# Patient Record
Sex: Female | Born: 1995
Health system: Southern US, Community
[De-identification: ages and names within clinical notes are randomized; demographics above are authoritative.]

## PROBLEM LIST (undated history)

## (undated) ENCOUNTER — Inpatient Hospital Stay (HOSPITAL_COMMUNITY): Payer: Self-pay

## (undated) DIAGNOSIS — A749 Chlamydial infection, unspecified: Secondary | ICD-10-CM

## (undated) DIAGNOSIS — G43909 Migraine, unspecified, not intractable, without status migrainosus: Secondary | ICD-10-CM

## (undated) HISTORY — DX: Chlamydial infection, unspecified: A74.9

## (undated) HISTORY — PX: APPENDECTOMY: SHX54

## (undated) HISTORY — PX: OTHER SURGICAL HISTORY: SHX169

---
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
## 2016-01-07 ENCOUNTER — Other Ambulatory Visit (HOSPITAL_COMMUNITY)
Admission: RE | Admit: 2016-01-07 | Discharge: 2016-01-07 | Disposition: A | Discharge status: Home / Self Care | Payer: Medicaid Other | Source: Ambulatory Visit | Attending: Family Medicine | Admitting: Family Medicine

## 2016-01-07 ENCOUNTER — Encounter: Payer: Self-pay | Admitting: Family Medicine

## 2016-01-07 ENCOUNTER — Ambulatory Visit (INDEPENDENT_AMBULATORY_CARE_PROVIDER_SITE_OTHER): Payer: Medicaid Other | Admitting: Family Medicine

## 2016-01-07 VITALS — Ht 69.0 in | Wt 194.0 lb

## 2016-01-07 DIAGNOSIS — N898 Other specified noninflammatory disorders of vagina: Secondary | ICD-10-CM | POA: Diagnosis not present

## 2016-01-07 DIAGNOSIS — Z113 Encounter for screening for infections with a predominantly sexual mode of transmission: Secondary | ICD-10-CM | POA: Insufficient documentation

## 2016-01-07 DIAGNOSIS — R309 Painful micturition, unspecified: Secondary | ICD-10-CM

## 2016-01-07 LAB — POCT URINALYSIS DIPSTICK
Bilirubin, UA: NEGATIVE
Glucose, UA: NEGATIVE
Ketones, UA: 15
Leukocytes, UA: NEGATIVE
NITRITE UA: NEGATIVE
Protein, UA: NEGATIVE
Spec Grav, UA: 1.025
UROBILINOGEN UA: 0.2
pH, UA: 6

## 2016-01-07 LAB — POCT UA - MICROSCOPIC ONLY

## 2016-01-07 LAB — POCT WET PREP (WET MOUNT): CLUE CELLS WET PREP WHIFF POC: NEGATIVE

## 2016-01-07 NOTE — Patient Instructions (Signed)

## 2016-01-07 NOTE — Progress Notes (Signed)
Subjective:     Patient ID: Anita [name], female   DOB: [birth year], 20 y.o.   MRN: [id number]  Vaginal Discharge The patient's primary symptoms include vaginal discharge. The patient's pertinent negatives include no genital lesions or genital odor. This is a new problem. The current episode started 1 to 4 weeks ago (1.5 wks). The problem occurs constantly. The problem has been unchanged. The pain is moderate. Pregnant now: LMP: 01/07/16. Associated symptoms include dysuria and painful intercourse. Pertinent negatives include no abdominal pain, discolored urine, fever, frequency, hematuria, nausea, urgency or vomiting. The vaginal discharge was white. There has been no bleeding (Started her period today). Nothing aggravates the symptoms. She has tried NSAIDs for the symptoms. The treatment provided mild relief. She is sexually active. Yes (Chlamydia 1 month ago), her partner has an STD. Her menstrual history has been irregular. Her past medical history is significant for an STD.   Current Outpatient Prescriptions on File Prior to Visit  Medication Sig Dispense Refill  . cetirizine (ZYRTEC) 10 MG tablet Take 1 tablet (10 mg total) by mouth daily. 30 tablet 0  . diclofenac (CATAFLAM) 50 MG tablet Take 1 tablet (50 mg total) by mouth 3 (three) times daily. 15 tablet 0  . etonogestrel (NEXPLANON) 68 MG IMPL implant 1 each by Subdermal route once.    Marland Kitchen ipratropium (ATROVENT) 0.06 % nasal spray Place 2 sprays into both nostrils 4 (four) times daily. 15 mL 1  . Pseudoeph-Doxylamine-DM-APAP (NYQUIL PO) Take by mouth.     No current facility-administered medications on file prior to visit.   Past Medical History  Diagnosis Date  . Migraine   . Migraines      Review of Systems  Constitutional: Negative for fever.  Respiratory: Negative.   Cardiovascular: Negative.   Gastrointestinal: Negative for nausea, vomiting and abdominal pain.  Genitourinary: Positive for dysuria and vaginal discharge.  Negative for urgency, frequency and hematuria.  All other systems reviewed and are negative.  Filed Vitals:   01/07/16 0956  Height: 5\' 9"  (1.753 m)  Weight: 194 lb (87.998 kg)       Objective:   Physical Exam  Constitutional: She appears well-developed. No distress.  Cardiovascular: Normal rate, regular rhythm, normal heart sounds and intact distal pulses.   No murmur heard. Pulmonary/Chest: Effort normal and breath sounds normal. No respiratory distress. She has no wheezes.  Abdominal: Soft. Bowel sounds are normal. She exhibits no distension and no mass. There is no tenderness.  Genitourinary: Vagina normal.    No labial fusion. There is no rash, tenderness, lesion or injury on the right labia. There is no rash, tenderness, lesion or injury on the left labia. Cervix exhibits no discharge.    Nursing note and vitals reviewed.  Urinalysis    Component Value Date/Time   COLORURINE YELLOW 03/15/2009 1902   APPEARANCEUR CLEAR 03/15/2009 1902   LABSPEC 1.028 03/15/2009 1902   PHURINE 6.0 03/15/2009 1902   GLUCOSEU NEGATIVE 03/15/2009 1902   HGBUR NEGATIVE 03/15/2009 1902   BILIRUBINUR NEG 01/07/2016 [place] 03/15/2009 1902   KETONESUR NEGATIVE 03/15/2009 1902   PROTEINUR NEG 01/07/2016 0951   PROTEINUR NEGATIVE 03/15/2009 1902   UROBILINOGEN 0.2 01/07/2016 0951   UROBILINOGEN 0.2 03/15/2009 1902   NITRITE NEG 01/07/2016 0951   NITRITE NEGATIVE 03/15/2009 1902   LEUKOCYTESUR Negative 01/07/2016 0951         Assessment:     Vaginitis Dysuria     Plan:     Lenard Forth  prep done and was negative for infection. GC/Chlamydia report pending. She had recent neg HIV test and RPR. UA report as above, not suggestive of infection. I recommended good GU hygiene. I will contact her as soon as I get her GC/Chlamydia report. Tylenol as needed for pain. F/U as needed.

## 2016-01-10 LAB — CERVICOVAGINAL ANCILLARY ONLY
CHLAMYDIA, DNA PROBE: NEGATIVE
NEISSERIA GONORRHEA: NEGATIVE

## 2016-01-11 ENCOUNTER — Telehealth: Payer: Self-pay | Admitting: Family Medicine

## 2016-01-11 NOTE — Telephone Encounter (Signed)
Negative GC/Chlamydia discussed with patient.

## 2016-02-01 ENCOUNTER — Ambulatory Visit (INDEPENDENT_AMBULATORY_CARE_PROVIDER_SITE_OTHER): Payer: Medicaid Other | Admitting: Family Medicine

## 2016-02-01 ENCOUNTER — Encounter: Payer: Self-pay | Admitting: Family Medicine

## 2016-02-01 VITALS — BP 132/63 | HR 72 | Temp 98.0°F | Wt 195.6 lb

## 2016-02-01 DIAGNOSIS — M545 Low back pain, unspecified: Secondary | ICD-10-CM

## 2016-02-01 DIAGNOSIS — M549 Dorsalgia, unspecified: Secondary | ICD-10-CM | POA: Insufficient documentation

## 2016-02-01 MED ORDER — NAPROXEN 500 MG PO TABS: 500.0000 mg | ORAL_TABLET | Freq: Two times a day (BID) | ORAL | Status: DC

## 2016-02-01 MED ORDER — CYCLOBENZAPRINE HCL 10 MG PO TABS: 10.0000 mg | ORAL_TABLET | Freq: Every evening | ORAL | Status: DC | PRN

## 2016-02-01 NOTE — Assessment & Plan Note (Signed)
Lumbar strain.   Plan of rest, intermittent application of cold packs (later, may switch to heat, but do not sleep on heating pad), analgesics and muscle relaxants. Discussed longer term treatment plan of prn NSAID's and home back care exercise program with flexion exercise routine.  Flexeril prescribed as well as Naprosyn  Proper lifting mechanics with avoidance of heavy lifting discussed.  Call or return to clinic prn if these symptoms worsen or fail to improve as anticipated.  Return immediately if worsening.

## 2016-02-01 NOTE — Progress Notes (Signed)
Subjective:    Anita [name] is a 20 y.o. female who presents to [facility] today for back pain:  1.  Back pain:  Present for past 2 weeks.  Started as dull ache BL lumbar region last week.  Worsened to sharp stabbing pain BL lumbar region this week.  No trauma that she knows of.  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 She thinks this is contributing or causing her back pain.  No bladder or bowel incontinence. No radiation to her legs. No radiation to buttocks. Describes pain as 3-4 out of 10. Worse when she bends forward. No fevers or chills.  Review of systems as above  The following portions of the patient's history were reviewed and updated as appropriate: allergies, current medications, past medical history, family and social history, and problem list. Patient is a nonsmoker.    PMH reviewed.  Past Medical History  Diagnosis Date  . Migraine   . Migraines    Past Surgical History  Procedure Laterality Date  . Appendectomy      Medications reviewed. Current Outpatient Prescriptions  Medication Sig Dispense Refill  . etonogestrel (NEXPLANON) 68 MG IMPL implant 1 each by Subdermal route once.    . Pseudoeph-Doxylamine-DM-APAP (NYQUIL PO) Take by mouth.    . cetirizine (ZYRTEC) 10 MG tablet Take 1 tablet (10 mg total) by mouth daily. (Patient not taking: Reported on 02/01/2016) 30 tablet 0  . diclofenac (CATAFLAM) 50 MG tablet Take 1 tablet (50 mg total) by mouth 3 (three) times daily. (Patient not taking: Reported on 02/01/2016) 15 tablet 0  . ipratropium (ATROVENT) 0.06 % nasal spray Place 2 sprays into both nostrils 4 (four) times daily. (Patient not taking: Reported on 02/01/2016) 15 mL 1   No current facility-administered medications for this visit.     Objective:   Physical Exam BP 132/63 mmHg  Pulse 72  Temp(Src) 98 F (36.7 C) (Oral)  Wt 195 lb 9.6 oz (88.724 kg) Gen:  Alert, cooperative patient who appears stated  age in no acute distress.  Vital signs reviewed. HEENT: EOMI,  MMM Back:  Normal skin, Spine with normal alignment and no deformity.  No tenderness to vertebral process palpation.  Paraspinous muscles are tender bilaterally in the lumbar region with spasm noted..   Range of motion is full at neck and but decreased forward flexion of lumbar sacral regions.  Straight leg raise is positive bilaterally for ipsilateral pain Neuro:  Sensation and motor function 5/5 bilateral lower extremities.  Patellar and Achilles  DTR's +2 patellar BL.  able to walk on heels and toes without difficulty.      No results found for this or any previous visit (from the past 72 hour(s)).

## 2016-02-01 NOTE — Patient Instructions (Addendum)
You have a back spasm, likely from the constant bending over from daycare.  Take the Naprosyn twice daily for pain relief.    You can also take the Flexeril at night to help with spasm.   Do back muscle stretches like we talked about.  You will likely have pain for another week or so and then it should start improving.  Heat, such as a heating pad, is also very helpful.   If you start having any difficulty urinating, pain when you urinate, or running to the bathroom more often make sure to let us know.

## 2016-02-02 MED ORDER — KETOROLAC TROMETHAMINE 30 MG/ML IJ SOLN: 30.0000 mg | Freq: Once | INTRAMUSCULAR | Status: AC

## 2016-02-02 NOTE — Addendum Note (Signed)
Addended by: Londell Moh T on: 02/02/2016 09:07 AM   Modules accepted: Orders

## 2016-07-07 ENCOUNTER — Ambulatory Visit (INDEPENDENT_AMBULATORY_CARE_PROVIDER_SITE_OTHER): Payer: Medicaid Other | Admitting: Internal Medicine

## 2016-07-07 ENCOUNTER — Encounter: Payer: Self-pay | Admitting: Internal Medicine

## 2016-07-07 ENCOUNTER — Other Ambulatory Visit (HOSPITAL_COMMUNITY)
Admission: RE | Admit: 2016-07-07 | Discharge: 2016-07-07 | Disposition: A | Discharge status: Home / Self Care | Payer: Medicaid Other | Source: Ambulatory Visit | Attending: Family Medicine | Admitting: Family Medicine

## 2016-07-07 VITALS — BP 110/60 | HR 79 | Temp 98.6°F | Ht 69.0 in | Wt 189.6 lb

## 2016-07-07 DIAGNOSIS — Z113 Encounter for screening for infections with a predominantly sexual mode of transmission: Secondary | ICD-10-CM | POA: Insufficient documentation

## 2016-07-07 DIAGNOSIS — N912 Amenorrhea, unspecified: Secondary | ICD-10-CM

## 2016-07-07 DIAGNOSIS — Z202 Contact with and (suspected) exposure to infections with a predominantly sexual mode of transmission: Secondary | ICD-10-CM

## 2016-07-07 DIAGNOSIS — N898 Other specified noninflammatory disorders of vagina: Secondary | ICD-10-CM | POA: Diagnosis not present

## 2016-07-07 DIAGNOSIS — R1030 Lower abdominal pain, unspecified: Secondary | ICD-10-CM

## 2016-07-07 LAB — POCT URINALYSIS DIPSTICK
BILIRUBIN UA: NEGATIVE
GLUCOSE UA: NEGATIVE
Ketones, UA: NEGATIVE
LEUKOCYTES UA: NEGATIVE
NITRITE UA: NEGATIVE
Protein, UA: NEGATIVE
Spec Grav, UA: 1.025
Urobilinogen, UA: 0.2
pH, UA: 5.5

## 2016-07-07 LAB — POCT WET PREP (WET MOUNT)
CLUE CELLS WET PREP WHIFF POC: NEGATIVE
Trichomonas Wet Prep HPF POC: ABSENT

## 2016-07-07 LAB — POCT UA - MICROSCOPIC ONLY

## 2016-07-07 LAB — POCT URINE PREGNANCY: Preg Test, Ur: NEGATIVE

## 2016-07-07 MED ORDER — FLUCONAZOLE 150 MG PO TABS: 150.0000 mg | ORAL_TABLET | Freq: Once | ORAL | 0 refills | Status: AC

## 2016-07-07 NOTE — Patient Instructions (Addendum)
Please make a lab appointment in 5 days for repeat urine pregnancy test.   Your wet prep showed that you have a yeast infection. Take one Diflucan tablet. If you are still having symptoms in 72 hours take the second tablet.   We will call you about your other results from today.   Your Nexplanon helps to prevent pregnancy but does not protect you against sexually transmitted diseases. It is very important that you use condoms when having sexual intercourse to protect against STDs.    Vaginitis Vaginitis is an inflammation of the vagina. It is most often caused by a change in the normal balance of the bacteria and yeast that live in the vagina. This change in balance causes an overgrowth of certain bacteria or yeast, which causes the inflammation. There are different types of vaginitis, but the most common types are:  Bacterial vaginosis.  Yeast infection (candidiasis).  Trichomoniasis vaginitis. This is a sexually transmitted infection (STI).  Viral vaginitis.  Atrophic vaginitis.  Allergic vaginitis. CAUSES  The cause depends on the type of vaginitis. Vaginitis can be caused by:  Bacteria (bacterial vaginosis).  Yeast (yeast infection).  A parasite (trichomoniasis vaginitis)  A virus (viral vaginitis).  Low hormone levels (atrophic vaginitis). Low hormone levels can occur during pregnancy, breastfeeding, or after menopause.  Irritants, such as bubble baths, scented tampons, and feminine sprays (allergic vaginitis). Other factors can change the normal balance of the yeast and bacteria that live in the vagina. These include:  Antibiotic medicines.  Poor hygiene.  Diaphragms, vaginal sponges, spermicides, birth control pills, and intrauterine devices (IUD).  Sexual intercourse.  Infection.  Uncontrolled diabetes.  A weakened immune system. SYMPTOMS  Symptoms can vary depending on the cause of the vaginitis. Common symptoms include:  Abnormal vaginal  discharge.  The discharge is white, gray, or yellow with bacterial vaginosis.  The discharge is thick, white, and cheesy with a yeast infection.  The discharge is frothy and yellow or greenish with trichomoniasis.  A bad vaginal odor.  The odor is fishy with bacterial vaginosis.  Vaginal itching, pain, or swelling.  Painful intercourse.  Pain or burning when urinating. Sometimes, there are no symptoms. TREATMENT  Treatment will vary depending on the type of infection.   Bacterial vaginosis and trichomoniasis are often treated with antibiotic creams or pills.  Yeast infections are often treated with antifungal medicines, such as vaginal creams or suppositories.  Viral vaginitis has no cure, but symptoms can be treated with medicines that relieve discomfort. Your sexual partner should be treated as well.  Atrophic vaginitis may be treated with an estrogen cream, pill, suppository, or vaginal ring. If vaginal dryness occurs, lubricants and moisturizing creams may help. You may be told to avoid scented soaps, sprays, or douches.  Allergic vaginitis treatment involves quitting the use of the product that is causing the problem. Vaginal creams can be used to treat the symptoms. HOME CARE INSTRUCTIONS   Take all medicines as directed by your caregiver.  Keep your genital area clean and dry. Avoid soap and only rinse the area with water.  Avoid douching. It can remove the healthy bacteria in the vagina.  Do not use tampons or have sexual intercourse until your vaginitis has been treated. Use sanitary pads while you have vaginitis.  Wipe from front to back. This avoids the spread of bacteria from the rectum to the vagina.  Let air reach your genital area.  Wear cotton underwear to decrease moisture buildup.  Avoid wearing underwear  while you sleep until your vaginitis is gone.  Avoid tight pants and underwear or nylons without a cotton panel.  Take off wet clothing  (especially bathing suits) as soon as possible.  Use mild, non-scented products. Avoid using irritants, such as:  Scented feminine sprays.  Fabric softeners.  Scented detergents.  Scented tampons.  Scented soaps or bubble baths.  Practice safe sex and use condoms. Condoms may prevent the spread of trichomoniasis and viral vaginitis. SEEK MEDICAL CARE IF:   You have abdominal pain.  You have a fever or persistent symptoms for more than 2-3 days.  You have a fever and your symptoms suddenly get worse.   This information is not intended to replace advice given to you by your health care provider. Make sure you discuss any questions you have with your health care provider.   Document Released: 08/27/2007 Document Revised: 03/16/2015 Document Reviewed: 04/11/2012 Elsevier Interactive Patient Education [employer].

## 2016-07-07 NOTE — Assessment & Plan Note (Addendum)
Wet prep obtained today and was +yeast infection. UA obtained today and was not consistent with an infection. Patient at risk for STD.  -Rx for Diflucan  -GC/Chlamydia ordered  -patient declined HIV and RPR  -discussed importance of use of condoms to prevent STDs

## 2016-07-07 NOTE — Progress Notes (Signed)
   Subjective:    Anita [name] - 20 y.o. female MRN [id number]  Date of birth: 1996-10-01  HPI  Anita [name] is here for SDA for vaginal discharge and amenorrhea.  Amenorrhea: Has Nexplanon in place. Usually has irregular periods but they do come monthly. Has not had menstrual period in the months of July and August. Had positive pregnancy test that said error 2-3 days ago. Had 3 negative tests after that, most recent negative yesterday morning. Most recent unprotected intercourse was about 2 weeks ago but unsure of exact date.   VAGINAL DISCHARGE  Having vaginal discharge for 1 days. Has associated vaginal pruritis.  Medications tried: none Discharge consistency: thick  Discharge color: white  Recent antibiotic use: no Sex in last month: yes  Possible STD exposure:unsure, has h/o +Chalmydia in the past with treatment   Symptoms Fever: no  Dysuria:yes  Vaginal bleeding: no  Abdomen or Pelvic pain: has chronic abdominal pain that is being followed by PCP--has been told might have IBS, no acute pain  Back pain: no  Genital sores or ulcers:no Rash: no  Pain during sex: no Missed menstrual period: yes    -  reports that she has never smoked. She has never used smokeless tobacco. - Review of Systems: Per HPI. - Past Medical History: Patient Active Problem List   Diagnosis Date Noted  . Amenorrhea 07/07/2016  . Back pain 02/01/2016  . Vaginal discharge 01/27/2015  . Contraception management 01/21/2014  . COMMON MIGRAINE 01/31/2007   - Medications: reviewed and updated    Objective:   Physical Exam BP 110/60 (BP Location: Right Arm, Patient Position: Sitting, Cuff Size: Normal)   Pulse 79   Temp 98.6 F (37 C) (Oral)   Ht 5\' 9"  (1.753 m)   Wt 189 lb 9.6 oz (86 kg)   SpO2 99%   BMI 28.00 kg/m  Gen: NAD, alert, cooperative with exam, well-appearing Abd: SNTND, BS present, no guarding or organomegaly GU/GYN: Exam performed in the presence of a chaperone. External  genitalia within normal limits.  Vaginal mucosa pink, moist, normal rugae.  Nonfriable cervix without lesions or bleeding noted on speculum exam. Moderate amount of white, thick discharge present in posterior fornix and at cervical os. Bimanual exam revealed normal, nongravid uterus.  No cervical motion tenderness. No adnexal masses bilaterally.       Assessment & Plan:   Amenorrhea Patient with Nexplanon in place but given recent unprotected sexual intercourse there is still possibility of pregnancy. Upreg obtained and was .  -return in 5 days for repeat Upreg  -discussed importance of not having unprotected sexual intercourse between these two pregnancy tests   Vaginal discharge Wet prep obtained today and was +yeast infection. UA obtained today and was not consistent with an infection. Patient at risk for STD.  -Rx for Diflucan  -GC/Chlamydia ordered  -patient declined HIV and RPR  -discussed importance of use of condoms to prevent STDs    Phill Myron, D.O. 07/07/2016, 11:00 AM PGY-2, [place]

## 2016-07-07 NOTE — Assessment & Plan Note (Signed)
Patient with Nexplanon in place but given recent unprotected sexual intercourse there is still possibility of pregnancy. Upreg obtained and was .  -return in 5 days for repeat Upreg  -discussed importance of not having unprotected sexual intercourse between these two pregnancy tests

## 2016-07-10 LAB — CERVICOVAGINAL ANCILLARY ONLY
Chlamydia: NEGATIVE
NEISSERIA GONORRHEA: NEGATIVE

## 2016-07-11 ENCOUNTER — Telehealth: Payer: Self-pay | Admitting: *Deleted

## 2016-07-11 NOTE — Telephone Encounter (Signed)
-----   Message from Nicolette Bang, DO sent at 07/11/2016  8:45 AM EDT ----- Please call patient to let her know that Gonorrhea and Chlamydia tests were negative.   Phill Myron, D.O. 07/11/2016, 8:45 AM PGY-2, [place]

## 2016-07-11 NOTE — Telephone Encounter (Signed)
Pt returned call, was informed.  Fleeger, Salome Spotted, CMA

## 2016-07-11 NOTE — Telephone Encounter (Signed)
LM with mom to have pt call back . Mayelin Panos, Salome Spotted, CMA

## 2016-07-13 ENCOUNTER — Telehealth: Payer: Self-pay | Admitting: Family Medicine

## 2016-07-13 NOTE — Telephone Encounter (Signed)
Has an appt tomorrow but has some questions.

## 2016-07-13 NOTE — Telephone Encounter (Signed)
Tried calling pt no answer and no option for VM 

## 2016-07-14 ENCOUNTER — Ambulatory Visit: Payer: Medicaid Other | Admitting: Family Medicine

## 2016-07-14 NOTE — Telephone Encounter (Signed)
Pt never called back nor did she show up for her apt with Dr. Jerline Pain.

## 2016-09-11 ENCOUNTER — Encounter: Payer: Self-pay | Admitting: Student

## 2016-09-11 ENCOUNTER — Ambulatory Visit (INDEPENDENT_AMBULATORY_CARE_PROVIDER_SITE_OTHER): Payer: Medicaid Other | Admitting: Student

## 2016-09-11 ENCOUNTER — Other Ambulatory Visit (HOSPITAL_COMMUNITY)
Admission: RE | Admit: 2016-09-11 | Discharge: 2016-09-11 | Disposition: A | Discharge status: Home / Self Care | Payer: Medicaid Other | Source: Ambulatory Visit | Attending: Family Medicine | Admitting: Family Medicine

## 2016-09-11 VITALS — BP 133/53 | HR 72 | Temp 98.2°F | Ht 69.0 in | Wt 194.2 lb

## 2016-09-11 DIAGNOSIS — B3731 Acute candidiasis of vulva and vagina: Secondary | ICD-10-CM

## 2016-09-11 DIAGNOSIS — B9789 Other viral agents as the cause of diseases classified elsewhere: Secondary | ICD-10-CM

## 2016-09-11 DIAGNOSIS — R300 Dysuria: Secondary | ICD-10-CM | POA: Diagnosis not present

## 2016-09-11 DIAGNOSIS — B373 Candidiasis of vulva and vagina: Secondary | ICD-10-CM | POA: Diagnosis not present

## 2016-09-11 DIAGNOSIS — N912 Amenorrhea, unspecified: Secondary | ICD-10-CM

## 2016-09-11 DIAGNOSIS — J069 Acute upper respiratory infection, unspecified: Secondary | ICD-10-CM | POA: Diagnosis not present

## 2016-09-11 DIAGNOSIS — N898 Other specified noninflammatory disorders of vagina: Secondary | ICD-10-CM

## 2016-09-11 DIAGNOSIS — Z113 Encounter for screening for infections with a predominantly sexual mode of transmission: Secondary | ICD-10-CM | POA: Insufficient documentation

## 2016-09-11 LAB — POCT URINALYSIS DIPSTICK
Bilirubin, UA: NEGATIVE
Glucose, UA: NEGATIVE
Ketones, UA: NEGATIVE
Leukocytes, UA: NEGATIVE
NITRITE UA: NEGATIVE
PH UA: 5.5
PROTEIN UA: NEGATIVE
UROBILINOGEN UA: 0.2

## 2016-09-11 LAB — POCT WET PREP (WET MOUNT)
CLUE CELLS WET PREP WHIFF POC: NEGATIVE
TRICHOMONAS WET PREP HPF POC: ABSENT

## 2016-09-11 MED ORDER — FLUCONAZOLE 150 MG PO TABS: 150.0000 mg | ORAL_TABLET | Freq: Once | ORAL | 2 refills | Status: AC

## 2016-09-11 NOTE — Patient Instructions (Addendum)
It was great seeing you today! We have addressed the following issues today  1. Vaginal discharge: Your test is remarkable for yeast infection. I have sent a prescription for Diflucan to your pharmacy. I also gave you some refills in case you have a recurrence of these symptoms in the future. Someone will get in touch with you when your other results come out.    2.   Sore throat: it appears that you have a viral upper respiratory infection (cold).  Cold symptoms can last up to 2 weeks.    - Get plenty of rest and adequate hydration. - Consume warm fluids (soup or tea) to provide relief for a stuffy nose and to loosen phlegm. - For nasal stuffiness, try saline nasal spray or a Neti Pot. - For sore throat pain relief: suck on throat lozenges, hard candy or popsicles; gargle with warm salt water (1/4 tsp. salt per 8 oz. of water); and eat soft, bland foods. - Eat a well-balanced diet. If you cannot, ensure you are getting enough nutrients by taking a daily multivitamin. - Avoid dairy products, as they can thicken phlegm. - Avoid alcohol, as it impairs your body's immune system.  CONTACT YOUR DOCTOR IF YOU EXPERIENCE ANY OF THE FOLLOWING: - High fever, chest pain, shortness of breath or  not able to keep down food or fluids.  - Cough that gets worse while other cold symptoms improve - Flare up of any chronic lung problem, such as asthma - Your symptoms persist longer than 2 weeksThis is likely common cold. Common cold is a viral infection that should resolve on its own. (See below for more)   Take Care,

## 2016-09-11 NOTE — Progress Notes (Signed)
   Subjective:    Patient ID: Anita [name] is a 20 y.o. old female.   Patient here to discuss yeast infection and sore throat.   HPI #Yeast infection: Reports whitish discharge and itching for one week. Denies smell or odor to it. Reports dysuria at the beginning and ending of her urination. Denies vaginal bleeding, fever, chills, abdominal pain or back pain. Denies hematuria, increased frequency or urge of urination. Denies antibiotic use or new cosmetics recently. Sexually active with one female sexual partner. The same sexual partner over the last 12 months. Denies condom use. She has Nexplanon for birth control.  Had yeast infection two months ago and was treated with Diflucan   #Sore throat: for three days. Reports runny nose, congestion and cough. Cough is productive with greenish phlegm. Denies hemoptysis, fever, chills, skin rash, chest pain or shortness of breath. She works in a day care.   PMH: Treated for yeast infection 2 months ago.    SH: denies smoking cigarettes, using alcohol or recreational drug use  Review of Systems Per HPI Objective:   Vitals:   09/11/16 1121  BP: (!) 133/53  Pulse: 72  Temp: 98.2 F (36.8 C)  TempSrc: Oral  SpO2: 100%  Weight: 194 lb 3.2 oz (88.1 kg)  Height: 5\' 9"  (1.753 m)    GEN: appears well, no apparent distress. Eyes: without conjunctival injection, sclera anicteric Ears: normal TM and ear canal,  Nares: Positive for rhinorrhea, congestion or erythema,  Oropharynx: mmm without erythema or exudation HEM: Negative for cervical and periauricular lymphadenopathy CVS: RRR, normal s1 and s2, no murmurs, no edema, cap refills < 2 sec RESP: no increased work of breathing, good air movement bilaterally, no crackles or wheeze GI: Bowel sounds present and normal, soft, non-tender,non-distended GU: External genitalia without lesion. Speculum exam with some sparse whitish discharge, no apparent lesion of vaginal wall or cervix. No vaginal  bleeding. Bimanual exam negative for cervical motion tenderness or adnexal mass bilaterally. However, she is tender to palpation over lower abdomin bilaterally. Negative for CVA tenderness.  SKIN: No apparent lesion NEURO: alert and oriented appropriately, no gross defecits  PSYCH: appropriate mood and affect     Assessment & Plan:  Vaginal discharge Wet prep remarkable for yeast infection. Gave prescription for Diflucan.  On my exam, patient has some tenderness to palpation over lower abdomen bilaterally. However, she has no cervical motion tenderness. Speculum exam was remarkable for sparce whitish discharge but no obvious discharge from cervical os. She has no systemic symptoms to suggest PID. Urinalysis was negative. She has no CVA tenderness either.  She could have  ovarian cyst.  -GC/CT/trich today.   Viral URI with cough History and exam suggestive for viral URI. She has some rhinorrhea with congestion and erythema of nasal turbinates. Otherwise, cardiopulmonary exams are within normal limit. Discussed conservative management a return precautions as in AVS.

## 2016-09-12 DIAGNOSIS — B9789 Other viral agents as the cause of diseases classified elsewhere: Secondary | ICD-10-CM

## 2016-09-12 DIAGNOSIS — J069 Acute upper respiratory infection, unspecified: Secondary | ICD-10-CM | POA: Insufficient documentation

## 2016-09-12 LAB — CERVICOVAGINAL ANCILLARY ONLY
CHLAMYDIA, DNA PROBE: POSITIVE — AB
NEISSERIA GONORRHEA: NEGATIVE
Trichomonas: NEGATIVE

## 2016-09-12 NOTE — Assessment & Plan Note (Signed)
History and exam suggestive for viral URI. She has some rhinorrhea with congestion and erythema of nasal turbinates. Otherwise, cardiopulmonary exams are within normal limit. Discussed conservative management a return precautions as in AVS.

## 2016-09-12 NOTE — Assessment & Plan Note (Addendum)
Wet prep remarkable for yeast infection. Gave prescription for Diflucan.  On my exam, patient has some tenderness to palpation over lower abdomen bilaterally. However, she has no cervical motion tenderness. Speculum exam was remarkable for sparce whitish discharge but no obvious discharge from cervical os. She has no systemic symptoms to suggest PID. Urinalysis was negative. She has no CVA tenderness either.  She could have  ovarian cyst.  -GC/CT/trich today.

## 2016-09-13 ENCOUNTER — Telehealth: Payer: Self-pay | Admitting: *Deleted

## 2016-09-13 DIAGNOSIS — A749 Chlamydial infection, unspecified: Secondary | ICD-10-CM

## 2016-09-13 MED ORDER — AZITHROMYCIN 250 MG PO TABS: 1000.0000 mg | ORAL_TABLET | Freq: Once | ORAL | 0 refills | Status: AC

## 2016-09-13 NOTE — Telephone Encounter (Signed)
Called the patient's using the number listed as a primary phone number [phone]). Patient's mother picked up the phone. I asked if [name] is available. Patient's mother said this is [name]. Then I started discussing about her results from her recent visit with me which was positive for chlamydia. At this point, patient's mother stated that this is her daughter's result not hers. She advised me to call her daughter on her cell phone.  I apologized about the incident and hang up the phone.   I discussed this matter with Dr. Ardelia Mems, who was the preceptor for the day. Tomasa Hosteller was not in the office so we discussed the matter with [name].   After this, I called patient and discussed about her test result and treatment plan. I gave her the options of sending prescription to the pharmacy or coming to the clinic for treatment. She preferred I send the prescription to the pharmacy. Accordingly I sent a prescription for azithromycin 1 g to her pharmacy. I also discussed about the importance of partner treatment and complete STD screening including HIV. I also recommend repeating test in 3 months. Patient voiced understanding and agreed to do as advised.   After discussing the treatment plan, I discussed about the incidence above. She stated that her mother has already called her and talked to her about her test result. I explained how the incident happened and apologized. She accepted the apology and asked me to take the home phone number [phone]) out of the system. I deleted the phone number as requested by the patient.

## 2016-09-13 NOTE — Telephone Encounter (Signed)
Patient was returning call from provider regarding test results.  I didn't inform patient of anything until I checked with provider on what the treatment plan is for her.  Will forward to MD to advise and then patient is aware that I will call her back. Kelsey Edman,CMA

## 2016-10-13 ENCOUNTER — Other Ambulatory Visit (HOSPITAL_COMMUNITY)
Admission: RE | Admit: 2016-10-13 | Discharge: 2016-10-13 | Disposition: A | Discharge status: Home / Self Care | Payer: Medicaid Other | Source: Ambulatory Visit | Attending: Family Medicine | Admitting: Family Medicine

## 2016-10-13 ENCOUNTER — Ambulatory Visit (INDEPENDENT_AMBULATORY_CARE_PROVIDER_SITE_OTHER): Payer: Self-pay | Admitting: Family Medicine

## 2016-10-13 VITALS — BP 113/65 | HR 64 | Temp 98.2°F | Ht 69.0 in | Wt 186.6 lb

## 2016-10-13 DIAGNOSIS — Z113 Encounter for screening for infections with a predominantly sexual mode of transmission: Secondary | ICD-10-CM | POA: Insufficient documentation

## 2016-10-13 DIAGNOSIS — N898 Other specified noninflammatory disorders of vagina: Secondary | ICD-10-CM

## 2016-10-13 LAB — POCT WET PREP (WET MOUNT)
CLUE CELLS WET PREP WHIFF POC: NEGATIVE
Trichomonas Wet Prep HPF POC: ABSENT

## 2016-10-13 NOTE — Progress Notes (Signed)
VAGINAL DISCHARGE Persistent discharge. Recent Chlamydia infection with appropriate treatment. Unfortunately she has developed new discharge. DC is lighter, no odor.   Having vaginal discharge for 14 days. Discharge is: lighter; white; thin Sex in last month: yes Using barrier protection (condoms): yes; now using condoms (wasn't before) Possible STD exposure: yes Personal history of vaginal infection: yes Family history of uterine or vaginal cancer: no  Recent antibiotic use: yes; azithro  Symptoms Vaginal itching: no Dysuria:no Dyspareunia: no Genital sores or ulcers: no Hematuria: no Flank pain: no Weight loss: no Weight gain: no Trouble with vision: no Headaches: no Abdomen or pelvic pain: no Back pain: no   ROS see HPI Smoking Status noted  CC, SH/smoking status, and VS noted  Objective: BP 113/65 (BP Location: Left Arm, Patient Position: Sitting, Cuff Size: Normal)   Pulse 64   Temp 98.2 F (36.8 C) (Oral)   Ht 5\' 9"  (1.753 m)   Wt 186 lb 9.6 oz (84.6 kg)   LMP 09/20/2016 (Exact Date)   SpO2 100%   BMI 27.56 kg/m  Gen: NAD, alert, cooperative. CV: Well-perfused. Resp: Non-labored. Neuro: Sensation intact throughout. Female genitalia: normal external genitalia, vulva, vagina, cervix, uterus and adnexa Clear milky discharge noted from cervical os, no odor appreciated.   Assessment and plan:  Vaginal discharge Patient is here with vaginal discharge. Etiology currently unknown. She was recently treated with azithromycin for positive chlamydia. Wet prep today negative for Trichomonas, BV, or yeast. - GC/chlamydia pending - We'll wait for results from GC/chlamydia test; I provided reassurance at this time in case these results came back negative (which is what I suspect) - Discussed safe practices with intercourse as patient states she has had 3 previous positive chlamydia tests.   Orders Placed This Encounter  Procedures  . POCT Wet Prep [facility])     Elberta Leatherwood, MD,MS,  PGY3 10/13/2016 6:37 PM

## 2016-10-13 NOTE — Assessment & Plan Note (Addendum)
Patient is here with vaginal discharge. Etiology currently unknown. She was recently treated with azithromycin for positive chlamydia. Wet prep today negative for Trichomonas, BV, or yeast. - GC/chlamydia pending - We'll wait for results from GC/chlamydia test; I provided reassurance at this time in case these results came back negative (which is what I suspect) - Discussed safe practices with intercourse as patient states she has had 3 previous positive chlamydia tests.

## 2016-10-13 NOTE — Patient Instructions (Signed)
It was a pleasure seeing you today in our clinic. Today we discussed your vaginal discharge. Here is the treatment plan we have discussed and agreed upon together:   - At this point I do not see any positive results on your wet prep. This means there is no evidence of Yeast infection, bacterial vaginosis, or trichomonas. - Our gonorrhea/chlamydia test is still pending at this time. These results should be available first thing Monday or Tuesday next week. - If you have any questions do not hesitate to call.

## 2016-10-16 LAB — CERVICOVAGINAL ANCILLARY ONLY
Chlamydia: NEGATIVE
Neisseria Gonorrhea: NEGATIVE

## 2016-10-18 ENCOUNTER — Telehealth: Payer: Self-pay | Admitting: Family Medicine

## 2016-10-18 NOTE — Telephone Encounter (Signed)
Pt called and would like to have her results and also find out what the next step is. Anita [name]

## 2016-10-18 NOTE — Telephone Encounter (Signed)
Will forward to Dr. Alease Frame who recently saw patient. Jazmin Hartsell,CMA

## 2016-10-19 NOTE — Telephone Encounter (Signed)
Called patient, got voicemail. Due to the sensitive nature of the test results I opted not to leave a voicemail.  Can someone please call patient to inform her of her negative GC/Chlamydia? I had informed her that I would contact her personally if these results were positive and she would not hear from me if they were negative as she had had an unfortunate experience in which someone had discussed her test results with her mother instead of her previously.  Please make sure you're talking to the patient not her mother.

## 2016-10-23 NOTE — Telephone Encounter (Signed)
Pt states that she is still having discomfort, although her STD testing was negative .  She is ok with making another appt, but wants the MD advice first.  [place], Salome Spotted, [place]

## 2016-10-23 NOTE — Telephone Encounter (Signed)
Patient is aware of results and states that she has already spoken with someone. Anita [name],CMA

## 2016-10-24 NOTE — Telephone Encounter (Signed)
I have only met this patient once. I am not her PCP. If she has continued pain that is bothersome then she can get reevaluated. Unfortunately, that's about as far as my knowledge of this patient's issues extends. Sorry!

## 2016-10-24 NOTE — Telephone Encounter (Signed)
Pt informed, she requested to be seen By Dr. Alease Frame again. Appt made for Friday. Yvette Loveless, Salome Spotted, CMA

## 2016-10-27 ENCOUNTER — Ambulatory Visit: Payer: Medicaid Other | Admitting: Family Medicine

## 2016-11-24 ENCOUNTER — Ambulatory Visit (INDEPENDENT_AMBULATORY_CARE_PROVIDER_SITE_OTHER): Payer: BLUE CROSS/BLUE SHIELD | Admitting: Women's Health

## 2016-11-24 ENCOUNTER — Encounter: Payer: Self-pay | Admitting: Women's Health

## 2016-11-24 VITALS — BP 118/76 | Ht 68.0 in | Wt 188.0 lb

## 2016-11-24 DIAGNOSIS — Z23 Encounter for immunization: Secondary | ICD-10-CM | POA: Diagnosis not present

## 2016-11-24 DIAGNOSIS — N898 Other specified noninflammatory disorders of vagina: Secondary | ICD-10-CM | POA: Diagnosis not present

## 2016-11-24 DIAGNOSIS — Z113 Encounter for screening for infections with a predominantly sexual mode of transmission: Secondary | ICD-10-CM

## 2016-11-24 DIAGNOSIS — Z01419 Encounter for gynecological examination (general) (routine) without abnormal findings: Secondary | ICD-10-CM

## 2016-11-24 LAB — URINALYSIS W MICROSCOPIC + REFLEX CULTURE
BACTERIA UA: NONE SEEN [HPF]
BILIRUBIN URINE: NEGATIVE
CASTS: NONE SEEN [LPF]
CRYSTALS: NONE SEEN [HPF]
Glucose, UA: NEGATIVE
Ketones, ur: NEGATIVE
Leukocytes, UA: NEGATIVE
Nitrite: NEGATIVE
PROTEIN: NEGATIVE
SPECIFIC GRAVITY, URINE: 1.023 (ref 1.001–1.035)
WBC, UA: NONE SEEN WBC/HPF (ref <=5)
YEAST: NONE SEEN [HPF]
pH: 6.5 (ref 5.0–8.0)

## 2016-11-24 LAB — CBC WITH DIFFERENTIAL/PLATELET
BASOS PCT: 1 %
Basophils Absolute: 49 cells/uL (ref 0–200)
EOS PCT: 3 %
Eosinophils Absolute: 147 cells/uL (ref 15–500)
HCT: 41.7 % (ref 35.0–45.0)
Hemoglobin: 13.8 g/dL (ref 11.7–15.5)
LYMPHS PCT: 33 %
Lymphs Abs: 1617 cells/uL (ref 850–3900)
MCH: 28.4 pg (ref 27.0–33.0)
MCHC: 33.1 g/dL (ref 32.0–36.0)
MCV: 85.8 fL (ref 80.0–100.0)
MONOS PCT: 11 %
MPV: 9.3 fL (ref 7.5–12.5)
Monocytes Absolute: 539 cells/uL (ref 200–950)
NEUTROS ABS: 2548 {cells}/uL (ref 1500–7800)
Neutrophils Relative %: 52 %
PLATELETS: 289 10*3/uL (ref 140–400)
RBC: 4.86 MIL/uL (ref 3.80–5.10)
RDW: 14.3 % (ref 11.0–15.0)
WBC: 4.9 10*3/uL (ref 3.8–10.8)

## 2016-11-24 LAB — WET PREP FOR TRICH, YEAST, CLUE
CLUE CELLS WET PREP: NONE SEEN
Trich, Wet Prep: NONE SEEN
YEAST WET PREP: NONE SEEN

## 2016-11-24 NOTE — Progress Notes (Signed)
Anita [name] 27-Nov-1995 [id number]    History:    Presents for annual exam and STI testing.  Experiencing yellow chunky discharge, white at onset of symptoms.  Mild dryness, cramping itching, and burning sensation, constant, One positive Chlamydia test each year for the last three years.  Engaged vaginal and oral sex with new partner since test to cure (12/17). Nexplanon placed at other office 10/15, light irregular spotting. Gardisil immunization completed      Past medical history, past surgical history, family history and social history were all reviewed and documented in the EPIC chart. No reports of migraines and back pain, discontinued Flexeril 10 mg PRN and naproxen 500 mg twice daily. Surgical history include appendectomy. No known allergies. 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 Currently not sexual active.  No longer dating boyfriend and other previous sexual partners.  No significant family history.  No smoking, alcohol, or drug use.  ROS:  A ROS was performed and pertinent positives and negatives are included. Neck: No dysphagia, fullness, or pain Cardiac: No chest pain, or palpitations  Respiratory: No dyspena, or SOB Abdomen: Cramping, No pelvic pain or bloating Genitourinary: Mild burning, itching, dryness, yellow chunky discharge; No dysuria or dyspareunia   Exam:  Vitals:   11/24/16 0835  BP: 118/76  Weight: 188 lb (85.3 kg)  Height: 5\' 8"  (1.727 m)   Body mass index is 28.59 kg/m.   General appearance:  Normal, Appeared concerned about vaginal symptoms and fertility in the future Thyroid:  Symmetrical, normal in size, without palpable masses or nodularity. Respiratory  Auscultation:  Clear without wheezing or rhonchi Cardiovascular  Auscultation:  Regular rate, without rubs, murmurs or gallops  Edema/varicosities:  Not grossly evident Abdominal  Soft,nontender, without masses, guarding or rebound.  Liver/spleen:  No organomegaly  noted  Hernia:  None appreciated  Skin  Inspection:  Grossly normal   Breasts: Examined lying and sitting.     Right: Without masses, retractions, discharge or axillary adenopathy.     Left: Without masses, retractions, discharge or axillary adenopathy. Gentitourinary   Inguinal/mons:  Normal without inguinal adenopathy  External genitalia:  Normal  BUS/Urethra/Skene's glands:  Normal  Vagina:  Normal, minimal milky white discharge  Cervix:  Normal  Uterus:  Anteverted, normal in size, shape and contour.  Midline and mobile  Adnexa/parametria:     Rt: Without masses or tenderness.   Lt: Without masses or tenderness.  Anus and perineum: Normal  Digital rectal exam: Normal sphincter tone without palpated masses or tenderness  Assessment/Plan:  21 y.o. SF G0  for annual exam with complaint of discharge.    08/2014 Nexplanon with irregular bleeding STD screen   Wet prep - Negative  Plan:  Discussed normality of wet prep and exam no medication given. Reviewed safe sexual practices and condom use.  Discussed campus safety.  No SBE's, educated on home breast prevention practices. Provided guidelines for nutrition and exercise, encouraged initiating physical active 30 minutes 3 times per week. Advocated calcium rich diet and reduced intake of fast food and soda. Explored the pathophysiology of Chlamydia and its effects on fertility.  Administer influenza vaccine. Aware Nexplanon is good for 3 years, replace 08/2017, instructed to call office. CBC, UA; RPR, Hep B/C,GC/ Chlamydia and HIV testing pending.    Huel Cote [facility], 9:20 AM 11/24/2016

## 2016-11-24 NOTE — Patient Instructions (Addendum)

## 2016-11-25 LAB — GC/CHLAMYDIA PROBE AMP
CT Probe RNA: NOT DETECTED
GC PROBE AMP APTIMA: NOT DETECTED

## 2016-11-25 LAB — RPR

## 2016-11-25 LAB — HEPATITIS B SURFACE ANTIGEN: HEP B S AG: NEGATIVE

## 2016-11-25 LAB — URINE CULTURE

## 2016-11-25 LAB — HIV ANTIBODY (ROUTINE TESTING W REFLEX): HIV 1&2 Ab, 4th Generation: NONREACTIVE

## 2016-11-25 LAB — HEPATITIS C ANTIBODY: HCV AB: NEGATIVE

## 2016-11-27 ENCOUNTER — Telehealth: Payer: Self-pay | Admitting: *Deleted

## 2016-11-27 NOTE — Telephone Encounter (Signed)
TC will try OTC A&D cream ext, loose clothes, ect call if no relief.

## 2016-11-27 NOTE — Telephone Encounter (Signed)
(  pt aware you are out of the office) Pt informed with negative STD screening 11/24/16 states she is having labia burning, no itching, no discharge, very irritated. Please advise

## 2016-11-30 ENCOUNTER — Ambulatory Visit: Payer: Self-pay | Admitting: Women's Health

## 2016-12-11 ENCOUNTER — Encounter: Payer: Self-pay | Admitting: Internal Medicine

## 2016-12-11 ENCOUNTER — Ambulatory Visit (INDEPENDENT_AMBULATORY_CARE_PROVIDER_SITE_OTHER): Payer: BLUE CROSS/BLUE SHIELD | Admitting: Internal Medicine

## 2016-12-11 VITALS — BP 110/64 | HR 86 | Temp 98.6°F | Wt 188.0 lb

## 2016-12-11 DIAGNOSIS — J069 Acute upper respiratory infection, unspecified: Secondary | ICD-10-CM

## 2016-12-11 DIAGNOSIS — B9789 Other viral agents as the cause of diseases classified elsewhere: Secondary | ICD-10-CM | POA: Diagnosis not present

## 2016-12-11 MED ORDER — IPRATROPIUM BROMIDE 0.06 % NA SOLN: 2.0000 | Freq: Four times a day (QID) | NASAL | 0 refills | Status: DC

## 2016-12-11 MED ORDER — BENZONATATE 100 MG PO CAPS: 100.0000 mg | ORAL_CAPSULE | Freq: Three times a day (TID) | ORAL | 0 refills | Status: DC | PRN

## 2016-12-11 NOTE — Progress Notes (Signed)
   [facility] Phone: [phone]   Date of Visit: 12/11/2016   HPI:  Anita [name] is a 21 y.o. female presenting to clinic today for same day appointment. PCP: Lind Covert, MD Concerns today include: Sinus and cough, sore thraot.  - symptoms began Saturday morning with sore throat with cough; on Sunday, she also started having sinus pressure and pain with rhinorhea  - did get flu shot this season  - no fevers, no shortness of breath  - some back aches in her lower back but no myalgias  - sick contacts with flu like symptoms  - has tried Thearflu which does not help, and Nysquil - no history of smoking  ROS: See HPI.  [place]:  PMH: Migraine  PHYSICAL EXAM: BP 110/64   Pulse 86   Temp 98.6 F (37 C) (Oral)   Wt 188 lb (85.3 kg)   LMP 11/10/2016   SpO2 96%   BMI 28.59 kg/m  GEN: NAD HEENT: Atraumatic, normocephalic, neck supple, EOMI, sclera clear, TMs normal, pharynx with mild erythema without exudates CV: RRR, no murmurs, rubs, or gallops PULM: CTAB, normal effort SKIN: No rash or cyanosis; warm and well-perfused PSYCH: Mood and affect euthymic, normal rate and volume of speech NEURO: Awake, alert, normal speech   ASSESSMENT/PLAN:  1. Viral URI with cough Symptomatic care discussed. Atrovent nasal spray PRN and Tessalon PRN. Return precautions discussed.   Smiley Houseman, MD PGY [place]

## 2016-12-11 NOTE — Patient Instructions (Addendum)
Please try Atrovent nasal spray for your symptoms and Tessalon for cough as needed.   Upper Respiratory Infection, Adult Most upper respiratory infections (URIs) are caused by a virus. A URI affects the nose, throat, and upper air passages. The most common type of URI is often called "the common cold." Follow these instructions at home:  Take medicines only as told by your doctor.  Gargle warm saltwater or take cough drops to comfort your throat as told by your doctor.  Use a warm mist humidifier or inhale steam from a shower to increase air moisture. This may make it easier to breathe.  Drink enough fluid to keep your pee (urine) clear or pale yellow.  Eat soups and other clear broths.  Have a healthy diet.  Rest as needed.  Go back to work when your fever is gone or your doctor says it is okay.  You may need to stay home longer to avoid giving your URI to others.  You can also wear a face mask and wash your hands often to prevent spread of the virus.  Use your inhaler more if you have asthma.  Do not use any tobacco products, including cigarettes, chewing tobacco, or electronic cigarettes. If you need help quitting, ask your doctor. Contact a doctor if:  You are getting worse, not better.  Your symptoms are not helped by medicine.  You have chills.  You are getting more short of breath.  You have brown or red mucus.  You have yellow or brown discharge from your nose.  You have pain in your face, especially when you bend forward.  You have a fever.  You have puffy (swollen) neck glands.  You have pain while swallowing.  You have white areas in the back of your throat. Get help right away if:  You have very bad or constant:  Headache.  Ear pain.  Pain in your forehead, behind your eyes, and over your cheekbones (sinus pain).  Chest pain.  You have long-lasting (chronic) lung disease and any of the following:  Wheezing.  Long-lasting  cough.  Coughing up blood.  A change in your usual mucus.  You have a stiff neck.  You have changes in your:  Vision.  Hearing.  Thinking.  Mood. This information is not intended to replace advice given to you by your health care provider. Make sure you discuss any questions you have with your health care provider. Document Released: 04/17/2008 Document Revised: 07/02/2016 Document Reviewed: 02/04/2014 Elsevier Interactive Patient Education  2017 [employer].

## 2016-12-12 ENCOUNTER — Ambulatory Visit (INDEPENDENT_AMBULATORY_CARE_PROVIDER_SITE_OTHER): Payer: BLUE CROSS/BLUE SHIELD | Admitting: Internal Medicine

## 2016-12-12 ENCOUNTER — Telehealth: Payer: Self-pay | Admitting: *Deleted

## 2016-12-12 ENCOUNTER — Encounter: Payer: Self-pay | Admitting: Internal Medicine

## 2016-12-12 VITALS — BP 110/78 | HR 86 | Temp 97.9°F | Ht 68.0 in | Wt 185.6 lb

## 2016-12-12 DIAGNOSIS — B9789 Other viral agents as the cause of diseases classified elsewhere: Secondary | ICD-10-CM

## 2016-12-12 DIAGNOSIS — J069 Acute upper respiratory infection, unspecified: Secondary | ICD-10-CM | POA: Diagnosis not present

## 2016-12-12 LAB — INFLUENZA PANEL BY PCR (TYPE A & B)
INFLBPCR: NEGATIVE
Influenza A By PCR: POSITIVE — AB

## 2016-12-12 NOTE — Assessment & Plan Note (Signed)
Pt just seen in clinic yesterday. Presenting with same symptoms, but developed fever to 101.4 this morning, which came down with Ibuprofen. No signs of bacterial infection on exam today. Pt states that her work would like for her to be tested for the flu because that will determine when she is allowed to come back to work. She works at a daycare. - Influenza PCR ordered - I will call patient with the results - Do not think patient needs Tamiflu, because she has already been having symptoms for 4 days - Continue symptomatic care - Follow-up in 1-2 weeks if not feeling better.

## 2016-12-12 NOTE — Progress Notes (Signed)
   [facility] Phone: [phone]  Subjective:  Anita [name] is a 21 year old female presenting to clinic with congestion, cough, sore throat, and muscle aches starting 3-4 days ago. She was seen in clinic yesterday and was diagnosed with a viral URI. She states she was told to come back to clinic if she develops a fever. She had a fever this morning to 101.4. She took Ibuprofen and the fever came down. She endorses nausea, but denies vomiting or diarrhea. 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 She is eating and drinking like normal. She has been taking Ibuprofen, Tylenol, Nyquil, tessalon, and Atrovent nose spray.  ROS: See HPI for pertinent positives and negatives  Past Medical History- migraines  Family history reviewed for today's visit. No changes.  Social history- patient is a never smoker  Objective: BP 110/78 (BP Location: Right Arm, Patient Position: Sitting, Cuff Size: Normal)   Pulse 86   Temp 97.9 F (36.6 C) (Oral)   Ht 5\' 8"  (1.727 m)   Wt 185 lb 9.6 oz (84.2 kg)   LMP 11/10/2016 (Exact Date)   SpO2 99%   BMI 28.22 kg/m  Gen: NAD, alert, cooperative with exam HEENT: NCAT, EOMI, MMM, TMs clear, oropharynx mildly erythematous Neck: Supple, no cervical lymphadenopathy Resp: Normal work of breathing, CTAB CV: RRR, no murmurs  Assessment/Plan: Viral URI w/ cough: Pt just seen in clinic yesterday. Presenting with same symptoms, but developed fever to 101.4 this morning, which came down with Ibuprofen. No signs of bacterial infection on exam today. Pt states that her work would like for her to be tested for the flu because that will determine when she is allowed to come back to work. She works at a daycare. - Influenza PCR ordered - I will call patient with the results - Do not think patient needs Tamiflu, because she has already been having  symptoms for 4 days - Continue symptomatic care - Follow-up in 1-2 weeks if not feeling better.   Hyman Bible, MD PGY-2

## 2016-12-12 NOTE — Telephone Encounter (Signed)
Patient made aware and results faxed to mother per patient request. Anita [name]

## 2016-12-12 NOTE — Patient Instructions (Signed)
It was so nice to meet you!  We have sent a flu test. We should hear back today. I will call you with the results.  You should continue to use Ibuprofen and Tylenol as needed for fever or discomfort. You may continue to spike fevers for the next couple of days. Please come back to see [place] if you are not feeling better in the next 1-2 weeks.  -Dr. Brett Albino

## 2016-12-12 NOTE — Telephone Encounter (Signed)
-----   Message from Sela Hua, MD sent at 12/12/2016  2:10 PM EST ----- Please let patient know that her flu test was positive. She should make sure she is staying well hydrated.

## 2016-12-29 ENCOUNTER — Encounter: Payer: Self-pay | Admitting: Internal Medicine

## 2016-12-29 ENCOUNTER — Ambulatory Visit (INDEPENDENT_AMBULATORY_CARE_PROVIDER_SITE_OTHER): Payer: BLUE CROSS/BLUE SHIELD | Admitting: Internal Medicine

## 2016-12-29 VITALS — BP 118/70 | HR 81 | Temp 97.9°F | Ht 68.0 in | Wt 188.4 lb

## 2016-12-29 DIAGNOSIS — N390 Urinary tract infection, site not specified: Secondary | ICD-10-CM | POA: Insufficient documentation

## 2016-12-29 DIAGNOSIS — R300 Dysuria: Secondary | ICD-10-CM

## 2016-12-29 LAB — POCT URINALYSIS DIPSTICK
Bilirubin, UA: NEGATIVE
Glucose, UA: NEGATIVE
Ketones, UA: NEGATIVE
NITRITE UA: NEGATIVE
PROTEIN UA: 100
SPEC GRAV UA: 1.025
UROBILINOGEN UA: 0.2
pH, UA: 6

## 2016-12-29 LAB — POCT UA - MICROSCOPIC ONLY

## 2016-12-29 MED ORDER — CEPHALEXIN 500 MG PO CAPS: 500.0000 mg | ORAL_CAPSULE | Freq: Four times a day (QID) | ORAL | 0 refills | Status: DC

## 2016-12-29 NOTE — Progress Notes (Signed)
   [facility] Phone: [phone]  Subjective:  Anita [name] is a 21 year old female presenting to clinic with dysuria for the last three days. She also endorses urinary urgency and frequency. She has mild occasional suprapubic pain. She has noticed that her urine is cloudy. She has been trying to drink cranberry juice at home, which hasn't helped. No fevers, no chills, no flank pain, no nausea, no vomiting. No vaginal discharge, no vaginal lesions, no itchiness.   ROS: See HPI for pertinent positives and negatives  Past Medical History- migraines  Family history reviewed for today's visit. No changes.  Social history- patient is a never smoker  Objective: BP 118/70 (BP Location: Right Arm, Patient Position: Sitting, Cuff Size: Normal)   Pulse 81   Temp 97.9 F (36.6 C) (Oral)   Ht 5\' 8"  (1.727 m)   Wt 188 lb 6.4 oz (85.5 kg)   LMP 12/27/2016 (Exact Date)   SpO2 98%   BMI 28.65 kg/m  Gen: NAD, alert, cooperative with exam GI: +BS, soft, no suprapubic tenderness Back: No CVA tenderness  Assessment/Plan: Urinary Tract Infection:  Pt with dysuria, urgency, and frequency. No signs of pyelonephritis. - UA with small leukocytes, moderate RBCs, 10-20 WBCs. Pt currently menstruating.  - Treat with Keflex 500mg  qid x 7 days - Return precautions discussed   Hyman Bible, MD PGY-2

## 2016-12-29 NOTE — Patient Instructions (Signed)
It was so nice to see you!  You have a urinary tract infection. I have sent in an antibiotic called Keflex into your pharmacy. Please 1 tablet four times a day for 7 days.  -Dr. Brett Albino

## 2016-12-29 NOTE — Assessment & Plan Note (Signed)
Pt with dysuria, urgency, and frequency. No signs of pyelonephritis. - UA with small leukocytes, moderate RBCs, 10-20 WBCs. Pt currently menstruating.  - Treat with Keflex 500mg  qid x 7 days - Return precautions discussed

## 2017-01-09 ENCOUNTER — Ambulatory Visit (INDEPENDENT_AMBULATORY_CARE_PROVIDER_SITE_OTHER): Payer: BLUE CROSS/BLUE SHIELD | Admitting: Family Medicine

## 2017-01-09 ENCOUNTER — Other Ambulatory Visit (HOSPITAL_COMMUNITY)
Admission: RE | Admit: 2017-01-09 | Discharge: 2017-01-09 | Disposition: A | Discharge status: Home / Self Care | Payer: BLUE CROSS/BLUE SHIELD | Source: Ambulatory Visit | Attending: Family Medicine | Admitting: Family Medicine

## 2017-01-09 VITALS — BP 108/62 | HR 74 | Temp 97.9°F | Wt 187.2 lb

## 2017-01-09 DIAGNOSIS — R300 Dysuria: Secondary | ICD-10-CM

## 2017-01-09 DIAGNOSIS — Z7251 High risk heterosexual behavior: Secondary | ICD-10-CM | POA: Diagnosis not present

## 2017-01-09 DIAGNOSIS — Z113 Encounter for screening for infections with a predominantly sexual mode of transmission: Secondary | ICD-10-CM | POA: Insufficient documentation

## 2017-01-09 DIAGNOSIS — N898 Other specified noninflammatory disorders of vagina: Secondary | ICD-10-CM | POA: Insufficient documentation

## 2017-01-09 DIAGNOSIS — L298 Other pruritus: Secondary | ICD-10-CM | POA: Diagnosis not present

## 2017-01-09 LAB — POCT URINALYSIS DIPSTICK
BILIRUBIN UA: NEGATIVE
GLUCOSE UA: NEGATIVE
Ketones, UA: NEGATIVE
NITRITE UA: NEGATIVE
Protein, UA: NEGATIVE
Spec Grav, UA: 1.025
UROBILINOGEN UA: 0.2
pH, UA: 6

## 2017-01-09 LAB — POCT WET PREP (WET MOUNT)
CLUE CELLS WET PREP WHIFF POC: NEGATIVE
Trichomonas Wet Prep HPF POC: ABSENT

## 2017-01-09 LAB — HIV ANTIBODY (ROUTINE TESTING W REFLEX): HIV 1&2 Ab, 4th Generation: NONREACTIVE

## 2017-01-09 LAB — POCT URINE PREGNANCY: Preg Test, Ur: NEGATIVE

## 2017-01-09 MED ORDER — FLUCONAZOLE 150 MG PO TABS: 150.0000 mg | ORAL_TABLET | Freq: Every day | ORAL | 0 refills | Status: DC

## 2017-01-09 NOTE — Progress Notes (Signed)
   CC: vaginal itching  HPI Started itching and burning halfway through the keflex (rx'd 2/16), had a refill of diflucan left so she got this and took it on 2/23. Since she was seen last, she had unprotected sex and would like STI testing. No fevers or systemic symptoms today. No dysuria today. Feels like the UTI symptoms resolved, but the itching and burning in her vagina have persisted after diflucan.  Itching and burning most of the time, not associated with urination. No discharge. Has nexplanon in place. LMP was 2/16 but somewhat irregular.   CC, SH/smoking status, and VS noted  Objective: BP 108/62   Pulse 74   Temp 97.9 F (36.6 C) (Oral)   Wt 187 lb 3.2 oz (84.9 kg)   LMP 12/27/2016 (Exact Date)   SpO2 99%   BMI 28.46 kg/m  Gen: NAD, alert, cooperative, and pleasant. HEENT: NCAT, EOMI, PERRL GU: erythematous labia minora, scant thin white discharge on pelvic exam, no CMT, no lesions.  Ext: No edema, warm Neuro: Alert and oriented, Speech clear, No gross deficits  Assessment and plan:  Vaginal itching Recent keflex course for UTI, developed vaginal itching and burning halfway through this. Tried diflucan, which she had as an old refill. Itching and burning persisted. Today on wet prep with some yeast buds and bacteria. UPT neg. Urine with some blood and leuks, no nitrites. Will give additional dose of diflucan. Patient also requested STI testing (sent HIV, RPR, G/C). Will call patient with lab results.    Orders Placed This Encounter  Procedures  . Urine culture  . HIV antibody  . RPR  . POCT urinalysis dipstick  . POCT urine pregnancy  . POCT Wet Prep [facility])    Meds ordered this encounter  Medications  . fluconazole (DIFLUCAN) 150 MG tablet    Sig: Take 1 tablet (150 mg total) by mouth daily.    Dispense:  1 tablet    Refill:  0     Ralene Ok, MD, PGY1 01/09/2017 9:51 AM

## 2017-01-09 NOTE — Patient Instructions (Signed)
It was a pleasure to see you today! Thank you for choosing [facility] for your primary care. Anita [name] was seen for vaginal itching. You have some yeast that is likely causing the itching. We will try one more yeast medicine by mouth. I will call with the other results. Come back to the clinic if your symptoms persist after 3-5 days.  Best,  Dr. Lindell Noe

## 2017-01-09 NOTE — Assessment & Plan Note (Addendum)
Recent keflex course for UTI, developed vaginal itching and burning halfway through this. Tried diflucan, which she had as an old refill. Itching and burning persisted. Today on wet prep with some yeast buds and bacteria. UPT neg. Urine with some blood and leuks, no nitrites. Will give additional dose of diflucan. Patient also requested STI testing (sent HIV, RPR, G/C). Will call patient with lab results.

## 2017-01-10 ENCOUNTER — Telehealth: Payer: Self-pay | Admitting: Family Medicine

## 2017-01-10 LAB — GC/CHLAMYDIA PROBE AMP (~~LOC~~) NOT AT ARMC
CHLAMYDIA, DNA PROBE: NEGATIVE
NEISSERIA GONORRHEA: NEGATIVE

## 2017-01-10 LAB — RPR

## 2017-01-10 NOTE — Telephone Encounter (Signed)
I noticed patient has a same day appointment with me on March 6th. Same day appointment should be made 24-48 hrs ahead but this was made more than 7 days in advance. I contacted patient to switch her appointment to this Friday since I have opening and she wanted to be seen for UTI.  Patient told me she came in yesterday to see Dr. Lindell Noe as same day appointment. I went ahead and cancel her appointment for March 6th. She was okay with cancellation. She will follow up as needed.

## 2017-01-10 NOTE — Telephone Encounter (Signed)
Pt would like someone to call her about lab results. ep

## 2017-01-10 NOTE — Telephone Encounter (Signed)
Called patient to tell her that G/c and HIV, RPR all negative. She voiced understanding.

## 2017-01-16 ENCOUNTER — Ambulatory Visit: Payer: BLUE CROSS/BLUE SHIELD | Admitting: Family Medicine

## 2017-02-08 ENCOUNTER — Ambulatory Visit: Payer: Medicaid Other | Admitting: Internal Medicine

## 2017-02-09 DIAGNOSIS — L255 Unspecified contact dermatitis due to plants, except food: Secondary | ICD-10-CM | POA: Diagnosis not present

## 2017-02-13 ENCOUNTER — Encounter: Payer: Self-pay | Admitting: Family Medicine

## 2017-02-13 ENCOUNTER — Ambulatory Visit (INDEPENDENT_AMBULATORY_CARE_PROVIDER_SITE_OTHER): Payer: BLUE CROSS/BLUE SHIELD | Admitting: Family Medicine

## 2017-02-13 DIAGNOSIS — L247 Irritant contact dermatitis due to plants, except food: Secondary | ICD-10-CM

## 2017-02-13 DIAGNOSIS — L259 Unspecified contact dermatitis, unspecified cause: Secondary | ICD-10-CM | POA: Insufficient documentation

## 2017-02-13 MED ORDER — CLOBETASOL PROPIONATE 0.05 % EX OINT: 1.0000 "application " | TOPICAL_OINTMENT | Freq: Two times a day (BID) | CUTANEOUS | 0 refills | Status: DC

## 2017-02-13 MED ORDER — PREDNISONE 20 MG PO TABS: ORAL_TABLET | ORAL | 0 refills | Status: DC

## 2017-02-13 MED ORDER — HYDROXYZINE HCL 10 MG PO TABS: 10.0000 mg | ORAL_TABLET | Freq: Three times a day (TID) | ORAL | 0 refills | Status: DC | PRN

## 2017-02-13 NOTE — Patient Instructions (Signed)
I have prescribed a steroid taper over 21 days. Take prednisone 60mg  (3 tabs) daily x 7 days, then 40mg  (2 tabs) daily x 7 days, then 20mg  (1 tab) daily x 7 days I prescribed hydroxyzine to help with the itch. Take it at night initially, as it can make some people very drowsy. I prescribed a topical steroid as well. Do not put this on your face.  Do not use the topical steroid for more than 7 days. If your rash is not improving by Thursday or Friday, follow-up in our clinic.   Poison Ivy Dermatitis Poison ivy dermatitis is inflammation of the skin that is caused by the allergens on the leaves of the poison ivy plant. The skin reaction often involves redness, swelling, blisters, and extreme itching. What are the causes? This condition is caused by a specific chemical (urushiol) found in the sap of the poison ivy plant. This chemical is sticky and can be easily spread to people, animals, and objects. You can get poison ivy dermatitis by:  Having direct contact with a poison ivy plant.  Touching animals, other people, or objects that have come in contact with poison ivy and have the chemical on them. What increases the risk? This condition is more likely to develop in:  People who are outdoors often.  People who go outdoors without wearing protective clothing, such as closed shoes, long pants, and a long-sleeved shirt. What are the signs or symptoms? Symptoms of this condition include:  Redness and itching.  A rash that often includes bumps and blisters. The rash usually appears 48 hours after exposure.  Swelling. This may occur if the reaction is more severe. Symptoms usually last for 1-2 weeks. However, the first time you develop this condition, symptoms may last 3-4 weeks. How is this diagnosed? This condition may be diagnosed based on your symptoms and a physical exam. Your health care provider may also ask you about any recent outdoor activity. How is this treated? Treatment for  this condition will vary depending on how severe it is. Treatment may include:  Hydrocortisone creams or calamine lotions to relieve itching.  Oatmeal baths to soothe the skin.  Over-the-counter antihistamine tablets.  Oral steroid medicine for more severe outbreaks. Follow these instructions at home:  Take or apply over-the-counter and prescription medicines only as told by your health care provider.  Wash exposed skin as soon as possible with soap and cold water.  Use hydrocortisone creams or calamine lotion as needed to soothe the skin and relieve itching.  Take oatmeal baths as needed. Use colloidal oatmeal. You can get this at your local pharmacy or grocery store. Follow the instructions on the packaging.  Do not scratch or rub your skin.  While you have the rash, wash clothes right after you wear them. How is this prevented?  Learn to identify the poison ivy plant and avoid contact with the plant. This plant can be recognized by the number of leaves. Generally, poison ivy has three leaves with flowering branches on a single stem. The leaves are typically glossy, and they have jagged edges that come to a point at the front.  If you have been exposed to poison ivy, thoroughly wash with soap and water right away. You have about 30 minutes to remove the plant resin before it will cause the rash. Be sure to wash under your fingernails because any plant resin there will continue to spread the rash.  When hiking or camping, wear clothes that will help  you to avoid exposure on the skin. This includes long pants, a long-sleeved shirt, tall socks, and hiking boots. You can also apply preventive lotion to your skin to help limit exposure.  If you suspect that your clothes or outdoor gear came in contact with poison ivy, rinse them off outside with a garden hose before you bring them inside your house. Contact a health care provider if:  You have open sores in the rash area.  You have  more redness, swelling, or pain in the affected area.  You have redness that spreads beyond the rash area.  You have fluid, blood, or pus coming from the affected area.  You have a fever.  You have a rash over a large area of your body.  You have a rash on your eyes, mouth, or genitals.  Your rash does not improve after a few days. Get help right away if:  Your face swells or your eyes swell shut.  You have trouble breathing.  You have trouble swallowing. This information is not intended to replace advice given to you by your health care provider. Make sure you discuss any questions you have with your health care provider. Document Released: 10/27/2000 Document Revised: 04/06/2016 Document Reviewed: 04/07/2015 Elsevier Interactive Patient Education  2017 [employer].

## 2017-02-13 NOTE — Progress Notes (Signed)
Subjective: CC: poison ivy  HPI: Patient is a 21 y.o. female  presenting to clinic today for concerns for poison ivy or poison oak.  On Wednesday she woke up with red, pruritic papules on the back of her legs.  Some were in a linear pattern. Throughout the day it progressed to her back, buttocks, and face.  She notes the areas began to coalesce.   Her boyfriend is a Development worker, international aid and had the rash on Tuesday (she didn't see him that day).  She went to [facility] on Friday. They gave her oral prednisone (she states she was on what sounds like a 6 day taper from 60mg , today was her last dose).  They also prescribed a steroid cream, she thinks triamcinolone cream The rash was improving on her face initially with this treatment, but then came back. Everywhere else has been progressively getting worse.   She's intermittently been taking Benadryl. It doesn't make her drowsy. She's had a hard time sleeping. No fevers, chills, joint pain, joint swelling, respiratory symptoms, headaches.   Social History: non-smoker   ROS: All other systems reviewed and are negative.  Past Medical History Patient Active Problem List   Diagnosis Date Noted  . Contact dermatitis 02/13/2017  . Vaginal itching 01/09/2017  . Urinary tract infection 12/29/2016  . Viral URI with cough 09/12/2016  . Amenorrhea 07/07/2016  . Back pain 02/01/2016  . Vaginal discharge 01/27/2015  . Contraception management 01/21/2014  . COMMON MIGRAINE 01/31/2007    Medications- reviewed and updated Current Outpatient Prescriptions  Medication Sig Dispense Refill  . benzonatate (TESSALON PERLES) 100 MG capsule Take 1 capsule (100 mg total) by mouth 3 (three) times daily as needed for cough. 20 capsule 0  . clobetasol ointment (TEMOVATE) 0.17 % Apply 1 application topically 2 (two) times daily. 30 g 0  . etonogestrel (NEXPLANON) 68 MG IMPL implant 1 each by Subdermal route once.    . fluconazole (DIFLUCAN) 150 MG tablet Take 1  tablet (150 mg total) by mouth daily. 1 tablet 0  . hydrOXYzine (ATARAX/VISTARIL) 10 MG tablet Take 1 tablet (10 mg total) by mouth 3 (three) times daily as needed for itching. 20 tablet 0  . ipratropium (ATROVENT) 0.06 % nasal spray Place 2 sprays into both nostrils 4 (four) times daily. 15 mL 0  . naproxen (NAPROSYN) 500 MG tablet Take 1 tablet (500 mg total) by mouth 2 (two) times daily with a meal. (Patient not taking: Reported on 11/24/2016) 30 tablet 0  . predniSONE (DELTASONE) 20 MG tablet Take 60mg  (3 tabs) daily x 7 days, then 40mg  (2 tabs) daily x 7 days, then 20mg  (1 tab) daily x 7 days 42 tablet 0   No current facility-administered medications for this visit.     Objective: Office vital signs reviewed. BP 100/65 (BP Location: Right Arm, Patient Position: Sitting, Cuff Size: Normal)   Pulse 66   Temp 98 F (36.7 C) (Oral)   Ht 5\' 9"  (1.753 m)   Wt 196 lb 6.4 oz (89.1 kg)   LMP 11/26/2016 (Exact Date)   SpO2 99%   BMI 29.00 kg/m    Physical Examination:  General: Awake, alert, well- nourished, NAD Eyes: Conjunctiva non-injected.  Cardio: RRR, no m/r/g noted.  Pulm: No increased WOB.  CTAB, without wheezes, rhonchi or crackles noted.  Skin: linear area of erythematous papules on left jaw line. Significant erythematous, coalesced rash over back, abdomen, and back of legs with overlying excoriations. Some areas of linear pattern  still present. No warmth or drainage noted.   Assessment/Plan: Contact dermatitis Exam consistent with contact dermatitis. Discussed that oftentimes, pts require a much longer course of oral steroids than 6 days or they can have rebound. No evidence of superimposed infection. - Rx for prednisone 60mg  x 7d, 40mg  x 7d, then 20mg  x 7d - Rx for clobetasol, advised to avoid face and use no more than 7 days. - Rx for hydroxyzine. Discussed it could make her drowsy. - return precautions discussed, pt to f/u at the end of the week if no improvement or  worsening of rash.   No orders of the defined types were placed in this encounter.   Meds ordered this encounter  Medications  . clobetasol ointment (TEMOVATE) 0.05 %    Sig: Apply 1 application topically 2 (two) times daily.    Dispense:  30 g    Refill:  0  . predniSONE (DELTASONE) 20 MG tablet    Sig: Take 60mg  (3 tabs) daily x 7 days, then 40mg  (2 tabs) daily x 7 days, then 20mg  (1 tab) daily x 7 days    Dispense:  42 tablet    Refill:  0  . hydrOXYzine (ATARAX/VISTARIL) 10 MG tablet    Sig: Take 1 tablet (10 mg total) by mouth 3 (three) times daily as needed for itching.    Dispense:  20 tablet    Refill:  [place] PGY-3, [place]

## 2017-02-13 NOTE — Assessment & Plan Note (Signed)
Exam consistent with contact dermatitis. Discussed that oftentimes, pts require a much longer course of oral steroids than 6 days or they can have rebound. No evidence of superimposed infection. - Rx for prednisone 60mg  x 7d, 40mg  x 7d, then 20mg  x 7d - Rx for clobetasol, advised to avoid face and use no more than 7 days. - Rx for hydroxyzine. Discussed it could make her drowsy. - return precautions discussed, pt to f/u at the end of the week if no improvement or worsening of rash.

## 2017-02-14 ENCOUNTER — Telehealth: Payer: Self-pay | Admitting: Family Medicine

## 2017-02-14 MED ORDER — BETAMETHASONE DIPROPIONATE 0.05 % EX CREA: TOPICAL_CREAM | Freq: Two times a day (BID) | CUTANEOUS | 0 refills | Status: DC

## 2017-02-14 NOTE — Telephone Encounter (Signed)
Please let the pt know I sent in a Rx for another topical steroid cream; have her call the pharmacy to ensure it is not too expensive as well (it states it's on formulary when I prescribe it).  Thanks, Archie Patten, MD [facility] Resident  02/14/2017, 1:30 PM

## 2017-02-14 NOTE — Telephone Encounter (Signed)
Pt called because the cream Clobetasol that was called in is over 80.00. She can not afford that and would like something else called in. jw

## 2017-02-14 NOTE — Telephone Encounter (Signed)
Patient informed. 

## 2017-02-14 NOTE — Telephone Encounter (Signed)
Will forward to prescribing MD.

## 2017-02-16 ENCOUNTER — Ambulatory Visit: Payer: BLUE CROSS/BLUE SHIELD | Admitting: Family Medicine

## 2017-02-16 ENCOUNTER — Telehealth: Payer: Self-pay | Admitting: Family Medicine

## 2017-02-16 NOTE — Telephone Encounter (Signed)
Pt is calling to cancel her appointment, and wanted to speak to the doctor about her poison ivy and how it should now. jw

## 2017-02-19 NOTE — Telephone Encounter (Signed)
Please call her and ask what specific questions she has  Thanks  [place]

## 2017-02-20 NOTE — Telephone Encounter (Signed)
Pt said she had a question about the color of it. She stated that she talked to dr Lorenso Courier and now it is getting better. Deseree Kennon Holter, CMA

## 2017-02-20 NOTE — Telephone Encounter (Signed)
LMOVM for pt to call us back. Deseree Blount, CMA  

## 2017-03-05 ENCOUNTER — Telehealth: Payer: Self-pay | Admitting: Family Medicine

## 2017-03-05 MED ORDER — BETAMETHASONE DIPROPIONATE 0.05 % EX CREA: TOPICAL_CREAM | Freq: Two times a day (BID) | CUTANEOUS | 0 refills | Status: DC

## 2017-03-05 NOTE — Telephone Encounter (Signed)
Returned patient's call. Her rash has significantly  improved on her abdomen and back.   Rash still present on legs with some improvement. None on her face now.  She finished the 3 week steroid taper and was curious if the rash should be completely gone now.   No new lesions. Ran out of topical steroid.  Given overall improvement, will write another Rx for topical steroid, no need for any further oral steroids.  Archie Patten, MD [facility] Family Medicine Resident  03/05/2017, 11:59 AM

## 2017-03-05 NOTE — Telephone Encounter (Signed)
Crystal  Could you answer her question since you saw her for the rash   Thanks very much  Truman Hayward

## 2017-03-05 NOTE — Telephone Encounter (Signed)
Pt is on her last dose of steriod but the poison oak isnt gone.  It has gotten better. Should it be all gone by now or does she need another round of steriods? Please advise

## 2017-06-08 ENCOUNTER — Ambulatory Visit: Payer: BLUE CROSS/BLUE SHIELD | Admitting: Internal Medicine

## 2017-07-23 ENCOUNTER — Ambulatory Visit: Payer: BLUE CROSS/BLUE SHIELD | Admitting: Family Medicine

## 2017-07-26 ENCOUNTER — Ambulatory Visit (INDEPENDENT_AMBULATORY_CARE_PROVIDER_SITE_OTHER): Payer: BLUE CROSS/BLUE SHIELD | Admitting: Internal Medicine

## 2017-07-26 ENCOUNTER — Ambulatory Visit: Payer: BLUE CROSS/BLUE SHIELD | Admitting: Internal Medicine

## 2017-07-26 ENCOUNTER — Encounter: Payer: Self-pay | Admitting: Internal Medicine

## 2017-07-26 VITALS — BP 110/60 | HR 68 | Temp 98.3°F | Ht 69.0 in | Wt 199.0 lb

## 2017-07-26 DIAGNOSIS — R21 Rash and other nonspecific skin eruption: Secondary | ICD-10-CM

## 2017-07-26 MED ORDER — MUPIROCIN 2 % EX OINT: 1.0000 "application " | TOPICAL_OINTMENT | Freq: Two times a day (BID) | CUTANEOUS | 0 refills | Status: DC

## 2017-07-26 MED ORDER — TRIAMCINOLONE ACETONIDE 0.5 % EX OINT: 1.0000 "application " | TOPICAL_OINTMENT | Freq: Two times a day (BID) | CUTANEOUS | 3 refills | Status: DC

## 2017-07-26 NOTE — Progress Notes (Signed)
   Subjective:    Anita [name] - 21 y.o. female MRN [id number]  Date of birth: 12/05/1995  HPI  Anita [name] is here for rash.  Rash: Present in her underarms for several years. Reports significant irritation and skin color changes with shaving her underarms. Very itchy. Shaves about once per week and becomes symptomatic after shaving. Denies drainage or boils in the armpits. Has been trying Hydrocortisone cream at home without significant relief. No new shaving products or deodorants. No fevers, nausea, or vomiting.   -  reports that she has never smoked. She has never used smokeless tobacco. - Review of Systems: Per HPI. - Past Medical History: Patient Active Problem List   Diagnosis Date Noted  . Contact dermatitis 02/13/2017  . Vaginal itching 01/09/2017  . Urinary tract infection 12/29/2016  . Viral URI with cough 09/12/2016  . Amenorrhea 07/07/2016  . Back pain 02/01/2016  . Vaginal discharge 01/27/2015  . Contraception management 01/21/2014  . COMMON MIGRAINE 01/31/2007   - Medications: reviewed and updated   Objective:   Physical Exam BP 110/60   Pulse 68   Temp 98.3 F (36.8 C) (Oral)   Ht 5\' 9"  (1.753 m)   Wt 199 lb (90.3 kg)   LMP 07/04/2017   SpO2 98%   BMI 29.39 kg/m  Gen: NAD, alert, cooperative with exam, well-appearing Skin: erythema and hyperpigmentation in armpits bilaterally, scattered erythematous pustules present around hair follicles in armpits bilaterally R> L   Assessment & Plan:   1. Rash and nonspecific skin eruption Appears to be some type of contact dermatitis/irritation from shaving. Chronic in nature. Interestingly, patient does not have same symptoms in shaving of different regions of body. Appears to have a degree of bacterial folliculitis as well. Will treat with topical corticosteroid and antibiotic ointment. Discussed not shaving with dull razor and perhaps trying a sensitive skin shaving cream. May need to consider other means of  hair removal in the future such as laser hair removal. Follow up with PCP.  - triamcinolone ointment (KENALOG) 0.5 %; Apply 1 application topically 2 (two) times daily.  Dispense: 60 g; Refill: 3 - mupirocin ointment (BACTROBAN) 2 %; Apply 1 application topically 2 (two) times daily.  Dispense: 22 g; Refill: 0   Phill Myron, D.O. 07/30/2017, 10:20 AM PGY-3, [place]

## 2017-07-26 NOTE — Patient Instructions (Signed)
I have prescribed a steroid cream and antibiotic ointment for your underarms. Avoid shaving as this heals up. If you have significant itching you could take an over the counter anti-histamine such as Benadryl. I would recommend looking into long term hair removal techniques such as laser hair removal.

## 2017-07-27 ENCOUNTER — Ambulatory Visit: Payer: BLUE CROSS/BLUE SHIELD | Admitting: Internal Medicine

## 2017-08-07 ENCOUNTER — Ambulatory Visit (INDEPENDENT_AMBULATORY_CARE_PROVIDER_SITE_OTHER): Payer: BLUE CROSS/BLUE SHIELD | Admitting: Internal Medicine

## 2017-08-07 ENCOUNTER — Encounter: Payer: Self-pay | Admitting: Internal Medicine

## 2017-08-07 VITALS — BP 118/60 | HR 58 | Temp 98.3°F | Wt 198.0 lb

## 2017-08-07 DIAGNOSIS — R300 Dysuria: Secondary | ICD-10-CM

## 2017-08-07 LAB — POCT URINALYSIS DIP (MANUAL ENTRY)
Bilirubin, UA: NEGATIVE
Glucose, UA: NEGATIVE mg/dL
Ketones, POC UA: NEGATIVE mg/dL
Nitrite, UA: NEGATIVE
Protein Ur, POC: NEGATIVE mg/dL
Spec Grav, UA: 1.02
Urobilinogen, UA: 0.2 U/dL
pH, UA: 7

## 2017-08-07 LAB — POCT UA - MICROSCOPIC ONLY

## 2017-08-07 MED ORDER — CEPHALEXIN 500 MG PO CAPS: 500.0000 mg | ORAL_CAPSULE | Freq: Two times a day (BID) | ORAL | 0 refills | Status: DC

## 2017-08-07 NOTE — Patient Instructions (Signed)
Ms. Haynes,  Please take keflex 500 mg twice daily for 7 days. Your symptoms do sound consistent with urinary tract infection.   For prevention, make sure you are well hydrated, urinate after intercourse, use mild soaps, and consider trying cranberry supplements.  Best, Dr. Ola Spurr

## 2017-08-09 NOTE — Progress Notes (Signed)
[facility] Progress Note  Subjective:  Anita [name] is a 21 y.o. female with history of migraine and chlamydia who presents for concern for UTI. Has been having increased urinary frequency and sensation of pressure after urinating. Denies increased vaginal discharge, itchiness, or dyspareunia. Uses dove soap. Had sex and did not urinate afterwards a few days ago, which has been a trigger for UTIs for her in the past. Is currently menstruating. Urine culture did not result for presumptive UTI treated in February, but patient says symptoms resolved with antibiotics.  ROS: Occasional constipation; no fever or back pain  No Known Allergies  Objective: Blood pressure 118/60, pulse (!) 58, temperature 98.3 F (36.8 C), temperature source Oral, weight 198 lb (89.8 kg), last menstrual period 08/07/2017. Body mass index is 29.24 kg/m. Constitutional: Well-appearing female in NAD Pulmonary/Chest: Effort normal Abdominal: Soft. +BS, NT, ND Musculoskeletal: No CVA tenderness  Psychiatric: Normal mood and affect.  Vitals reviewed  UA with moderate blood and small leukoctyes  Assessment/Plan: Dysuria - Symptoms consistent with UTI. Blood on UA, however, could be from menstruation. - Provided rx for keflex 500 mg BID x 7 days. - For prevention, discussed staying well hydrated, urinating after intercourse, using mild soaps, avoiding constipation, and consider trying cranberry supplements.  Follow-up prn.  Olene Floss, MD [place], PGY-3

## 2017-08-10 DIAGNOSIS — R300 Dysuria: Secondary | ICD-10-CM | POA: Insufficient documentation

## 2017-08-10 NOTE — Assessment & Plan Note (Addendum)
-   Symptoms consistent with UTI. Blood on UA, however, could be from menstruation. - Provided rx for keflex 500 mg BID x 7 days. - For prevention, discussed staying well hydrated, urinating after intercourse, using mild soaps, avoiding constipation, and consider trying cranberry supplements.

## 2017-08-11 ENCOUNTER — Encounter (HOSPITAL_COMMUNITY): Payer: Self-pay | Admitting: *Deleted

## 2017-08-11 ENCOUNTER — Ambulatory Visit (HOSPITAL_COMMUNITY)
Admission: EM | Admit: 2017-08-11 | Discharge: 2017-08-11 | Disposition: A | Discharge status: Home / Self Care | Payer: BLUE CROSS/BLUE SHIELD | Attending: Emergency Medicine | Admitting: Emergency Medicine

## 2017-08-11 DIAGNOSIS — G43909 Migraine, unspecified, not intractable, without status migrainosus: Secondary | ICD-10-CM | POA: Insufficient documentation

## 2017-08-11 DIAGNOSIS — R3129 Other microscopic hematuria: Secondary | ICD-10-CM | POA: Diagnosis not present

## 2017-08-11 DIAGNOSIS — N029 Recurrent and persistent hematuria with unspecified morphologic changes: Secondary | ICD-10-CM | POA: Diagnosis not present

## 2017-08-11 DIAGNOSIS — R300 Dysuria: Secondary | ICD-10-CM | POA: Insufficient documentation

## 2017-08-11 LAB — POCT URINALYSIS DIP (DEVICE)
Bilirubin Urine: NEGATIVE
Glucose, UA: NEGATIVE mg/dL
KETONES UR: NEGATIVE mg/dL
NITRITE: NEGATIVE
PROTEIN: NEGATIVE mg/dL
SPECIFIC GRAVITY, URINE: 1.01 (ref 1.005–1.030)
UROBILINOGEN UA: 0.2 mg/dL (ref 0.0–1.0)
pH: 6 (ref 5.0–8.0)

## 2017-08-11 MED ORDER — FLUCONAZOLE 150 MG PO TABS: ORAL_TABLET | ORAL | 0 refills | Status: DC

## 2017-08-11 NOTE — ED Provider Notes (Signed)
[place]    CSN: [id number] Arrival date & time: 08/11/17  1356     History   Chief Complaint Chief Complaint  Patient presents with  . Vaginal Itching  . Dysuria    HPI Anita [name] is a 21 y.o. female.   21 year old female with a history of frequent "UTIs". Presents today with what she believes to be a yeast infection. She saw her PCP 3-4 days ago, had a urinalysis which showed microscopic hematuria as well as trace WBCs. Microscopic showed 0-3. She was empirically treated with Keflex. She states that a course of after starting the Keflex she had some burning and irritation in the genitals. She is also concerned about potential STD since she may not have an actual UTI. Denies vaginal discharge.      Past Medical History:  Diagnosis Date  . Chlamydia infection   . Migraines     Patient Active Problem List   Diagnosis Date Noted  . Dysuria 08/10/2017  . Contact dermatitis 02/13/2017  . Vaginal itching 01/09/2017  . Urinary tract infection 12/29/2016  . Viral URI with cough 09/12/2016  . Amenorrhea 07/07/2016  . Back pain 02/01/2016  . Vaginal discharge 01/27/2015  . Contraception management 01/21/2014  . COMMON MIGRAINE 01/31/2007    Past Surgical History:  Procedure Laterality Date  . APPENDECTOMY      OB History    Gravida Para Term Preterm AB Living   0 0 0 0 0 0   SAB TAB Ectopic Multiple Live Births   0 0 0 0 0       Home Medications    Prior to Admission medications   Medication Sig Start Date End Date Taking? Authorizing Provider  cephALEXin (KEFLEX) 500 MG capsule Take 1 capsule (500 mg total) by mouth 2 (two) times daily. 08/07/17  Yes Rogue Bussing, MD  etonogestrel (NEXPLANON) 68 MG IMPL implant 1 each by Subdermal route once.   Yes [provider]  benzonatate (TESSALON PERLES) 100 MG capsule Take 1 capsule (100 mg total) by mouth 3 (three) times daily as needed for cough. 12/11/16   Smiley Houseman,  MD  betamethasone dipropionate (DIPROLENE) 0.05 % cream Apply topically 2 (two) times daily. 03/05/17   Archie Patten, MD  clobetasol ointment (TEMOVATE) 2.50 % Apply 1 application topically 2 (two) times daily. 02/13/17   Archie Patten, MD  fluconazole (DIFLUCAN) 150 MG tablet 1 tab po x 1. May repeat in 72 hours if no improvement 08/11/17   Janne Napoleon, NP  hydrOXYzine (ATARAX/VISTARIL) 10 MG tablet Take 1 tablet (10 mg total) by mouth 3 (three) times daily as needed for itching. 02/13/17   Archie Patten, MD  ipratropium (ATROVENT) 0.06 % nasal spray Place 2 sprays into both nostrils 4 (four) times daily. 12/11/16   Smiley Houseman, MD  mupirocin ointment (BACTROBAN) 2 % Apply 1 application topically 2 (two) times daily. 07/26/17   Nicolette Bang, DO  naproxen (NAPROSYN) 500 MG tablet Take 1 tablet (500 mg total) by mouth 2 (two) times daily with a meal. Patient not taking: Reported on 11/24/2016 02/01/16   Alveda Reasons, MD  predniSONE (DELTASONE) 20 MG tablet Take 60mg  (3 tabs) daily x 7 days, then 40mg  (2 tabs) daily x 7 days, then 20mg  (1 tab) daily x 7 days 02/13/17   Archie Patten, MD  triamcinolone ointment (KENALOG) 0.5 % Apply 1 application topically 2 (two) times daily. 07/26/17   Juleen China,  Bayard Beaver, DO    Family History Family History  Problem Relation Age of Onset  . Migraines Maternal Grandfather     Social History Social History  Substance Use Topics  . Smoking status: Never Smoker  . Smokeless tobacco: Never Used  . Alcohol use Yes     Comment: occasionally     Allergies   Patient has no known allergies.   Review of Systems Review of Systems  Constitutional: Negative.   HENT: Negative.   Respiratory: Negative.   Gastrointestinal: Negative.   Genitourinary: Negative for pelvic pain.       As per history of present illness  All other systems reviewed and are negative.    Physical Exam Triage Vital Signs ED Triage Vitals  Enc  Vitals Group     BP 08/11/17 1502 125/63     Pulse Rate 08/11/17 1502 64     Resp 08/11/17 1502 16     Temp 08/11/17 1502 98.1 F (36.7 C)     Temp Source 08/11/17 1502 Oral     SpO2 08/11/17 1502 100 %     Weight --      Height --      Head Circumference --      Peak Flow --      Pain Score 08/11/17 1503 7     Pain Loc --      Pain Edu? --      Excl. in [place]? --    No data found.   Updated Vital Signs BP 125/63   Pulse 64   Temp 98.1 F (36.7 C) (Oral)   Resp 16   LMP 08/07/2017 (Exact Date)   SpO2 100%   Visual Acuity Right Eye Distance:   Left Eye Distance:   Bilateral Distance:    Right Eye Near:   Left Eye Near:    Bilateral Near:     Physical Exam  Constitutional: She is oriented to person, place, and time. She appears well-developed and well-nourished. No distress.  Eyes: EOM are normal.  Neck: Neck supple.  Cardiovascular: Normal rate.   Pulmonary/Chest: Effort normal. No respiratory distress.  Musculoskeletal: She exhibits no edema.  Neurological: She is alert and oriented to person, place, and time. She exhibits normal muscle tone.  Skin: Skin is warm and dry.  Psychiatric: She has a normal mood and affect.  Nursing note and vitals reviewed.    UC Treatments / Results  Labs (all labs ordered are listed, but only abnormal results are displayed) Labs Reviewed  POCT URINALYSIS DIP (DEVICE) - Abnormal; Notable for the following:       Result Value   Hgb urine dipstick MODERATE (*)    Leukocytes, UA TRACE (*)    All other components within normal limits  URINE CULTURE  URINE CYTOLOGY ANCILLARY ONLY    EKG  EKG Interpretation None       Radiology No results found.  Procedures Procedures (including critical care time)  Medications Ordered in UC Medications - No data to display   Initial Impression / Assessment and Plan / UC Course  I have reviewed the triage vital signs and the nursing notes.  Pertinent labs & imaging results that  were available during my care of the patient were reviewed by me and considered in my medical decision making (see chart for details). A culture of your urine will be performed. Your urine is also being tested for STDs and other infectious organisms. Follow-up with your primary care provider in  regards to the past several urines which contain blood. Continue taking your antibiotic. Start taking the Diflucan as directed. Drink plenty of fluids and stay well-hydrated. If your test comes back positive for any STD Erlinda Hong will be called and likely able to treat over the telephone.       Final Clinical Impressions(s) / UC Diagnoses   Final diagnoses:  Dysuria  Idiopathic hematuria, unspecified whether glomerular morphologic changes present    New Prescriptions New Prescriptions   FLUCONAZOLE (DIFLUCAN) 150 MG TABLET    1 tab po x 1. May repeat in 72 hours if no improvement     Controlled Substance Prescriptions [place] Controlled Substance Registry consulted? Not Applicable   Janne Napoleon, NP 08/11/17 1616

## 2017-08-11 NOTE — Discharge Instructions (Signed)
A culture of your urine will be performed. Your urine is also being tested for STDs and other infectious organisms. Follow-up with your primary care provider in regards to the past several urines which contain blood. Continue taking your antibiotic. Start taking the Diflucan as directed. Drink plenty of fluids and stay well-hydrated. If your test comes back positive for any STD Erlinda Hong will be called and likely able to treat over the telephone.

## 2017-08-11 NOTE — ED Triage Notes (Signed)
Was seen by PCP 4 days ago for UTI sxs; was told UA wasn't significant, but was given Keflex anyways.  Pt has been taking abx, started 1 day later with vaginal itching.  Denies vaginal discharge.  States concerned about STDs "if it's not a UTI or yeast infection".

## 2017-08-13 ENCOUNTER — Telehealth: Payer: Self-pay | Admitting: *Deleted

## 2017-08-13 ENCOUNTER — Encounter: Payer: Self-pay | Admitting: Women's Health

## 2017-08-13 ENCOUNTER — Ambulatory Visit (INDEPENDENT_AMBULATORY_CARE_PROVIDER_SITE_OTHER): Payer: BLUE CROSS/BLUE SHIELD | Admitting: Women's Health

## 2017-08-13 VITALS — BP 118/80

## 2017-08-13 DIAGNOSIS — A609 Anogenital herpesviral infection, unspecified: Secondary | ICD-10-CM | POA: Diagnosis not present

## 2017-08-13 DIAGNOSIS — N760 Acute vaginitis: Secondary | ICD-10-CM | POA: Diagnosis not present

## 2017-08-13 DIAGNOSIS — B9689 Other specified bacterial agents as the cause of diseases classified elsewhere: Secondary | ICD-10-CM

## 2017-08-13 DIAGNOSIS — N898 Other specified noninflammatory disorders of vagina: Secondary | ICD-10-CM | POA: Diagnosis not present

## 2017-08-13 LAB — WET PREP FOR TRICH, YEAST, CLUE

## 2017-08-13 LAB — URINE CYTOLOGY ANCILLARY ONLY
Chlamydia: NEGATIVE
Neisseria Gonorrhea: NEGATIVE
Trichomonas: NEGATIVE

## 2017-08-13 LAB — URINE CULTURE: CULTURE: NO GROWTH

## 2017-08-13 MED ORDER — VALACYCLOVIR HCL 500 MG PO TABS: ORAL_TABLET | ORAL | 6 refills | Status: DC

## 2017-08-13 MED ORDER — METRONIDAZOLE 500 MG PO TABS: 500.0000 mg | ORAL_TABLET | Freq: Two times a day (BID) | ORAL | 0 refills | Status: DC

## 2017-08-13 NOTE — Patient Instructions (Signed)

## 2017-08-13 NOTE — Telephone Encounter (Signed)
Patient called nurse line stating that she was recently treated with Keflex on 9/25 for a UTI but then started to experience vaginal irritation and burning. She states she went to Urgent Care for this on 9/29 and was given diflucan. Patient states she took the diflucan but has continued to have severe irritation as well as continued dysuria. Patient has two more dose left of the keflex and wants to know if she should continue or what else she should do. Will forward to treating MD and PCP.

## 2017-08-13 NOTE — Telephone Encounter (Signed)
Please contact her and let her know we would need to examine her and test a urine to decide what would be the best treatment.  Offer her a SDA visit   Thanks  [place]

## 2017-08-13 NOTE — Progress Notes (Signed)
Presents with complaint of vaginal irritation causing pain and discomfort for greater than one week. Today's third office appointment for problem. Was seen at primary care last week prescribed Diflucan, no relief . Was also treated for UTI with Keflex. Was seen at the urgent care 08/11/2017 had GC/Chlamydia culture, results not available. Same partner for 4 years, unfaithful in the past. Denies abdominal pain, fever. History of Chlamydia. 08/2014 Nexplanon rare spotting.  Exam: Tearful, external genitalia multiple small ulcerations near introitus, HSV culture taken/pending. Wet prep with a Q-tip, positive for clues, TNTC bacteria.  Bacteria vaginosis Probable HSV  Plan: Flagyl 500 twice daily for 7 days, alcohol precautions reviewed. Valtrex 500 twice daily for 10 days, prescription, proper use given and reviewed. Instructed to call if no relief of symptoms. Contraception options reviewed, has done well with Nexplanon  Schedule office appointment with Dr. Phineas Real to remove and replace Nexplanon this month.

## 2017-08-13 NOTE — Addendum Note (Signed)
Addended by: Burnett Kanaris on: 08/13/2017 04:33 PM   Modules accepted: Orders

## 2017-08-13 NOTE — Telephone Encounter (Signed)
Spoke with patient. She had STD testing that has not resulted yet from urgent care. She plans to follow-up with them and to have an exam by her Gynecologist.

## 2017-08-13 NOTE — Telephone Encounter (Signed)
Pt called c/o vaginal burning and itching, was treated for UTI, 1 day left on antibiotic, asked if STD, I told pt due to all complains OV would be needed. Transferred to front desk to schedule.

## 2017-08-15 LAB — SURESWAB HSV, TYPE 1/2 DNA, PCR
HSV 1 DNA: NOT DETECTED
HSV 2 DNA: NOT DETECTED

## 2017-08-15 LAB — URINE CYTOLOGY ANCILLARY ONLY
BACTERIAL VAGINITIS: NEGATIVE
CANDIDA VAGINITIS: NEGATIVE

## 2017-08-20 ENCOUNTER — Other Ambulatory Visit: Payer: Self-pay | Admitting: Internal Medicine

## 2017-08-20 DIAGNOSIS — R21 Rash and other nonspecific skin eruption: Secondary | ICD-10-CM

## 2017-09-07 ENCOUNTER — Ambulatory Visit: Payer: BLUE CROSS/BLUE SHIELD | Admitting: Internal Medicine

## 2017-09-14 ENCOUNTER — Encounter: Payer: Self-pay | Admitting: Gynecology

## 2017-09-14 ENCOUNTER — Ambulatory Visit (INDEPENDENT_AMBULATORY_CARE_PROVIDER_SITE_OTHER): Payer: BLUE CROSS/BLUE SHIELD | Admitting: Gynecology

## 2017-09-14 VITALS — BP 126/80

## 2017-09-14 DIAGNOSIS — Z30017 Encounter for initial prescription of implantable subdermal contraceptive: Secondary | ICD-10-CM | POA: Diagnosis not present

## 2017-09-14 DIAGNOSIS — Z3046 Encounter for surveillance of implantable subdermal contraceptive: Secondary | ICD-10-CM

## 2017-09-14 NOTE — Patient Instructions (Signed)
Nexplanon Instructions After Insertion  Keep bandage clean and dry for 24 hours  May use ice/Tylenol/Ibuprofen for soreness or pain  If you develop fever, drainage or increased warmth from incision site-contact office immediately   

## 2017-09-14 NOTE — Progress Notes (Signed)
    Anita [name] 1996-05-24 [id number]        21 y.o.  G0P0000 presents for Nexplanon removal and reinsertion.  She has had her current Nexplanon for 3 years.  Doing well with fairly regular menses and no significant intermenstrual bleeding.  I reviewed the Nexplanon removable and insertional process and the side effects/risks. I reviewed possible new irregular bleeding, insertion site infections, underlying neurovascular damage with permanent sequela, migration of the implant making removal difficult requiring surgery, the need to have it removed in 3 years under a separate procedure and lastly the risk of failure with pregnancy.  The patient is right-handed. She has read through and signed the consent form.  Procedure with Wandra Scot assistant with:    The left upper, inner arm was examined with the Nexplanon rod clearly palpated.  The rod had migrated above the old insertional scar requiring a separate incision site.  The skin overlying the distal portion of the rod was cleansed with Betadine and infiltrated with 1% lidocaine. A small skin incision was made with a scalpel over the distal tip of the Nexplanon rod. The Nexplanon tip was subsequently delivered through the incision using blunt and sharp dissection and the tip was grasped with a forcep and the Nexplanon rod was removed intact, shown to the patient and discarded.  The insertional tract was then infiltrated with 1% lidocaine. The new Nexplanon was placed according to manufacturer's recommendation without difficulty. The skin defect was closed with a Steri-Strip. The patient palpated the rod. A pressure dressing was applied and postoperative instructions give her.   Lot number:  [id number]     Anastasio Auerbach MD, 1:01 PM 09/14/2017

## 2017-09-24 ENCOUNTER — Encounter: Payer: Self-pay | Admitting: Gynecology

## 2017-10-16 DIAGNOSIS — J069 Acute upper respiratory infection, unspecified: Secondary | ICD-10-CM | POA: Diagnosis not present

## 2017-10-29 ENCOUNTER — Encounter: Payer: Self-pay | Admitting: Women's Health

## 2017-10-29 ENCOUNTER — Ambulatory Visit: Payer: BLUE CROSS/BLUE SHIELD | Admitting: Women's Health

## 2017-10-29 VITALS — BP 122/80

## 2017-10-29 DIAGNOSIS — N898 Other specified noninflammatory disorders of vagina: Secondary | ICD-10-CM | POA: Diagnosis not present

## 2017-10-29 DIAGNOSIS — B9689 Other specified bacterial agents as the cause of diseases classified elsewhere: Secondary | ICD-10-CM | POA: Diagnosis not present

## 2017-10-29 DIAGNOSIS — B373 Candidiasis of vulva and vagina: Secondary | ICD-10-CM | POA: Diagnosis not present

## 2017-10-29 DIAGNOSIS — B3731 Acute candidiasis of vulva and vagina: Secondary | ICD-10-CM

## 2017-10-29 DIAGNOSIS — N760 Acute vaginitis: Secondary | ICD-10-CM

## 2017-10-29 DIAGNOSIS — N3000 Acute cystitis without hematuria: Secondary | ICD-10-CM | POA: Diagnosis not present

## 2017-10-29 DIAGNOSIS — R350 Frequency of micturition: Secondary | ICD-10-CM | POA: Diagnosis not present

## 2017-10-29 LAB — WET PREP FOR TRICH, YEAST, CLUE

## 2017-10-29 MED ORDER — METRONIDAZOLE 500 MG PO TABS: 500.0000 mg | ORAL_TABLET | Freq: Two times a day (BID) | ORAL | 0 refills | Status: DC

## 2017-10-29 MED ORDER — FLUCONAZOLE 150 MG PO TABS: 150.0000 mg | ORAL_TABLET | Freq: Once | ORAL | 1 refills | Status: AC

## 2017-10-29 MED ORDER — SULFAMETHOXAZOLE-TRIMETHOPRIM 800-160 MG PO TABS: 1.0000 | ORAL_TABLET | Freq: Two times a day (BID) | ORAL | 0 refills | Status: DC

## 2017-10-29 NOTE — Patient Instructions (Addendum)
Vaginal Yeast infection, Adult Vaginal yeast infection is a condition that causes soreness, swelling, and redness (inflammation) of the vagina. It also causes vaginal discharge. This is a common condition. Some women get this infection frequently. What are the causes? This condition is caused by a change in the normal balance of the yeast (candida) and bacteria that live in the vagina. This change causes an overgrowth of yeast, which causes the inflammation. What increases the risk? This condition is more likely to develop in:  Women who take antibiotic medicines.  Women who have diabetes.  Women who take birth control pills.  Women who are pregnant.  Women who douche often.  Women who have a weak defense (immune) system.  Women who have been taking steroid medicines for a long time.  Women who frequently wear tight clothing.  What are the signs or symptoms? Symptoms of this condition include:  White, thick vaginal discharge.  Swelling, itching, redness, and irritation of the vagina. The lips of the vagina (vulva) may be affected as well.  Pain or a burning feeling while urinating.  Pain during sex.  How is this diagnosed? This condition is diagnosed with a medical history and physical exam. This will include a pelvic exam. Your health care provider will examine a sample of your vaginal discharge under a microscope. Your health care provider may send this sample for testing to confirm the diagnosis. How is this treated? This condition is treated with medicine. Medicines may be over-the-counter or prescription. You may be told to use one or more of the following:  Medicine that is taken orally.  Medicine that is applied as a cream.  Medicine that is inserted directly into the vagina (suppository).  Follow these instructions at home:  Take or apply over-the-counter and prescription medicines only as told by your health care provider.  Do not have sex until your health  care provider has approved. Tell your sex partner that you have a yeast infection. That person should go to his or her health care provider if he or she develops symptoms.  Do not wear tight clothes, such as pantyhose or tight pants.  Avoid using tampons until your health care provider approves.  Eat more yogurt. This may help to keep your yeast infection from returning.  Try taking a sitz bath to help with discomfort. This is a warm water bath that is taken while you are sitting down. The water should only come up to your hips and should cover your buttocks. Do this 3-4 times per day or as told by your health care provider.  Do not douche.  Wear breathable, cotton underwear.  If you have diabetes, keep your blood sugar levels under control. Contact a health care provider if:  You have a fever.  Your symptoms go away and then return.  Your symptoms do not get better with treatment.  Your symptoms get worse.  You have new symptoms.  You develop blisters in or around your vagina.  You have blood coming from your vagina and it is not your menstrual period.  You develop pain in your abdomen. This information is not intended to replace advice given to you by your health care provider. Make sure you discuss any questions you have with your health care provider. Document Released: 08/09/2005 Document Revised: 04/12/2016 Document Reviewed: 05/03/2015 Elsevier Interactive Patient Education  2018 [employer].  Urinary Tract Infection, Adult A urinary tract infection (UTI) is an infection of any part of the urinary tract. The  urinary tract includes the:  Kidneys.  Ureters.  Bladder.  Urethra.  These organs make, store, and get rid of pee (urine) in the body. Follow these instructions at home:  Take over-the-counter and prescription medicines only as told by your doctor.  If you were prescribed an antibiotic medicine, take it as told by your doctor. Do not stop taking the  antibiotic even if you start to feel better.  Avoid the following drinks: ? Alcohol. ? Caffeine. ? Tea. ? Carbonated drinks.  Drink enough fluid to keep your pee clear or pale yellow.  Keep all follow-up visits as told by your doctor. This is important.  Make sure to: ? Empty your bladder often and completely. Do not to hold pee for long periods of time. ? Empty your bladder before and after sex. ? Wipe from front to back after a bowel movement if you are female. Use each tissue one time when you wipe. Contact a doctor if:  You have back pain.  You have a fever.  You feel sick to your stomach (nauseous).  You throw up (vomit).  Your symptoms do not get better after 3 days.  Your symptoms go away and then come back. Get help right away if:  You have very bad back pain.  You have very bad lower belly (abdominal) pain.  You are throwing up and cannot keep down any medicines or water. This information is not intended to replace advice given to you by your health care provider. Make sure you discuss any questions you have with your health care provider. Document Released: 04/17/2008 Document Revised: 04/06/2016 Document Reviewed: 09/20/2015 Elsevier Interactive Patient Education  [employer].

## 2017-10-29 NOTE — Progress Notes (Signed)
21 year old S WF G0 presents with complaint of urinary frequency, pain and burning at end of stream of urination, vaginal discharge with itching. Declines STD screen. Rare bleeding with Nexplanon, condoms consistently. Denies abdominal pain, fever, GI problems.  Exam: Appears well. External genitalia within normal limits, speculum exam moderate amount of a curdy white discharge noted, no odor noted, wet prep positive for yeast, clues, TNTC bacteria. Bimanual no CMT or adnexal tenderness. UA:  +1 leukocytes, +2 blood, 6-10 WBCs, 10-20 rbc's, many bacteria,  Yeast vaginitis Bacteria vaginosis UTI  Plan: Flagyl 500 twice daily for 7 days, alcohol precautions reviewed. Diflucan 150 by mouth times one dose with refill. Septra twice daily for 3 days, prescriptions were all given, will wait for urine culture results before taking Septra. Instructed to call if no relief of symptoms. Continue condom use.

## 2017-10-31 LAB — URINE CULTURE
MICRO NUMBER:: 81420294
RESULT: NO GROWTH
SPECIMEN QUALITY: ADEQUATE

## 2017-10-31 LAB — URINALYSIS W MICROSCOPIC + REFLEX CULTURE
BILIRUBIN URINE: NEGATIVE
GLUCOSE, UA: NEGATIVE
Hyaline Cast: NONE SEEN /LPF
Ketones, ur: NEGATIVE
Nitrites, Initial: NEGATIVE
PROTEIN: NEGATIVE
Specific Gravity, Urine: 1.028 (ref 1.001–1.03)
pH: 5.5 (ref 5.0–8.0)

## 2017-10-31 LAB — CULTURE INDICATED

## 2017-11-19 ENCOUNTER — Telehealth: Payer: Self-pay | Admitting: *Deleted

## 2017-11-19 ENCOUNTER — Other Ambulatory Visit: Payer: Self-pay | Admitting: Gynecology

## 2017-11-19 MED ORDER — MEDROXYPROGESTERONE ACETATE 10 MG PO TABS: 10.0000 mg | ORAL_TABLET | Freq: Two times a day (BID) | ORAL | 0 refills | Status: DC

## 2017-11-19 NOTE — Telephone Encounter (Signed)
Recommend Provera 10 mg twice daily times 5 days to see if this does not stop her bleeding

## 2017-11-19 NOTE — Telephone Encounter (Signed)
Patient had Nexplanon inserted on 09/14/17 has been doing well, had a cycle end of Nov, cycle at the end of December and still bleeding now 10 days, I did relay not abnormal to have breakthough bleeding, no heavy changing pad every 3-4 hours. Please advise

## 2017-11-19 NOTE — Telephone Encounter (Signed)
Pt aware, Rx sent. 

## 2017-11-23 ENCOUNTER — Encounter (HOSPITAL_COMMUNITY): Payer: Self-pay | Admitting: Family Medicine

## 2017-11-23 ENCOUNTER — Ambulatory Visit (HOSPITAL_COMMUNITY)
Admission: EM | Admit: 2017-11-23 | Discharge: 2017-11-23 | Disposition: A | Discharge status: Home / Self Care | Payer: BLUE CROSS/BLUE SHIELD | Attending: Internal Medicine | Admitting: Internal Medicine

## 2017-11-23 DIAGNOSIS — J020 Streptococcal pharyngitis: Secondary | ICD-10-CM

## 2017-11-23 LAB — POCT RAPID STREP A: Streptococcus, Group A Screen (Direct): POSITIVE — AB

## 2017-11-23 MED ORDER — AMOXICILLIN 500 MG PO CAPS: 500.0000 mg | ORAL_CAPSULE | Freq: Two times a day (BID) | ORAL | 0 refills | Status: AC

## 2017-11-23 NOTE — Discharge Instructions (Signed)
Considered contagious for next 24 hours. Tylenol and/or ibuprofen as needed for pain or fevers.  Push fluids to ensure adequate hydration and keep secretions thin.

## 2017-11-23 NOTE — ED Provider Notes (Signed)
[facility]    CSN: [id number] Arrival date & time: 11/23/17  1053     History   Chief Complaint Chief Complaint  Patient presents with  . Sore Throat    HPI Anita [name] is a 22 y.o. female.   Aniesa presents with complaints of sore throat and headache which started yesterday. Mild cough and runny nose. 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 Feels more pain to right throat. Pain with swallowing. Has been suing cough drops and numbing spray which has helped some. Has had strep in the past and states this feels similar. Without known fevers. Rates pain 7/10. Without rash.   ROS per HPI.       Past Medical History:  Diagnosis Date  . Chlamydia infection   . Migraines     Patient Active Problem List   Diagnosis Date Noted  . Dysuria 08/10/2017  . Contact dermatitis 02/13/2017  . Vaginal itching 01/09/2017  . Urinary tract infection 12/29/2016  . Viral URI with cough 09/12/2016  . Amenorrhea 07/07/2016  . Back pain 02/01/2016  . Vaginal discharge 01/27/2015  . Contraception management 01/21/2014  . COMMON MIGRAINE 01/31/2007    Past Surgical History:  Procedure Laterality Date  . APPENDECTOMY      OB History    Gravida Para Term Preterm AB Living   0 0 0 0 0 0   SAB TAB Ectopic Multiple Live Births   0 0 0 0 0       Home Medications    Prior to Admission medications   Medication Sig Start Date End Date Taking? Authorizing Provider  amoxicillin (AMOXIL) 500 MG capsule Take 1 capsule (500 mg total) by mouth 2 (two) times daily for 10 days. 11/23/17 12/03/17  Zigmund Gottron, NP  etonogestrel (NEXPLANON) 68 MG IMPL implant 1 each by Subdermal route once.    [provider]  medroxyPROGESTERone (PROVERA) 10 MG tablet Take 1 tablet (10 mg total) by mouth 2 (two) times daily. 11/19/17   Fontaine, Belinda Block, MD    Family History Family History  Problem Relation Age of Onset  . Migraines Maternal Grandfather      Social History Social History   Tobacco Use  . Smoking status: Never Smoker  . Smokeless tobacco: Never Used  Substance Use Topics  . Alcohol use: Yes    Comment: occasionally  . Drug use: No     Allergies   Patient has no known allergies.   Review of Systems Review of Systems   Physical Exam Triage Vital Signs ED Triage Vitals [11/23/17 1116]  Enc Vitals Group     BP 123/75     Pulse Rate 69     Resp 18     Temp 98 F (36.7 C)     Temp src      SpO2 100 %     Weight      Height      Head Circumference      Peak Flow      Pain Score 7     Pain Loc      Pain Edu?      Excl. in [place]?    No data found.  Updated Vital Signs BP 123/75   Pulse 69   Temp 98 F (36.7 C)   Resp 18   SpO2 100%   Visual Acuity Right Eye Distance:   Left Eye Distance:   Bilateral Distance:    Right Eye  Near:   Left Eye Near:    Bilateral Near:     Physical Exam  Constitutional: She is oriented to person, place, and time. She appears well-developed and well-nourished. No distress.  HENT:  Head: Normocephalic and atraumatic.  Right Ear: Tympanic membrane, external ear and ear canal normal.  Left Ear: Tympanic membrane, external ear and ear canal normal.  Nose: Nose normal.  Mouth/Throat: Uvula is midline, oropharynx is clear and moist and mucous membranes are normal. Tonsils are 2+ on the right. Tonsils are 2+ on the left. Tonsillar exudate.  Eyes: Conjunctivae and EOM are normal. Pupils are equal, round, and reactive to light.  Cardiovascular: Normal rate, regular rhythm and normal heart sounds.  Pulmonary/Chest: Effort normal and breath sounds normal.  Lymphadenopathy:    She has cervical adenopathy.  Neurological: She is alert and oriented to person, place, and time.  Skin: Skin is warm and dry.     UC Treatments / Results  Labs (all labs ordered are listed, but only abnormal results are displayed) Labs Reviewed  POCT RAPID STREP A - Abnormal; Notable for the  following components:      Result Value   Streptococcus, Group A Screen (Direct) POSITIVE (*)    All other components within normal limits    EKG  EKG Interpretation None       Radiology No results found.  Procedures Procedures (including critical care time)  Medications Ordered in UC Medications - No data to display   Initial Impression / Assessment and Plan / UC Course  I have reviewed the triage vital signs and the nursing notes.  Pertinent labs & imaging results that were available during my care of the patient were reviewed by me and considered in my medical decision making (see chart for details).     Complete course of antibiotics. Push fluids to ensure adequate hydration and keep secretions thin. Tylenol and/or ibuprofen as needed for pain or fevers.  Considered contagious for 24 hours after starting antibiotics. If symptoms worsen or do not improve in the next week to return to be seen or to follow up with PCP.  Patient verbalized understanding and agreeable to plan.    Final Clinical Impressions(s) / UC Diagnoses   Final diagnoses:  Strep pharyngitis    ED Discharge Orders        Ordered    amoxicillin (AMOXIL) 500 MG capsule  2 times daily     11/23/17 1143       Controlled Substance Prescriptions City [place] Controlled Substance Registry consulted? Not Applicable   Zigmund Gottron, NP 11/23/17 1146

## 2017-11-23 NOTE — ED Triage Notes (Signed)
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 Tonsils enlarged.

## 2017-11-26 ENCOUNTER — Encounter: Payer: BLUE CROSS/BLUE SHIELD | Admitting: Women's Health

## 2017-11-26 DIAGNOSIS — Z0289 Encounter for other administrative examinations: Secondary | ICD-10-CM

## 2017-11-27 ENCOUNTER — Other Ambulatory Visit: Payer: Self-pay

## 2017-11-28 ENCOUNTER — Telehealth: Payer: Self-pay | Admitting: *Deleted

## 2017-11-28 NOTE — Telephone Encounter (Signed)
Pt called and left message in voicemail stating bleeding has returned after finishing provera. I left message for pt to call.

## 2017-11-28 NOTE — Telephone Encounter (Signed)
Follow up from 11/19/17 call) Pt called back Dr.Fontaine prescribed provera 10 mg on 11/19/17 for 5 days bleeding did stop has restarted, states heavy this am, has not changed this am yet, typically changes every 3-4 hours. Had nexplanon inserted on 09/14/17. Pt asked if this normal? She had a total of 13 days of bleeding.

## 2017-11-28 NOTE — Telephone Encounter (Signed)
Telephone call, today flow is like rregular cycle no cramping, will watch at this time if it lasts greater than 7 days will call. Reviewed it is very common to have some irregular bleeding first few months with Nexplanon. Had no cycle of the month of November.

## 2017-12-17 ENCOUNTER — Encounter: Payer: Self-pay | Admitting: Women's Health

## 2017-12-17 ENCOUNTER — Ambulatory Visit (INDEPENDENT_AMBULATORY_CARE_PROVIDER_SITE_OTHER): Payer: BLUE CROSS/BLUE SHIELD | Admitting: Women's Health

## 2017-12-17 VITALS — BP 120/74

## 2017-12-17 DIAGNOSIS — B373 Candidiasis of vulva and vagina: Secondary | ICD-10-CM | POA: Diagnosis not present

## 2017-12-17 DIAGNOSIS — N760 Acute vaginitis: Secondary | ICD-10-CM | POA: Diagnosis not present

## 2017-12-17 DIAGNOSIS — B9689 Other specified bacterial agents as the cause of diseases classified elsewhere: Secondary | ICD-10-CM | POA: Diagnosis not present

## 2017-12-17 DIAGNOSIS — N898 Other specified noninflammatory disorders of vagina: Secondary | ICD-10-CM | POA: Diagnosis not present

## 2017-12-17 DIAGNOSIS — B3731 Acute candidiasis of vulva and vagina: Secondary | ICD-10-CM

## 2017-12-17 LAB — WET PREP FOR TRICH, YEAST, CLUE

## 2017-12-17 MED ORDER — FLUCONAZOLE 150 MG PO TABS: 150.0000 mg | ORAL_TABLET | Freq: Once | ORAL | 1 refills | Status: AC

## 2017-12-17 MED ORDER — METRONIDAZOLE 500 MG PO TABS: 500.0000 mg | ORAL_TABLET | Freq: Two times a day (BID) | ORAL | 0 refills | Status: DC

## 2017-12-17 NOTE — Progress Notes (Signed)
22 yo SBFG0 presents with complaint of white vaginal discharge and itching. Last week discharge was white with cottage cheese texture, took 1 Diflucan pill she had at home, discharge became thinner and itching decreased but both persisted. History of BV and yeast one month ago, Chlamydia 2017. New partners since last STD screen, condom use. 09/14/2017 - Nexplanon inserted, reports irregular periods with no complaint today regarding periods. Denies abdominal pain, urinary symptoms, or fever. Declines STD screening due to cost. College student guidance counseling goal.  Exam: Appears well. External genitalia within normal limits. On speculum exam, vaginal walls appear erythematous with white discharge. Wet prep positive for yeast,clue cells, TNTC bacteria.   Yeast vaginitis  Bacterial vaginosis STD screen - history of Chlamydia  Plan: Options reviewed, instructed to go to health Department for STD screen.  Flagyl 500 mg BID x 7 days, alcohol precautions reviewed,  Diflucan 150 mg once, and Boric Acid capsules twice a week start after symptoms resolve, reviewed for prevention. Counseled patient to continue condom use, STD prevention, and yeast infection prevention. Instructed to call if no relief of symptoms.

## 2017-12-17 NOTE — Patient Instructions (Signed)
Vaginal Yeast infection, Adult Vaginal yeast infection is a condition that causes soreness, swelling, and redness (inflammation) of the vagina. It also causes vaginal discharge. This is a common condition. Some women get this infection frequently. What are the causes? This condition is caused by a change in the normal balance of the yeast (candida) and bacteria that live in the vagina. This change causes an overgrowth of yeast, which causes the inflammation. What increases the risk? This condition is more likely to develop in:  Women who take antibiotic medicines.  Women who have diabetes.  Women who take birth control pills.  Women who are pregnant.  Women who douche often.  Women who have a weak defense (immune) system.  Women who have been taking steroid medicines for a long time.  Women who frequently wear tight clothing.  What are the signs or symptoms? Symptoms of this condition include:  White, thick vaginal discharge.  Swelling, itching, redness, and irritation of the vagina. The lips of the vagina (vulva) may be affected as well.  Pain or a burning feeling while urinating.  Pain during sex.  How is this diagnosed? This condition is diagnosed with a medical history and physical exam. This will include a pelvic exam. Your health care provider will examine a sample of your vaginal discharge under a microscope. Your health care provider may send this sample for testing to confirm the diagnosis. How is this treated? This condition is treated with medicine. Medicines may be over-the-counter or prescription. You may be told to use one or more of the following:  Medicine that is taken orally.  Medicine that is applied as a cream.  Medicine that is inserted directly into the vagina (suppository).  Follow these instructions at home:  Take or apply over-the-counter and prescription medicines only as told by your health care provider.  Do not have sex until your health  care provider has approved. Tell your sex partner that you have a yeast infection. That person should go to his or her health care provider if he or she develops symptoms.  Do not wear tight clothes, such as pantyhose or tight pants.  Avoid using tampons until your health care provider approves.  Eat more yogurt. This may help to keep your yeast infection from returning.  Try taking a sitz bath to help with discomfort. This is a warm water bath that is taken while you are sitting down. The water should only come up to your hips and should cover your buttocks. Do this 3-4 times per day or as told by your health care provider.  Do not douche.  Wear breathable, cotton underwear.  If you have diabetes, keep your blood sugar levels under control. Contact a health care provider if:  You have a fever.  Your symptoms go away and then return.  Your symptoms do not get better with treatment.  Your symptoms get worse.  You have new symptoms.  You develop blisters in or around your vagina.  You have blood coming from your vagina and it is not your menstrual period.  You develop pain in your abdomen. This information is not intended to replace advice given to you by your health care provider. Make sure you discuss any questions you have with your health care provider. Document Released: 08/09/2005 Document Revised: 04/12/2016 Document Reviewed: 05/03/2015 Elsevier Interactive Patient Education  2018 [employer] Bacterial Vaginosis Bacterial vaginosis is an infection of the vagina. It happens when too many germs (bacteria) grow in the vagina.   This infection puts you at risk for infections from sex (STIs). Treating this infection can lower your risk for some STIs. You should also treat this if you are pregnant. It can cause your baby to be born early. Follow these instructions at home: Medicines  Take over-the-counter and prescription medicines only as told by your doctor.  Take or use  your antibiotic medicine as told by your doctor. Do not stop taking or using it even if you start to feel better. General instructions  If you your sexual partner is a woman, tell her that you have this infection. She needs to get treatment if she has symptoms. If you have a female partner, he does not need to be treated.  During treatment: ? Avoid sex. ? Do not douche. ? Avoid alcohol as told. ? Avoid breastfeeding as told.  Drink enough fluid to keep your pee (urine) clear or pale yellow.  Keep your vagina and butt (rectum) clean. ? Wash the area with warm water every day. ? Wipe from front to back after you use the toilet.  Keep all follow-up visits as told by your doctor. This is important. Preventing this condition  Do not douche.  Use only warm water to wash around your vagina.  Use protection when you have sex. This includes: ? Latex condoms. ? Dental dams.  Limit how many people you have sex with. It is best to only have sex with the same person (be monogamous).  Get tested for STIs. Have your partner get tested.  Wear underwear that is cotton or lined with cotton.  Avoid tight pants and pantyhose. This is most important in summer.  Do not use any products that have nicotine or tobacco in them. These include cigarettes and e-cigarettes. If you need help quitting, ask your doctor.  Do not use illegal drugs.  Limit how much alcohol you drink. Contact a doctor if:  Your symptoms do not get better, even after you are treated.  You have more discharge or pain when you pee (urinate).  You have a fever.  You have pain in your belly (abdomen).  You have pain with sex.  Your bleed from your vagina between periods. Summary  This infection happens when too many germs (bacteria) grow in the vagina.  Treating this condition can lower your risk for some infections from sex (STIs).  You should also treat this if you are pregnant. It can cause early (premature)  birth.  Do not stop taking or using your antibiotic medicine even if you start to feel better. This information is not intended to replace advice given to you by your health care provider. Make sure you discuss any questions you have with your health care provider. Document Released: 08/08/2008 Document Revised: 07/15/2016 Document Reviewed: 07/15/2016 Elsevier Interactive Patient Education  2017 [employer]  

## 2017-12-18 ENCOUNTER — Telehealth: Payer: Self-pay | Admitting: *Deleted

## 2017-12-18 MED ORDER — NONFORMULARY OR COMPOUNDED ITEM: 0 refills | Status: DC

## 2017-12-18 NOTE — Telephone Encounter (Signed)
Rx called in 

## 2017-12-18 NOTE — Telephone Encounter (Signed)
-----   Message from Huel Cote, NP sent at 12/17/2017  4:10 PM EST ----- Please call in Boric acid gelcaps 600 mg per vagina twice weekly #[phone] to [facility]

## 2018-01-17 ENCOUNTER — Ambulatory Visit: Payer: BLUE CROSS/BLUE SHIELD | Admitting: Internal Medicine

## 2018-01-17 ENCOUNTER — Encounter: Payer: Self-pay | Admitting: Licensed Clinical Social Worker

## 2018-01-17 VITALS — BP 120/80 | HR 78 | Temp 97.9°F | Wt 201.2 lb

## 2018-01-17 DIAGNOSIS — F329 Major depressive disorder, single episode, unspecified: Secondary | ICD-10-CM

## 2018-01-17 DIAGNOSIS — J069 Acute upper respiratory infection, unspecified: Secondary | ICD-10-CM

## 2018-01-17 DIAGNOSIS — F32A Depression, unspecified: Secondary | ICD-10-CM

## 2018-01-17 DIAGNOSIS — B9789 Other viral agents as the cause of diseases classified elsewhere: Secondary | ICD-10-CM

## 2018-01-17 HISTORY — DX: Depression, unspecified: F32.A

## 2018-01-17 LAB — POCT RAPID STREP A (OFFICE): RAPID STREP A SCREEN: POSITIVE — AB

## 2018-01-17 MED ORDER — FLUTICASONE PROPIONATE 50 MCG/ACT NA SUSP: 2.0000 | Freq: Every day | NASAL | 6 refills | Status: DC

## 2018-01-17 MED ORDER — AMOXICILLIN 500 MG PO CAPS: 500.0000 mg | ORAL_CAPSULE | Freq: Two times a day (BID) | ORAL | 0 refills | Status: DC

## 2018-01-17 MED ORDER — GUAIFENESIN-DM 100-10 MG/5ML PO SYRP: 5.0000 mL | ORAL_SOLUTION | ORAL | 0 refills | Status: DC | PRN

## 2018-01-17 NOTE — Progress Notes (Signed)
[facility] Anita Mo, MD Phone: [phone]  Reason For Visit:  SDA for Cold  #Viral cold Presents with symptoms of cough and congestion for about 1 week.  She also indicates earlier in the week having a fever.  She states that she checked her temperature and it was 99.8.  She feels like she has had higher fevers but she took ibuprofen and Tylenol to help with that.  She currently has a bit of a sore throat.  She believes she has the flu.  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  #Depression Symptoms: Has had several years of feeling low.  However she states she has been having more consistent and more severe episodes of depression over the past several months.  She states that as a young adult she now has a lot more responsibilities including going to school and working which thinks has contributed to her symptoms of depression Age of onset of first mood disturbance: Current Duration of CURRENT symptoms: Several months  Impact on function: She feels like she is having difficulty coping with work and school  Psychiatric History - Diagnoses:None - Hospitalizations:  None - Pharmacotherapy None - Outpatient therapy:None  Family history of psychiatric issues: Not discussed  Current and history of substance use: Not discussed Medical conditions that might explain or contribute to symptoms: None  PHQ-9:15 GAD7:14 MDQ (if indicated): None     States that recently she has thought about hurting herself.  Yesterday she sold her ex-boyfriend is gone when she went over to his house.  She states that she did this more for to show that she felt hurt rather than to hurt herself.  She denies having any specific thoughts of how she would hurt herself.  When asked if she were to try to hurt herself in next month she states that this would be very unlikely.  I asked her if she still had access to the gone  and she stated that her mother took this away and she no longer has access.  Past Medical History Reviewed problem list.  Medications- reviewed and updated No additions to family history   Objective: BP 120/80 (BP Location: Left Arm, Patient Position: Sitting, Cuff Size: Normal)   Pulse 78   Temp 97.9 F (36.6 C) (Oral)   Wt 201 lb 3.2 oz (91.3 kg)   SpO2 100%   BMI 29.71 kg/m  Gen: NAD, alert, cooperative with exam HEENT: Normal    Neck: No masses palpated. No lymphadenopathy    Ears: Tympanic membranes intact, normal light reflex, no erythema, no bulging    Nose: nasal turbinates congested    Throat: Tonsil slightly enlarged on the right compared to the left, no exudate noted Cardio: regular rate and rhythm, S1S2 heard, no murmurs appreciated Pulm: clear to auscultation bilaterally, no wheezes, rhonchi or rales Skin: dry, intact, no rashes or lesions    Assessment/Plan: See problem based a/p  Viral URI with cough Positive strep.   Patient wanted flu test though no symptoms significant for flu-she stated she would like this just for work purposes - fluticasone (FLONASE) 50 MCG/ACT nasal spray; Place 2 sprays into both nostrils daily.  Dispense: 16 g; Refill: 6 - guaiFENesin-dextromethorphan (ROBITUSSIN DM) 100-10 MG/5ML syrup; Take 5 mLs by mouth every 4 (four) hours as needed for cough.  Dispense: 118 mL; Refill: 0 - POCT rapid strep A -  Influenza a and b   Depression Significant new onset depression and anxiety.  PHQ 9 of 15 and GAD 7 of 14, possibly brought on by social issues including recently breaking up with her boyfriend. Patient did note trying to steal her boyfriend's gun however states that this was more to express her self being upset rather than intentional desire to hurt her self.  She denies any specific intentions of SI, states that she is very unlikely to hurt her self.  Patient is not  currently interested in discussing medications and would prefer only  counseling for now -Patient to see behavioral medicine today -Follow-up in 1 week with PCP

## 2018-01-17 NOTE — Progress Notes (Signed)
ESTIMATE TIME:45 minutes Type of Service: [place] warm handoff  Interpreter:No.   Anita [name] is a 22 y.o. female referred by Dr. Emmaline Life for: symptoms of  anxiety and depression a well as relationship stressors.  Patient is pleasant and engaged in conversation. Patient reports :worrying, difficulty relaxing, feeling down and sleep disturbances. She believes this is all related to a recent break up with her boyfriend of five years as well as trying to balance school and work.   Current /Hx of mental health treatment & substance use: no prior mental health treatment, reports mother and father hx of depression and anxiety. Patient currently smoking marijuana 4 to 5 days a week, reports cutting back on drinking liquor.  Only drinks socially on the weekends.   Appearance:Well Groomed ; Thought process: Coherent; Affect: Appropriate and Tearful :Recent Suicidal ideation and thought that she would be better off not here but no plans to take her life. Patient reports it is very unlikely that she would take her life.  no SI today, no hx of SI attemps. No access to fire arms.   LIFE /SOCIAL :  patient lives with her mother, 2 minor sisters and mother's boyfriend ,father lives local and is also a support system.  Currently employed:Yes.   Daycare for past 3 years   currently in school :Yes.   Junior at [employer] do you do for fun? Enjoys hanging out with her friends Recent Life changes: recent break up with boyfriend, difficult with other friends, stress with job and school   GOALS: Patient will reduce symptoms of: anxiety, depression and stress, and increase  ability YK:[id number] skills, healthy habits, self-management skills and stress reduction, Increase healthy adjustment to current life circumstances. INTERVENTION: Reflective listening, Behavioral Therapy (Relaxed breathing), Brief Counseling/Psychotherapy and Psychoeducation    PHQ 9=15,indication of : severe  depression. GAD-7=14,indication of : severe anxiety.  ISSUES DISCUSSED: Integrated care services, support system, previous and current coping skills, community resources , things patient enjoy or use to enjoy doing, safety plan,  Unhealthy habits, work related stress & school stress, demonstration of relaxed breathing and cycle of depression.    ASSESSMENT:Patient currently experiencing symptoms of depression and anxiety due to difficulty adjusting to relationship breakup combined with work and school stress. Patient may benefit from, and is in agreement to receive further assessment and brief therapeutic interventions to assist with managing her symptoms.  PLAN:   1.Patient will F/U with LCSW in 2 weeks  2. Patient declined F/U phone call   3. Behavioral recommendations: relaxed breathing  4. Referral:*Integrated Behavioral Health Services (In Clinic)   Warm Hand Off Completed.     Casimer Lanius, LCSW Licensed Clinical Social Worker [place]   [phone] 10:09 AM

## 2018-01-17 NOTE — Patient Instructions (Signed)
I want to to continue taking Flonase and dextromethorphan.  This will help with the congestion and cough.  It may take up to 2 weeks for your viral cold to get better.  I am going to have you follow-up with Neoma Laming to discuss things more.  You will need to follow-up in 1 week with your primary care physician to discuss the issues we have been talking about today

## 2018-01-18 ENCOUNTER — Telehealth: Payer: Self-pay

## 2018-01-18 LAB — INFLUENZA A AND B
Influenza A Ag, EIA: NEGATIVE
Influenza B Ag, EIA: NEGATIVE

## 2018-01-18 LAB — PLEASE NOTE:

## 2018-01-18 NOTE — Assessment & Plan Note (Addendum)
Positive strep. Provided Amoxicilin Patient wanted flu test though no symptoms significant for flu-she stated she would like this just for work purposes - fluticasone (FLONASE) 50 MCG/ACT nasal spray; Place 2 sprays into both nostrils daily.  Dispense: 16 g; Refill: 6 - guaiFENesin-dextromethorphan (ROBITUSSIN DM) 100-10 MG/5ML syrup; Take 5 mLs by mouth every 4 (four) hours as needed for cough.  Dispense: 118 mL; Refill: 0 - POCT rapid strep A - Influenza a and b

## 2018-01-18 NOTE — Telephone Encounter (Signed)
Pt called wanting flu results. Pt informed flu test negative. Wallace Cullens, RN

## 2018-01-18 NOTE — Assessment & Plan Note (Addendum)
Significant new onset depression and anxiety.  PHQ 9 of 15 and GAD 7 of 14, possibly brought on by social issues including recently breaking up with her boyfriend. Patient did note trying to steal her boyfriend's gun however states that this was more to express her self being upset rather than intentional desire to hurt her self.  She denies any specific intentions of SI, states that she is very unlikely to hurt her self.  Patient is not  currently interested in discussing medications and would prefer only counseling for now -Patient to see behavioral medicine today -Follow-up in 1 week with PCP

## 2018-01-22 ENCOUNTER — Telehealth: Payer: Self-pay | Admitting: Family Medicine

## 2018-01-22 ENCOUNTER — Ambulatory Visit: Payer: BLUE CROSS/BLUE SHIELD | Admitting: Family Medicine

## 2018-01-22 NOTE — Telephone Encounter (Signed)
She was here last week and tested positive for strep throat.  She is needing a note for work stating the day she was here and the diagnosis of strep, faxed to [place] [phone].  thanks

## 2018-01-23 ENCOUNTER — Encounter: Payer: Self-pay | Admitting: Family Medicine

## 2018-01-23 NOTE — Telephone Encounter (Signed)
Letter written and given to Anita [name]

## 2018-01-23 NOTE — Telephone Encounter (Signed)
Called her cell  She related could not come Tuesday because of work  She was doing "ok" bu would like to come in to talk.  I let her know she could call me or Hilda Blades if things worsened She agreed and will make an appointment for next week

## 2018-01-23 NOTE — Progress Notes (Signed)
Subjective  Patient is presenting with the following illnesses     Chief Complaint noted Review of Symptoms - see HPI PMH - Smoking status noted.     Objective Vital Signs reviewed     Assessments/Plans  No problem-specific Assessment & Plan notes found for this encounter.   See after visit summary for details of patient instuctions 

## 2018-01-30 ENCOUNTER — Ambulatory Visit: Payer: BLUE CROSS/BLUE SHIELD | Admitting: Licensed Clinical Social Worker

## 2018-01-30 DIAGNOSIS — F4323 Adjustment disorder with mixed anxiety and depressed mood: Secondary | ICD-10-CM

## 2018-01-30 NOTE — Progress Notes (Signed)
Type of Service: [place] 2nd Visit Total time:40 minutes :  Interpreter:No.    Reason for follow-up: Continue brief intervention to assist patient with managing symptoms of anxiety and depression, as well as managing life transition stressors . Reports somewhat difficulty in daily functions. Patient is pleasant, smiling and engaged in conversation.   Appearance:Neat ; Thought process: Coherent; Affect: Appropriate: No plan to harm self or others or thoughts. Patient states she is trying to move forward from the relationship break up, has implemented relaxed breathing, returned to school and is staying busy.  Patient is making progress towards goal.  States she feelings better today than the last time she was in the office. This is also evident by PHQ-9/GAD score. GAD 7 : Generalized Anxiety Score 01/31/2018 01/18/2018  Nervous, Anxious, on Edge 1 3  Control/stop worrying 3 3  Worry too much - different things 2 3  Trouble relaxing 2 2  Restless 0 0  Easily annoyed or irritable 0 1  Afraid - awful might happen 2 2  Total GAD 7 Score 10 14  Anxiety Difficulty Somewhat difficult Somewhat difficult   Depression screen [facility] 2/9 01/31/2018 01/18/2018 08/07/2017  Decreased Interest 2 2 0  Down, Depressed, Hopeless 1 2 0  PHQ - 2 Score 3 4 0  Altered sleeping 2 2 -  Tired, decreased energy 1 2 -  Change in appetite 2 2 -  Feeling bad or failure about yourself  1 3 -  Trouble concentrating 1 0 -  Moving slowly or fidgety/restless 0 0 -  Suicidal thoughts 0 2 -  PHQ-9 Score 10 15 -  Difficult doing work/chores Somewhat difficult - -   Goals: Patient will reduce symptoms of: anxiety and depression , and increase  ability [id number] skills and stress reduction, Increase healthy adjustment to current life circumstances. Intervention: Motivational Interviewing,  Brief Counseling/Psychotherapy, Problem-solving teaching/coping strategies and Psychoeducation   Issues discussed:  pro/con of new job offer ; additional job interviews; moving forward without relationship ;  Support system ; pro/con of leaving current job. Assessment:Patient continues to experience symptoms of anxiety and depression.  Symptoms exacerbated by 5 year relationship breakup and having to work with ex-boyfriends mother and other family members on her job.  Patient may benefit from, and is in agreement to continue further assessment and brief therapeutic interventions to assist with managing her symptoms.  Plan: 1. Patient will F/U with LCSW in 2 weeks 2. Behavioral recommendations: continue relaxed breathing and behavioral Activation 3. Referral:  None at this time  Casimer Lanius, LCSW Licensed Clinical Social Worker [place]   [phone] 11:51 AM

## 2018-02-13 ENCOUNTER — Ambulatory Visit: Payer: BLUE CROSS/BLUE SHIELD | Admitting: Women's Health

## 2018-02-13 VITALS — BP 116/68 | Ht 68.0 in | Wt 197.8 lb

## 2018-02-13 DIAGNOSIS — Z01419 Encounter for gynecological examination (general) (routine) without abnormal findings: Secondary | ICD-10-CM

## 2018-02-13 DIAGNOSIS — N898 Other specified noninflammatory disorders of vagina: Secondary | ICD-10-CM | POA: Diagnosis not present

## 2018-02-13 LAB — CBC WITH DIFFERENTIAL/PLATELET
Basophils Absolute: 77 cells/uL (ref 0–200)
Basophils Relative: 0.9 %
EOS PCT: 3.6 %
Eosinophils Absolute: 306 cells/uL (ref 15–500)
HEMATOCRIT: 40.4 % (ref 35.0–45.0)
Hemoglobin: 14.1 g/dL (ref 11.7–15.5)
LYMPHS ABS: 2644 {cells}/uL (ref 850–3900)
MCH: 29.4 pg (ref 27.0–33.0)
MCHC: 34.9 g/dL (ref 32.0–36.0)
MCV: 84.2 fL (ref 80.0–100.0)
MPV: 9.3 fL (ref 7.5–12.5)
Monocytes Relative: 10.9 %
NEUTROS ABS: 4548 {cells}/uL (ref 1500–7800)
NEUTROS PCT: 53.5 %
Platelets: 311 10*3/uL (ref 140–400)
RBC: 4.8 10*6/uL (ref 3.80–5.10)
RDW: 13 % (ref 11.0–15.0)
Total Lymphocyte: 31.1 %
WBC mixed population: 927 cells/uL (ref 200–950)
WBC: 8.5 10*3/uL (ref 3.8–10.8)

## 2018-02-13 LAB — WET PREP FOR TRICH, YEAST, CLUE

## 2018-02-13 MED ORDER — FLUCONAZOLE 150 MG PO TABS: 150.0000 mg | ORAL_TABLET | Freq: Once | ORAL | 1 refills | Status: AC

## 2018-02-13 NOTE — Patient Instructions (Signed)

## 2018-02-13 NOTE — Progress Notes (Signed)
Anita [name] 1995-11-26 [id number]    History:    Presents for annual exam.  09/2017 nexplanon.  Gardasil series completed.   01/17/2018 strep infection, tonsillitis has follow-up scheduled, has had 3 bouts of strep tonsillitis in the last 4 months..  Reports negative STD screen at health department, denies need for further screening.  Past medical history, past surgical history, family history and social history were all reviewed and documented in the EPIC chart.  Full-time work and school at [employer], Games developer, Product manager career goal.  History of an appendectomy.  Parents healthy.  ROS:  A ROS was performed and pertinent positives and negatives are included.  Exam:  Vitals:   02/13/18 1541  BP: 116/68  Weight: 197 lb 12.8 oz (89.7 kg)  Height: 5\' 8"  (1.727 m)   Body mass index is 30.08 kg/m.   General appearance:  Normal Thyroid:  Symmetrical, normal in size, without palpable masses or nodularity. Respiratory  Auscultation:  Clear without wheezing or rhonchi Cardiovascular  Auscultation:  Regular rate, without rubs, murmurs or gallops  Edema/varicosities:  Not grossly evident Abdominal  Soft,nontender, without masses, guarding or rebound.  Liver/spleen:  No organomegaly noted  Hernia:  None appreciated  Skin  Inspection:  Grossly normal   Breasts: Examined lying and sitting.     Right: Without masses, retractions, discharge or axillary adenopathy.     Left: Without masses, retractions, discharge or axillary adenopathy. Gentitourinary   Inguinal/mons:  Normal without inguinal adenopathy  External genitalia:  Normal  BUS/Urethra/Skene's glands:  Normal  Vagina:  Normal  Cervix:  Normal  Uterus:  normal in size, shape and contour.  Midline and mobile  Adnexa/parametria:     Rt: Without masses or tenderness.   Lt: Without masses or tenderness.  Anus and perineum: Normal    Assessment/Plan:  22 y.o. SWF G0 for annual exam with complaint of  vaginal discharge.  05/2017 nexplanon irregular bleeding Obesity  Plan: Reviewed normality of wet prep and exam, prescription for Diflucan 150 given has been on antibiotics several times for strep tonsillitis has follow-up scheduled with primary.  Has had problems with recurrent yeast and BV would like to try something prophylactic will try boric acid gelcaps 600 mg twice weekly per vagina prescription will be called into [facility].  SBE's, exercise, calcium rich foods, MVI daily encouraged.  Campus safety reviewed.  Decrease calories/carbs for weight loss encouraged.  CBC.   [place], 4:45 PM 02/13/2018

## 2018-02-14 ENCOUNTER — Other Ambulatory Visit: Payer: Self-pay | Admitting: Women's Health

## 2018-02-14 ENCOUNTER — Ambulatory Visit: Payer: BLUE CROSS/BLUE SHIELD

## 2018-02-14 MED ORDER — NONFORMULARY OR COMPOUNDED ITEM: 1 refills | Status: DC

## 2018-02-14 NOTE — Progress Notes (Deleted)
   [facility] Phone: [phone]   Date of Visit: 02/15/2018   HPI:  ***  ROS: See HPI.  Slaughters: ***  PHYSICAL EXAM: There were no vitals taken for this visit. Gen: *** HEENT: *** Heart: *** Lungs: *** Neuro: *** Ext: ***  ASSESSMENT/PLAN:  Health maintenance:  -***  No problem-specific Assessment & Plan notes found for this encounter.  FOLLOW UP: Follow up in *** for ***  Smiley Houseman, MD PGY [place]

## 2018-02-15 ENCOUNTER — Ambulatory Visit: Payer: BLUE CROSS/BLUE SHIELD | Admitting: Internal Medicine

## 2018-02-18 ENCOUNTER — Ambulatory Visit: Payer: BLUE CROSS/BLUE SHIELD

## 2018-02-19 ENCOUNTER — Ambulatory Visit (INDEPENDENT_AMBULATORY_CARE_PROVIDER_SITE_OTHER): Payer: BLUE CROSS/BLUE SHIELD | Admitting: Licensed Clinical Social Worker

## 2018-02-19 ENCOUNTER — Ambulatory Visit (INDEPENDENT_AMBULATORY_CARE_PROVIDER_SITE_OTHER): Payer: BLUE CROSS/BLUE SHIELD

## 2018-02-19 DIAGNOSIS — Z111 Encounter for screening for respiratory tuberculosis: Secondary | ICD-10-CM

## 2018-02-19 DIAGNOSIS — F4323 Adjustment disorder with mixed anxiety and depressed mood: Secondary | ICD-10-CM

## 2018-02-19 NOTE — Progress Notes (Signed)
Type of Service: Integrated Behavioral Health 4th F/U Visit Total time:30 minutes :  Interpreter:No.    Reason for follow-up: Continue brief intervention to assist patient with managing symptoms of anxiety and depression, as well as managing stressors . Reports no difficulty in daily functions. Patient is pleasant and engaged in conversation.   Appearance:Neat ; Thought process: Coherent; Affect: Appropriate.  Patient is making progress towards goal.  States she is feeling better.   Goals: Patient will reduce symptoms of: anxiety, depression and stress , and increase  ability [id number] skills and stress reduction, . Intervention: Motivational Interviewing and Supportive Counseling, Reflective listening, Behavioral Therapy (Relaxed breathing),    Issues discussed: new job  ; transition from old job ;  Managing work/school; and self-care.  Assessment:Patient is making progress of managing life transitions and stressors.  Patient may benefit from, and is in agreement to continue further assessment and brief therapeutic interventions to assist with managing her symptoms, however new work schedule may prevent her from further intervention.  Plan: 1. Patient will F/U with LCSW as needed based on work schedule 2. Behavioral recommendations: continue relaxed breathing 3. Referral: none at this time  Casimer Lanius, LCSW Licensed Clinical Social Worker [place]   [phone] 2:18 PM

## 2018-02-19 NOTE — Progress Notes (Signed)
Tuberculin skin test applied to left ventral forearm.  Patient informed to schedule appt for nurse visit in 48-72 hours to have site read.  Anita [name] B Ethelle Ola, RN   

## 2018-02-20 ENCOUNTER — Telehealth: Payer: Self-pay

## 2018-02-20 NOTE — Telephone Encounter (Signed)
Attempted to call patient to schedule and appointment for a [place] Offer/ Pre employment form. Per Dr. Erin Hearing, he has not seen patient in over a year and before he will fill out form he needs to see patient.  No answer and the voice mailbox is full.  Patient has a nurse visit to read a PPD on 02/21/2018,I will place form back with Ailene Ravel to relay message to patient.Ozella Almond, CMA

## 2018-02-22 ENCOUNTER — Ambulatory Visit: Payer: BLUE CROSS/BLUE SHIELD

## 2018-02-22 DIAGNOSIS — Z111 Encounter for screening for respiratory tuberculosis: Secondary | ICD-10-CM

## 2018-02-22 LAB — TB SKIN TEST
INDURATION: 0 mm
TB SKIN TEST: NEGATIVE

## 2018-02-22 NOTE — Progress Notes (Signed)
   Patient here today to have PPD site read.   PPD read and results entered in Epic. Result: 0 mm induration. Interpretation: Negative Letter given. Danley Danker, RN [facility] RN)

## 2018-02-27 ENCOUNTER — Ambulatory Visit: Payer: BLUE CROSS/BLUE SHIELD

## 2018-03-13 ENCOUNTER — Encounter: Payer: BLUE CROSS/BLUE SHIELD | Admitting: Family Medicine

## 2018-03-26 ENCOUNTER — Ambulatory Visit (INDEPENDENT_AMBULATORY_CARE_PROVIDER_SITE_OTHER): Payer: BLUE CROSS/BLUE SHIELD | Admitting: Family Medicine

## 2018-03-26 ENCOUNTER — Encounter: Payer: Self-pay | Admitting: Family Medicine

## 2018-03-26 ENCOUNTER — Other Ambulatory Visit (HOSPITAL_COMMUNITY)
Admission: RE | Admit: 2018-03-26 | Discharge: 2018-03-26 | Disposition: A | Discharge status: Home / Self Care | Payer: BLUE CROSS/BLUE SHIELD | Source: Ambulatory Visit | Attending: Family Medicine | Admitting: Family Medicine

## 2018-03-26 VITALS — BP 115/80 | HR 82 | Temp 98.0°F | Wt 202.0 lb

## 2018-03-26 DIAGNOSIS — N898 Other specified noninflammatory disorders of vagina: Secondary | ICD-10-CM | POA: Insufficient documentation

## 2018-03-26 LAB — POCT WET PREP (WET MOUNT)
Clue Cells Wet Prep Whiff POC: NEGATIVE
Trichomonas Wet Prep HPF POC: ABSENT

## 2018-03-26 MED ORDER — FLUCONAZOLE 150 MG PO TABS: 150.0000 mg | ORAL_TABLET | Freq: Once | ORAL | 0 refills | Status: AC

## 2018-03-26 NOTE — Patient Instructions (Signed)
IIt was good to see you today.  Thank you for coming in.  Your Goals to stay Healthy  - Consider more fruits and veggies  -Consider walking  Continue the stress reduction deep breathing.  Call Ms Laurance Flatten as needed  I will call you if your tests are not good.  Otherwise I will send you a letter.  If you do not hear from me with in 2 weeks please call our office.      Be Well

## 2018-03-26 NOTE — Assessment & Plan Note (Signed)
Clinically most likely yeast wet prep negative. Since she is not really symptomatic asked her to wait two days to see if clears.  If does not to use diflucan.  Await cultures

## 2018-03-26 NOTE — Progress Notes (Signed)
Subjective  Anita [name] is a 22 y.o. female is presenting with the following  VAGINAL DISCHARGE  Having vaginal discharge for 2 days. Discharge consistency: thick Discharge color: white Medications tried: non  Recent antibiotic use: no Sex in last month: yes Possible STD exposure:possible uses condom  Symptoms Fever: no Dysuria:no Vaginal bleeding: no Abdomen or Pelvic pain: no Back pain: no Genital sores or ulcers:no Rash: no Pain during sex: no Missed menstrual period: no - Implanon  ROS see HPI Smoking Status noted  Patient reports no  vision/ hearing changes,anorexia, weight change, fever ,adenopathy, persistant / recurrent hoarseness, swallowing issues, chest pain, edema,persistant / recurrent cough, hemoptysis, dyspnea(rest, exertional, paroxysmal nocturnal), gastrointestinal  bleeding (melena, rectal bleeding), abdominal pain, excessive heart burn, GU symptoms(dysuria, hematuria, pyuria, voiding/incontinence  Issues) syncope, focal weakness, severe memory loss, concerning skin lesions, depression, anxiety, abnormal bruising/bleeding, major joint swelling, breast masses or abnormal vaginal bleeding.     Chief Complaint noted Review of Symptoms - see HPI PMH - Smoking status noted.    Objective Vital Signs reviewed BP 115/80   Pulse 82   Temp 98 F (36.7 C)   Wt 202 lb (91.6 kg)   SpO2 100%   BMI 30.71 kg/m  Heart - Regular rate and rhythm.  No murmurs, gallops or rubs.    Lungs:  Normal respiratory effort, chest expands symmetrically. Lungs are clear to auscultation, no crackles or wheezes. Skin:  Intact without suspicious lesions or rashes Neck:  No deformities, thyromegaly, masses, or tenderness noted.   Supple with full range of motion without pain. Genitalia:  Normal introitus for age, no external lesions, white scant vaginal discharge, mucosa pink and moist, no vaginal or cervical lesions, no vaginal atrophy, no friaility or hemorrhage,    Assessments/Plans  See after visit summary for details of patient instuctions  Vaginal discharge Clinically most likely yeast wet prep negative. Since she is not really symptomatic asked her to wait two days to see if clears.  If does not to use diflucan.  Await cultures   HM - discussed exercise and eating healthy

## 2018-03-27 LAB — CERVICOVAGINAL ANCILLARY ONLY
Chlamydia: NEGATIVE
Neisseria Gonorrhea: NEGATIVE

## 2018-03-28 ENCOUNTER — Encounter: Payer: Self-pay | Admitting: Family Medicine

## 2018-04-05 DIAGNOSIS — Z5321 Procedure and treatment not carried out due to patient leaving prior to being seen by health care provider: Secondary | ICD-10-CM | POA: Diagnosis not present

## 2018-04-05 DIAGNOSIS — M545 Low back pain: Secondary | ICD-10-CM | POA: Diagnosis not present

## 2018-04-05 DIAGNOSIS — M5489 Other dorsalgia: Secondary | ICD-10-CM | POA: Diagnosis not present

## 2018-04-05 DIAGNOSIS — R52 Pain, unspecified: Secondary | ICD-10-CM | POA: Diagnosis not present

## 2018-04-05 NOTE — ED Triage Notes (Signed)
Pt brought in by EMS for c/o back pain  Pt states it started in her lower back and started moving upward to the mid part of her back  Rates pain at a 9/10  Denies injury

## 2018-04-06 ENCOUNTER — Other Ambulatory Visit: Payer: Self-pay

## 2018-04-06 ENCOUNTER — Encounter (HOSPITAL_COMMUNITY): Payer: Self-pay | Admitting: Emergency Medicine

## 2018-04-06 ENCOUNTER — Emergency Department (HOSPITAL_COMMUNITY)
Admission: EM | Admit: 2018-04-06 | Discharge: 2018-04-06 | Disposition: A | Discharge status: Home / Self Care | Payer: BLUE CROSS/BLUE SHIELD | Attending: Emergency Medicine | Admitting: Emergency Medicine

## 2018-04-06 NOTE — ED Notes (Signed)
Bed: WA07 Expected date:  Expected time:  Means of arrival:  Comments: 

## 2018-04-06 NOTE — ED Notes (Addendum)
Patient called to a room and no answer x 3

## 2018-04-11 ENCOUNTER — Encounter: Payer: Self-pay | Admitting: Internal Medicine

## 2018-04-11 ENCOUNTER — Ambulatory Visit (INDEPENDENT_AMBULATORY_CARE_PROVIDER_SITE_OTHER): Payer: BLUE CROSS/BLUE SHIELD | Admitting: Internal Medicine

## 2018-04-11 ENCOUNTER — Other Ambulatory Visit: Payer: Self-pay

## 2018-04-11 VITALS — BP 110/70 | HR 68 | Temp 97.6°F | Ht 68.0 in | Wt 203.0 lb

## 2018-04-11 DIAGNOSIS — R21 Rash and other nonspecific skin eruption: Secondary | ICD-10-CM

## 2018-04-11 MED ORDER — TRIAMCINOLONE ACETONIDE 0.1 % EX OINT: 1.0000 "application " | TOPICAL_OINTMENT | Freq: Two times a day (BID) | CUTANEOUS | 0 refills | Status: DC

## 2018-04-11 NOTE — Assessment & Plan Note (Signed)
Unclear etiology. Favor insect bites given that she has three itchy papules. Appearance is not consistent with poison ivy. Could also be early Hand Foot and Mouth, given that she has been exposed to this, although she has not had any other symptoms and does not feel sick. - Will prescribe Triamcinolone ointment bid to help with itching - If she develops fevers/chills, fatigue, myalagias, and lesions on her mouth/hands/feet, advised patient to drink plenty of fluids, stay out of work, and use Tylenol and Motrin as needed - Return precautions discussed - Follow-up if rash worsens

## 2018-04-11 NOTE — Patient Instructions (Signed)
It was so nice to meet you!  I am not sure what is causing the bumps, but the fact that they are itchy makes me think that these are insect bites. I have prescribed a steroid ointment to help with the itching. Please use this twice a day until the spots go away.  This may also be early Hand Foot and Mouth, but typically adults feel really sick if they have this illness. If you start feeling bad and having fevers and chills, I would recommend drinking plenty of fluids and using Tylenol and Ibuprofen around the clock. You should also stay out of work.  -Dr. Brett Albino

## 2018-04-11 NOTE — Progress Notes (Signed)
   [facility] Phone: [phone]  Subjective:  Anita [name] is a 22 year old female presenting to clinic with red bumps on her skin. Yesterday, she noticed a red bump on her right lower back/posterior hip area. The bump was itchy. She then noticed a second bump form in that same area. The bump formed a blister and then popped on its own. She then noticed a third bump on her left anterior/inner thigh. That bump was also initially a blister and is now itchy. She works in a daycare and is worried that she may have [place] and Mouth disease because about half of her class is home sick with this. She has not had any lesions of her hands, feet, or mouth. She has otherwise been feeling fine. No fevers, no chills, no fatigue. No known exposure to poison ivy. She does not think she has gotten bit by any mosquitos.  ROS: See HPI for pertinent positives and negatives  Past Medical History- migraines, depression  Family history reviewed for today's visit. No changes.  Social history- patient is a never smoker  Objective: BP 110/70   Pulse 68   Temp 97.6 F (36.4 C) (Oral)   Ht 5\' 8"  (1.727 m)   Wt 203 lb (92.1 kg)   SpO2 98%   BMI 30.87 kg/m  Gen: NAD, alert, cooperative with exam HEENT: NCAT, EOMI, MMM, no oral lesions Skin: two erythematous papules present over the posterior right hip/right low back and one papule present on the left anterior/inner thigh; no drainage; no vesicles or blisters; no lesions present on the hands or feet.  Assessment/Plan: Rash: Unclear etiology. Favor insect bites given that she has three itchy papules. Appearance is not consistent with poison ivy. Could also be early Hand Foot and Mouth, given that she has been exposed to this, although she has not had any other symptoms and does not feel sick. - Will prescribe Triamcinolone ointment bid to help with itching - If she develops fevers/chills, fatigue, myalagias, and lesions on her  mouth/hands/feet, advised patient to drink plenty of fluids, stay out of work, and use Tylenol and Motrin as needed - Return precautions discussed - Follow-up if rash worsens   Hyman Bible, MD PGY-3

## 2018-07-31 ENCOUNTER — Encounter: Payer: Self-pay | Admitting: Women's Health

## 2018-07-31 ENCOUNTER — Ambulatory Visit: Payer: BLUE CROSS/BLUE SHIELD | Admitting: Women's Health

## 2018-07-31 VITALS — BP 122/80

## 2018-07-31 DIAGNOSIS — R300 Dysuria: Secondary | ICD-10-CM

## 2018-07-31 DIAGNOSIS — Z113 Encounter for screening for infections with a predominantly sexual mode of transmission: Secondary | ICD-10-CM

## 2018-07-31 DIAGNOSIS — Z23 Encounter for immunization: Secondary | ICD-10-CM | POA: Diagnosis not present

## 2018-07-31 DIAGNOSIS — N898 Other specified noninflammatory disorders of vagina: Secondary | ICD-10-CM

## 2018-07-31 LAB — WET PREP FOR TRICH, YEAST, CLUE

## 2018-07-31 NOTE — Progress Notes (Signed)
22 year old SBF G0 presents with complaint of external vaginal irritation, no visible discharge, questionable vaginal infection.  Denies vaginal odor, urinary symptoms, abdominal or back pain, or fever.  Currently not sexually active last partner last month.  09/2017 Nexplanon,  regular cycle in June, 1 day cycle in September.  Gardasil series completed.  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  Reports negative STD screen at health department in April.  Exam: Appears well.  No CVAT.  Abdomen soft without rebound or radiation, external genitalia no erythema, lesions, speculum exam scant discharge, no odor noted, wet prep negative.  GC/Chlamydia culture taken.  Bimanual no CMT or adnexal tenderness. UA: +1 blood, +1 protein, negative nitrites, negative leukocytes, no WBCs, 3-10 RBCs, 10-20 squamous epithelials, moderate bacteria  Vaginal irritation with normal exam  Plan: Encouraged loose clothing, reviewed normality of exam and wet prep, urine culture pending.  Condoms encouraged until permanent partner or abstinence.  Could try over-the-counter A&D ointment after showers to external genitalia.

## 2018-07-31 NOTE — Progress Notes (Signed)
ab 

## 2018-08-01 LAB — C. TRACHOMATIS/N. GONORRHOEAE RNA
C. trachomatis RNA, TMA: NOT DETECTED
N. gonorrhoeae RNA, TMA: NOT DETECTED

## 2018-08-02 LAB — URINE CULTURE
MICRO NUMBER: 91125656
Result:: NO GROWTH
SPECIMEN QUALITY:: ADEQUATE

## 2018-08-02 LAB — CULTURE INDICATED

## 2018-08-02 LAB — URINALYSIS, COMPLETE W/RFL CULTURE
Bilirubin Urine: NEGATIVE
GLUCOSE, UA: NEGATIVE
HYALINE CAST: NONE SEEN /LPF
Leukocyte Esterase: NEGATIVE
Nitrites, Initial: NEGATIVE
Specific Gravity, Urine: 1.025 (ref 1.001–1.03)
WBC, UA: NONE SEEN /HPF (ref 0–5)
pH: 6 (ref 5.0–8.0)

## 2018-10-01 ENCOUNTER — Telehealth: Payer: Self-pay | Admitting: *Deleted

## 2018-10-01 NOTE — Telephone Encounter (Signed)
Patient called regarding irregular bleeding, has Nexplanon, had 7 day cycle on 09/14/18. Then started bleeding again on 09/24/18 still having some form of bleeding now, wearing panty liner with light blood, notes but when wiping she noticed more blood, but she doesn't appear to have a flow.  She is sexually active, using condoms. States this happened before and a pill was prescribed to stop bleeding. Please advise

## 2018-10-01 NOTE — Telephone Encounter (Signed)
TC states has a new partner since last STD screen, reviewed  office visit best to check for infection prior to treating irregular bleeding with Nexplanon.  States will schedule appointment.

## 2018-10-09 ENCOUNTER — Encounter: Payer: Self-pay | Admitting: Women's Health

## 2018-10-09 ENCOUNTER — Ambulatory Visit: Payer: BLUE CROSS/BLUE SHIELD | Admitting: Women's Health

## 2018-10-09 VITALS — BP 122/80

## 2018-10-09 DIAGNOSIS — Z113 Encounter for screening for infections with a predominantly sexual mode of transmission: Secondary | ICD-10-CM | POA: Diagnosis not present

## 2018-10-09 DIAGNOSIS — N926 Irregular menstruation, unspecified: Secondary | ICD-10-CM

## 2018-10-09 LAB — WET PREP FOR TRICH, YEAST, CLUE

## 2018-10-09 MED ORDER — MEGESTROL ACETATE 40 MG PO TABS: 40.0000 mg | ORAL_TABLET | Freq: Two times a day (BID) | ORAL | 0 refills | Status: DC

## 2018-10-09 NOTE — Progress Notes (Signed)
22 year old S WF G0 presents with complaint of irregular bleeding, spotting after intercourse, occasional discomfort with intercourse, heavy bleeding last week that has now basically resolved.  Had a 7-day cycle 09/14/2018 and then started bleeding again like a regular cycle on 09/24/2018 with continued spotting until yesterday.  New partner.  Denies urinary symptoms, visible discharge, abdominal pain or fever.  09/2017 Nexplanon has had some irregular light bleeding since insertion but last week very heavy with clots.  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 .  Exam: Appears well.  No CVAT.  Abdomen soft, nontender, no rebound or radiation.  External genitalia within normal limits, speculum exam no visible blood, discharge or erythema noted.  Wet prep negative.  GC/Chlamydia culture taken.  Bimanual no CMT or adnexal tenderness.  STD screen Irregular bleeding with Nexplanon  Plan: Reviewed bleeding has stopped, reviewed if occurs again can try Megace.  Will watch at this time.  Will check HIV, hepatitis and RPR and annual exam.  Condoms encouraged until permanent partner.  GC/Chlamydia culture pending.

## 2018-10-10 LAB — C. TRACHOMATIS/N. GONORRHOEAE RNA
C. TRACHOMATIS RNA, TMA: NOT DETECTED
N. gonorrhoeae RNA, TMA: NOT DETECTED

## 2018-10-17 ENCOUNTER — Telehealth: Payer: Self-pay | Admitting: *Deleted

## 2018-10-17 MED ORDER — SULFAMETHOXAZOLE-TRIMETHOPRIM 800-160 MG PO TABS: 1.0000 | ORAL_TABLET | Freq: Two times a day (BID) | ORAL | 0 refills | Status: DC

## 2018-10-17 NOTE — Telephone Encounter (Signed)
Rx sent- patient aware.  

## 2018-10-17 NOTE — Telephone Encounter (Signed)
(  you are back up md) Patient called c/o UTI urgency, burning with urination, at work unable to get off. Asked if you would be willing to prescribed Rx via phone? I explained OV for UTI. Please advise

## 2018-10-17 NOTE — Telephone Encounter (Signed)
Septra DS 1 p.o. twice daily x3 days

## 2019-02-14 ENCOUNTER — Other Ambulatory Visit: Payer: Self-pay

## 2019-02-17 ENCOUNTER — Encounter: Payer: BLUE CROSS/BLUE SHIELD | Admitting: Women's Health

## 2019-03-04 ENCOUNTER — Other Ambulatory Visit: Payer: Self-pay

## 2019-03-05 ENCOUNTER — Ambulatory Visit: Payer: BLUE CROSS/BLUE SHIELD | Admitting: Women's Health

## 2019-03-05 ENCOUNTER — Encounter: Payer: Self-pay | Admitting: Women's Health

## 2019-03-05 VITALS — BP 116/70 | Ht 68.0 in | Wt 216.0 lb

## 2019-03-05 DIAGNOSIS — Z01419 Encounter for gynecological examination (general) (routine) without abnormal findings: Secondary | ICD-10-CM | POA: Diagnosis not present

## 2019-03-05 LAB — CBC WITH DIFFERENTIAL/PLATELET
Absolute Monocytes: 684 cells/uL (ref 200–950)
Basophils Absolute: 32 cells/uL (ref 0–200)
Basophils Relative: 0.6 %
Eosinophils Absolute: 133 cells/uL (ref 15–500)
Eosinophils Relative: 2.5 %
HCT: 43.5 % (ref 35.0–45.0)
Hemoglobin: 14.9 g/dL (ref 11.7–15.5)
Lymphs Abs: 2253 cells/uL (ref 850–3900)
MCH: 29.6 pg (ref 27.0–33.0)
MCHC: 34.3 g/dL (ref 32.0–36.0)
MCV: 86.5 fL (ref 80.0–100.0)
MPV: 9.5 fL (ref 7.5–12.5)
Monocytes Relative: 12.9 %
Neutro Abs: 2200 cells/uL (ref 1500–7800)
Neutrophils Relative %: 41.5 %
Platelets: 350 10*3/uL (ref 140–400)
RBC: 5.03 10*6/uL (ref 3.80–5.10)
RDW: 13.1 % (ref 11.0–15.0)
Total Lymphocyte: 42.5 %
WBC: 5.3 10*3/uL (ref 3.8–10.8)

## 2019-03-05 NOTE — Addendum Note (Signed)
Addended by: Thurnell Garbe A on: 03/05/2019 11:34 AM   Modules accepted: Orders

## 2019-03-05 NOTE — Patient Instructions (Signed)

## 2019-03-05 NOTE — Progress Notes (Signed)
Anita [name] 1995-11-23 [id number]    History:    Presents for annual exam.  09/2017 Nexplanon with a light monthly cycle.  Same partner with negative STD screen.  Gardasil series completed.  Past medical history, past surgical history, family history and social history were all reviewed and documented in the EPIC chart.  Attending [employer] on academic scholarship, guidance counselor goal.  Parents healthy.  History of an appendectomy.  ROS:  A ROS was performed and pertinent positives and negatives are included.  Exam:  Vitals:   03/05/19 0927  BP: 116/70  Weight: 216 lb (98 kg)  Height: 5\' 8"  (1.727 m)   Body mass index is 32.84 kg/m.   General appearance:  Normal Thyroid:  Symmetrical, normal in size, without palpable masses or nodularity. Respiratory  Auscultation:  Clear without wheezing or rhonchi Cardiovascular  Auscultation:  Regular rate, without rubs, murmurs or gallops  Edema/varicosities:  Not grossly evident Abdominal  Soft,nontender, without masses, guarding or rebound.  Liver/spleen:  No organomegaly noted  Hernia:  None appreciated  Skin  Inspection:  Grossly normal   Breasts: Examined lying and sitting.     Right: Without masses, retractions, discharge or axillary adenopathy.     Left: Without masses, retractions, discharge or axillary adenopathy. Gentitourinary   Inguinal/mons:  Normal without inguinal adenopathy  External genitalia:  Normal  BUS/Urethra/Skene's glands:  Normal  Vagina:  Normal  Cervix:  Normal  Uterus:  normal in size, shape and contour.  Midline and mobile  Adnexa/parametria:     Rt: Without masses or tenderness.   Lt: Without masses or tenderness.  Anus and perineum: Normal    Assessment/Plan:  23 y.o. S WF G0 for annual exam with no complaints.  09/2017 Nexplanon monthly light cycle Obesity  Plan: Aware Nexplanon is good for 3 years.  SBEs,  calcium rich foods, MVI daily, reviewed importance of increasing  exercise decreasing calorie/carbs encouraged.  Campus safety reviewed.  CBC, Pap.  Reviewed new screening guidelines.    [place] [facility], 9:49 AM 03/05/2019

## 2019-03-06 LAB — PAP IG W/ RFLX HPV ASCU

## 2019-05-02 ENCOUNTER — Telehealth: Payer: Self-pay | Admitting: Licensed Clinical Social Worker

## 2019-05-02 NOTE — Telephone Encounter (Signed)
Care Coordination  Telephone Outreach Note 05/02/2019 Name: GWYNN CHALKER MRN: [id number] DOB: [birth year]  LCSW returned patient's phone call.  Patient reports wanting to connect with therapy however [facility] does not take her insurance.   Intervention: discussed possible options with patient.  Plan: Patient will contact her insurance provider for a list of in network providers.  Casimer Lanius, LCSW [facility]   [phone] 1:40 PM

## 2019-06-22 ENCOUNTER — Telehealth: Payer: Self-pay | Admitting: Obstetrics and Gynecology

## 2019-06-22 NOTE — Telephone Encounter (Signed)
After hours phone call returned to patient. [phone]  She states looked it up and thinks she has a Bartholin cyst.   Notes a large lump at the opening of her vagina starting in the last hour.   States is it sore with walking and sitting down. . Notes some drainage but denies pus.  No fever.  No flu-like symptoms.  I agree that it sounds like a Bartholin's cyst. I recommend that she go today to the [facility] or [facility] ER if she is having significant pain, fever, or malaise. Otherwise, she can call the office in the am for an appointment.  Use warm compresses.  Cc- Dr. Phineas Real

## 2019-06-23 ENCOUNTER — Ambulatory Visit: Payer: BLUE CROSS/BLUE SHIELD | Admitting: Women's Health

## 2019-06-23 ENCOUNTER — Encounter: Payer: Self-pay | Admitting: Women's Health

## 2019-06-23 ENCOUNTER — Other Ambulatory Visit: Payer: Self-pay

## 2019-06-23 VITALS — BP 120/78

## 2019-06-23 DIAGNOSIS — N764 Abscess of vulva: Secondary | ICD-10-CM

## 2019-06-23 DIAGNOSIS — N898 Other specified noninflammatory disorders of vagina: Secondary | ICD-10-CM | POA: Diagnosis not present

## 2019-06-23 LAB — WET PREP FOR TRICH, YEAST, CLUE

## 2019-06-23 NOTE — Telephone Encounter (Signed)
Telephone call to patient for office visit

## 2019-06-23 NOTE — Progress Notes (Signed)
23 year old  SWF G0 presents with left labial painful lump x1 day making sitting and walking difficult.  Questions if it is a Bartholin cyst.  New partner with condom 2 days earlier.  Rare bleeding Nexplanon placed 09/2017.  States also having some vaginal discharge without itching or odor.  Denies urinary symptoms, abdominal pain, nausea or fever.  Preschool teacher.  No known medical problems, history of migraines none recent.  Exam: Appears well.  External genitalia on left labia minora 4 cm translucent swelling extends into vagina minimally.  Speculum exam minimal discharge, wet prep negative, GC/chlamydia culture taken.  Area cleansed with Betadine, Xylocaine 1% injected, #11 blade drained large amount of gelatinous serosanguineous material with minimal pressure, no odor noted, good relief of discomfort.  Area flat after.    Left labial abscess  Plan: GC/Chlamydia culture pending, declines need for HIV, RPR today.  Keep area clean and dry, loose clothing, strongly encouraged condoms always.  Instructed to call if further problems.

## 2019-06-24 ENCOUNTER — Encounter: Payer: Self-pay | Admitting: Women's Health

## 2019-06-24 LAB — C. TRACHOMATIS/N. GONORRHOEAE RNA
C. trachomatis RNA, TMA: NOT DETECTED
N. gonorrhoeae RNA, TMA: NOT DETECTED

## 2019-07-09 ENCOUNTER — Encounter: Payer: Self-pay | Admitting: Women's Health

## 2019-07-09 NOTE — Telephone Encounter (Signed)
MyChart email returned unread. I called her and per DPR access note on file I left detailed message in voice mail and read her the email.

## 2019-07-11 ENCOUNTER — Telehealth: Payer: Self-pay | Admitting: *Deleted

## 2019-07-11 NOTE — Telephone Encounter (Signed)
Ok, she started I think working for [employer], (had been a Print production planner) please put in letter to ask for consideration for an alternative job that does not involve fork lifts due to anxiety. review they may not accommodate.
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
## 2019-07-12 ENCOUNTER — Encounter: Payer: Self-pay | Admitting: Women's Health

## 2019-07-14 ENCOUNTER — Ambulatory Visit: Payer: BLUE CROSS/BLUE SHIELD | Admitting: Gynecology

## 2019-07-14 ENCOUNTER — Other Ambulatory Visit: Payer: Self-pay

## 2019-07-14 ENCOUNTER — Encounter: Payer: Self-pay | Admitting: *Deleted

## 2019-07-14 ENCOUNTER — Encounter: Payer: Self-pay | Admitting: Gynecology

## 2019-07-14 VITALS — BP 116/74

## 2019-07-14 DIAGNOSIS — N750 Cyst of Bartholin's gland: Secondary | ICD-10-CM | POA: Diagnosis not present

## 2019-07-14 NOTE — Telephone Encounter (Signed)
Letter printed and left at appointment desk for patient to pick up.

## 2019-07-14 NOTE — Progress Notes (Signed)
    Anita [name] 14-Dec-1995 [id number]        23 y.o.  G0P0000 presents complaining of a painful lump on the vulva.  She saw Izora Gala a 09/02/2019.  Had this area lanced with a mucoid return.  Since then has reaccumulated some of the cyst.  Had intercourse over the weekend which was uncomfortable for a day or so afterwards although she notes now that her discomfort is resolving.  No fever chills drainage discharge.  GC/chlamydia screen done per Izora Gala was negative  Past medical history,surgical history, problem list, medications, allergies, family history and social history were all reviewed and documented in the EPIC chart.  Directed ROS with pertinent positives and negatives documented in the history of present illness/assessment and plan.  Exam: Caryn Bee assistant Vitals:   07/14/19 1458  BP: 116/74   General appearance:  Normal Abdomen soft nontender without masses guarding rebound Pelvic external BUS vagina with translucent cyst like area left lower introital region consistent with 2 cm Bartholin type cyst.  Noninflammatory mildly tender.  No surrounding erythema or drainage.  Vagina normal.  Cervix normal.  Uterus normal size midline mobile nontender.  Adnexa without masses or tenderness.  Assessment/Plan:  23 y.o. G0P0000 with small Bartholin type cyst left introital opening.  Discussed options at this point.  Translucent and fragile in appearance.  Discussed with patient that I suspect that this will spontaneously drain possibly with next episode of intercourse.  We discussed other possibilities to include re-incision and drainage with the risk of reaccumulation up to and including marsupialization in the surgical center.  Recommend observation now and follow-up if it persists to be an issue for marsupialization.  Patient agrees with the plan.    Anastasio Auerbach MD, 3:13 PM 07/14/2019

## 2019-07-14 NOTE — Patient Instructions (Signed)
Follow-up if the cyst continues to be an issue.

## 2019-07-29 ENCOUNTER — Other Ambulatory Visit: Payer: Self-pay

## 2019-07-29 ENCOUNTER — Telehealth: Payer: Self-pay

## 2019-07-29 ENCOUNTER — Ambulatory Visit: Payer: Self-pay | Admitting: Family Medicine

## 2019-07-29 NOTE — Progress Notes (Signed)
Encounter was canceled by patient

## 2019-07-29 NOTE — Telephone Encounter (Signed)
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  She said she has a PCP but has issues with her insurance right now and since she has seen you more recently wondered if you could fill it out for her?

## 2019-07-29 NOTE — Telephone Encounter (Signed)
Yes, have her fax to [place] or bring in and I will fill out.

## 2019-07-30 NOTE — Telephone Encounter (Signed)
Patient is going to e-mail it to me and I will print it and put on [place] desk. She asks we e-mail it back to her.

## 2019-08-04 ENCOUNTER — Encounter: Payer: Self-pay | Admitting: Gynecology

## 2019-08-11 ENCOUNTER — Encounter: Payer: Self-pay | Admitting: Women's Health

## 2019-08-11 ENCOUNTER — Encounter: Payer: Self-pay | Admitting: Gynecology

## 2019-08-11 NOTE — Telephone Encounter (Signed)
She sent a similar e-mail to Dr. Phineas Real.

## 2019-08-11 NOTE — Telephone Encounter (Signed)
Recommend office visit for reexamination and discussion of alternatives.

## 2019-08-12 ENCOUNTER — Encounter: Payer: Self-pay | Admitting: Gynecology

## 2019-08-12 ENCOUNTER — Other Ambulatory Visit: Payer: Self-pay

## 2019-08-12 ENCOUNTER — Ambulatory Visit (INDEPENDENT_AMBULATORY_CARE_PROVIDER_SITE_OTHER): Payer: BLUE CROSS/BLUE SHIELD | Admitting: Gynecology

## 2019-08-12 VITALS — BP 118/76

## 2019-08-12 DIAGNOSIS — N750 Cyst of Bartholin's gland: Secondary | ICD-10-CM | POA: Diagnosis not present

## 2019-08-12 NOTE — Telephone Encounter (Signed)
Dr. Phineas Real recommended she schedule a visit to come in and discuss.

## 2019-08-12 NOTE — Telephone Encounter (Signed)
I will have to submit the claim.

## 2019-08-12 NOTE — Patient Instructions (Signed)
Follow-up if any issues with the area that was lanced.

## 2019-08-12 NOTE — Progress Notes (Signed)
    Anita [name] 1996/06/17 [id number]        23 y.o.  G0P0000 presents with recurrence of her left Bartholin cyst.  Had it lanced by Izora Gala a 09/02/2019.  It quickly recurred.  Was evaluated 07/14/2019.  Options reviewed to include observation, Lance/Ward catheter and marsupialization.  The patient monitored since and has had intermittent swelling and discomfort in the cyst area.  Past medical history,surgical history, problem list, medications, allergies, family history and social history were all reviewed and documented in the EPIC chart.  Directed ROS with pertinent positives and negatives documented in the history of present illness/assessment and plan.  Exam: Caryn Bee assistant Vitals:   08/12/19 1059  BP: 118/76   General appearance:  Normal Abdomen soft nontender without masses guarding rebound Pelvic external BUS vagina with 3 cm left Bartholin cyst.  Noninflammatory.  Vagina otherwise normal.  Cervix normal.  Uterus normal size midline mobile nontender.  Adnexa without masses or tenderness.  Procedure: The skin overlying the cyst on the inner labia minora side at the level of the introitus was cleansed with Betadine and infiltrated with 1% lidocaine.  A stab incision was made with clear to yellowish mucoid material extruding.  No pus or other inflammatory debris noted.  The cyst completely resolved.  Pressure applied afterwards for hemostasis.  The patient tolerated well.  Assessment/Plan:  23 y.o. G0P0000 with left Bartholin cyst.  Was lanced once previously.  Discussed options for management to include observation, lancing, Ward catheter, marsupialization.  The pros and cons of each choice discussed.  At this point the patient would like it lanced.  She understands the recurrence risk ultimately requiring future treatment such as marsupialization.  Procedure was performed as above.     Anastasio Auerbach MD, 11:17 AM 08/12/2019

## 2019-08-19 NOTE — Telephone Encounter (Signed)
Warm compresses are really the only thing that may help to get it to drain otherwise marsupialization would be the next step.

## 2019-09-18 ENCOUNTER — Other Ambulatory Visit: Payer: Self-pay

## 2019-09-22 ENCOUNTER — Ambulatory Visit: Payer: BLUE CROSS/BLUE SHIELD | Admitting: Women's Health

## 2019-09-22 ENCOUNTER — Encounter: Payer: Self-pay | Admitting: Women's Health

## 2019-09-22 ENCOUNTER — Other Ambulatory Visit: Payer: Self-pay

## 2019-09-22 VITALS — BP 126/80

## 2019-09-22 DIAGNOSIS — N750 Cyst of Bartholin's gland: Secondary | ICD-10-CM

## 2019-09-22 DIAGNOSIS — N898 Other specified noninflammatory disorders of vagina: Secondary | ICD-10-CM

## 2019-09-22 LAB — WET PREP FOR TRICH, YEAST, CLUE

## 2019-09-22 NOTE — Patient Instructions (Signed)
Bartholin's Cyst  A Bartholin's cyst is a fluid-filled sac that forms on a Bartholin's gland. Bartholin's glands are small glands in the folds of skin around the vaginal opening (labia). These glands produce a fluid to moisten (lubricate) the outside of the vagina during sex. A cyst that is not large or infected may not cause any problems or require treatment. If the cyst gets infected, it is called a Bartholin's abscess. An abscess may cause symptoms such as pain and swelling and is more likely to require treatment. What are the causes? This condition may be caused by a blocked Bartholin's gland. These glands can become blocked due to natural buildup of fluid and oils. Bacteria inside of the cyst can cause infection. In many cases, the cause is not known. What increases the risk? You may be at increased risk of developing a Bartholin's cyst or abscess if:  You are of childbearing age.  You have a history of Bartholin's cysts or abscesses.  You have diabetes.  You have an STI (sexually transmitted infection). What are the signs or symptoms? Symptoms may include:  A bulge or lump on the labia, near the lower opening of the vagina.  Discomfort or pain. This may get worse during sex or when walking.  Redness, swelling, or fluid draining from the area. These may be signs of an abscess. How severe your symptoms are depends on the size of your cyst and whether it is infected. Infection causes symptoms to get more severe. How is this diagnosed? This condition may be diagnosed based on:  Your symptoms and medical history.  A physical exam to check for swelling in your vaginal area. You may lie on your back on an exam table and have your feet placed into footrests for the exam.  Blood tests to check for infections.  Removal of a fluid sample from the cyst or abscess (biopsy) for testing. You may work with a health care provider who specializes in [employer] health (gynecologist) for diagnosis  and treatment. How is this treated? If your cyst is small, not infected, and not causing symptoms, you may not need any treatment. These cysts often go away on their own, with home care such as hot baths or warm compresses. If you have a large cyst or an abscess, treatment may include:  Antibiotic medicine.  A procedure to drain the fluid inside the cyst or abscess. These procedures involve making an incision in the cyst or abscess so that the fluid drains out, and then one of the following may be done: ? A small, thin tube (catheter) may be placed inside the cyst or abscess so that it does not close and fill up with fluid again (fistulization). The catheter will be removed at a follow-up visit. ? The edges of the incision may be stitched to your skin so that the cyst or abscess stays open (marsupialization). This allows it to continue to drain and not fill up with fluid again. If you have cysts or abscesses that keep returning (recurring) and have required incision and drainage multiple times, your health care provider may talk with you about surgery to remove the Bartholin's gland. Follow these instructions at home: Medicines  Take over-the-counter and prescription medicines only as told by your health care provider.  If you were prescribed an antibiotic medicine, take it as told by your health care provider. Do not stop taking the antibiotic even if your condition improves. Managing pain and swelling  Try sitz baths to help with  pain and swelling. A sitz bath is a warm water bath in which the water only comes up to your hips and should cover your buttocks. You may take sitz baths several times a day.  Apply heat to the affected area as often as needed. Use the heat source that your health care provider recommends, such as a moist heat pack or a heating pad. ? Place a towel between your skin and the heat source. ? Leave the heat on for 20-30 minutes. ? Remove the heat if your skin turns  bright red. This is especially important if you are unable to feel pain, heat, or cold. You may have a greater risk of getting burned. General instructions  If your cyst or abscess was drained, follow instructions from your health care provider about how to take care of your wound. Use feminine pads as needed to absorb any drainage.  Do not push on or squeeze your cyst.  Do not have sex until the cyst has gone away or your wound from drainage has healed.  Take these steps to help prevent a Bartholin's cyst from returning, and to prevent other Bartholin's cysts from developing: ? Take a bath or shower once a day. Clean your vaginal area with mild soap and water when you bathe. ? Practice safe sex to prevent STIs. Talk with your health care provider about how to prevent STIs and which forms of birth control (contraception) may be best for you.  Keep all follow-up visits as told by your health care provider. This is important. Contact a health care provider if:  You have a fever.  You develop redness, swelling, or pain around your cyst.  You have fluid, blood, pus, or a bad smell coming from your cyst.  You have a cyst that gets larger or comes back. Summary  A Bartholin's cyst is a fluid-filled sac that forms on a Bartholin's gland. These glands are in the folds of skin around the vaginal opening (labia).  If your cyst is small, not infected, and not causing symptoms, you may not need any treatment. These cysts often go away on their own, with home care such as hot baths or warm compresses.  If you have a large cyst or an abscess, your health care provider may perform a procedure to drain the fluid.  If you have cysts or abscesses that keep returning (recurring) and have required incision and drainage multiple times, your health care provider may talk with you about surgery to remove the Bartholin's gland. This information is not intended to replace advice given to you by your health  care provider. Make sure you discuss any questions you have with your health care provider. Document Released: 10/30/2005 Document Revised: 08/22/2018 Document Reviewed: 08/01/2017 Elsevier Patient Education  2020 Elsevier Inc.  

## 2019-09-22 NOTE — Progress Notes (Signed)
23 year old S WF G0 presents with complaint of recurrence of left labial Bartholin cyst.  States it swells after intercourse, uncomfortable.  Bartholin cyst lanced in August and again in September, draining mucoid drainage.  Questions what she can do to help prevent it from returning.  Has had negative GC/chlamydia.  States having scant white discharge, denies urinary symptoms, abdominal/back pain or fever.  Rare cycles with Nexplanon placed 09/2017.  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  No known medical problems.  Exam: Appears well.  External genitalia on left labia 3 to 4 cm translucent swelling extends into the vagina, soft minimal tenderness.  Speculum exam scant white discharge wet prep negative. Left labia cleansed with Betadine, infiltrated with 1% lidocaine stab incision was made clear to bloody drainage with no purulent exudate.  No odor.  Cyst completely resolved.  Tolerated well.  Recurrent left Bartholin cyst  Plan: Options discussed concerning marsupialization.  States we are out of network plans to [employer] and will then schedule.  Meanwhile warm soaks, loose clothing, open to air as able.

## 2019-10-29 ENCOUNTER — Emergency Department (HOSPITAL_COMMUNITY)
Admission: EM | Admit: 2019-10-29 | Discharge: 2019-10-29 | Disposition: A | Discharge status: Home / Self Care | Payer: BLUE CROSS/BLUE SHIELD | Attending: Emergency Medicine | Admitting: Emergency Medicine

## 2019-10-29 ENCOUNTER — Encounter (HOSPITAL_COMMUNITY): Payer: Self-pay | Admitting: Emergency Medicine

## 2019-10-29 ENCOUNTER — Other Ambulatory Visit: Payer: Self-pay

## 2019-10-29 DIAGNOSIS — F411 Generalized anxiety disorder: Secondary | ICD-10-CM | POA: Insufficient documentation

## 2019-10-29 DIAGNOSIS — Z8659 Personal history of other mental and behavioral disorders: Secondary | ICD-10-CM | POA: Diagnosis not present

## 2019-10-29 DIAGNOSIS — F4321 Adjustment disorder with depressed mood: Secondary | ICD-10-CM | POA: Insufficient documentation

## 2019-10-29 DIAGNOSIS — F410 Panic disorder [episodic paroxysmal anxiety] without agoraphobia: Secondary | ICD-10-CM | POA: Diagnosis present

## 2019-10-29 DIAGNOSIS — F4323 Adjustment disorder with mixed anxiety and depressed mood: Secondary | ICD-10-CM | POA: Insufficient documentation
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
## 2019-11-05 ENCOUNTER — Ambulatory Visit: Payer: BLUE CROSS/BLUE SHIELD

## 2019-11-10 ENCOUNTER — Telehealth: Payer: Self-pay

## 2019-11-10 NOTE — Telephone Encounter (Signed)
Patient called stating she is interested in scheduling marsupialization of bartholin.  (Office note stated insurance to change. Was out of network at visit.)

## 2019-11-11 ENCOUNTER — Other Ambulatory Visit: Payer: Self-pay

## 2019-11-11 NOTE — Telephone Encounter (Signed)
I called patient back. I obtained her new insurance info and will check her benefits and call her back to discuss estimated surgery prepayment amount due by week before surgery.  I recommended she go ahead and schedule appointment with Dr. Delilah Shan to let him examine the area and send me a surgery order. That is scheduled 12/15/2019.  Meantime as soon as I get his Feb schedule I will call patient and we will hold OR time for her until I get his order to schedule.

## 2019-11-12 ENCOUNTER — Ambulatory Visit: Payer: BLUE CROSS/BLUE SHIELD | Admitting: Women's Health

## 2019-11-12 ENCOUNTER — Encounter: Payer: Self-pay | Admitting: Women's Health

## 2019-11-12 VITALS — BP 124/76

## 2019-11-12 DIAGNOSIS — N750 Cyst of Bartholin's gland: Secondary | ICD-10-CM
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
## 2019-11-12 NOTE — Patient Instructions (Signed)
Bartholin's Cyst  A Bartholin's cyst is a fluid-filled sac that forms on a Bartholin's gland. Bartholin's glands are small glands in the folds of skin around the vaginal opening (labia). These glands produce a fluid to moisten (lubricate) the outside of the vagina during sex. A cyst that is not large or infected may not cause any problems or require treatment. If the cyst gets infected, it is called a Bartholin's abscess. An abscess may cause symptoms such as pain and swelling and is more likely to require treatment. What are the causes? This condition may be caused by a blocked Bartholin's gland. These glands can become blocked due to natural buildup of fluid and oils. Bacteria inside of the cyst can cause infection. In many cases, the cause is not known. What increases the risk? You may be at increased risk of developing a Bartholin's cyst or abscess if:  You are of childbearing age.  You have a history of Bartholin's cysts or abscesses.  You have diabetes.  You have an STI (sexually transmitted infection). What are the signs or symptoms? Symptoms may include:  A bulge or lump on the labia, near the lower opening of the vagina.  Discomfort or pain. This may get worse during sex or when walking.  Redness, swelling, or fluid draining from the area. These may be signs of an abscess. How severe your symptoms are depends on the size of your cyst and whether it is infected. Infection causes symptoms to get more severe. How is this diagnosed? This condition may be diagnosed based on:  Your symptoms and medical history.  A physical exam to check for swelling in your vaginal area. You may lie on your back on an exam table and have your feet placed into footrests for the exam.  Blood tests to check for infections.  Removal of a fluid sample from the cyst or abscess (biopsy) for testing. You may work with a health care provider who specializes in [employer] health (gynecologist) for diagnosis  and treatment. How is this treated? If your cyst is small, not infected, and not causing symptoms, you may not need any treatment. These cysts often go away on their own, with home care such as hot baths or warm compresses. If you have a large cyst or an abscess, treatment may include:  Antibiotic medicine.  A procedure to drain the fluid inside the cyst or abscess. These procedures involve making an incision in the cyst or abscess so that the fluid drains out, and then one of the following may be done: ? A small, thin tube (catheter) may be placed inside the cyst or abscess so that it does not close and fill up with fluid again (fistulization). The catheter will be removed at a follow-up visit. ? The edges of the incision may be stitched to your skin so that the cyst or abscess stays open (marsupialization). This allows it to continue to drain and not fill up with fluid again. If you have cysts or abscesses that keep returning (recurring) and have required incision and drainage multiple times, your health care provider may talk with you about surgery to remove the Bartholin's gland. Follow these instructions at home: Medicines  Take over-the-counter and prescription medicines only as told by your health care provider.  If you were prescribed an antibiotic medicine, take it as told by your health care provider. Do not stop taking the antibiotic even if your condition improves. Managing pain and swelling  Try sitz baths to help with  pain and swelling. A sitz bath is a warm water bath in which the water only comes up to your hips and should cover your buttocks. You may take sitz baths several times a day.  Apply heat to the affected area as often as needed. Use the heat source that your health care provider recommends, such as a moist heat pack or a heating pad. ? Place a towel between your skin and the heat source. ? Leave the heat on for 20-30 minutes. ? Remove the heat if your skin turns  bright red. This is especially important if you are unable to feel pain, heat, or cold. You may have a greater risk of getting burned. General instructions  If your cyst or abscess was drained, follow instructions from your health care provider about how to take care of your wound. Use feminine pads as needed to absorb any drainage.  Do not push on or squeeze your cyst.  Do not have sex until the cyst has gone away or your wound from drainage has healed.  Take these steps to help prevent a Bartholin's cyst from returning, and to prevent other Bartholin's cysts from developing: ? Take a bath or shower once a day. Clean your vaginal area with mild soap and water when you bathe. ? Practice safe sex to prevent STIs. Talk with your health care provider about how to prevent STIs and which forms of birth control (contraception) may be best for you.  Keep all follow-up visits as told by your health care provider. This is important. Contact a health care provider if:  You have a fever.  You develop redness, swelling, or pain around your cyst.  You have fluid, blood, pus, or a bad smell coming from your cyst.  You have a cyst that gets larger or comes back. Summary  A Bartholin's cyst is a fluid-filled sac that forms on a Bartholin's gland. These glands are in the folds of skin around the vaginal opening (labia).  If your cyst is small, not infected, and not causing symptoms, you may not need any treatment. These cysts often go away on their own, with home care such as hot baths or warm compresses.  If you have a large cyst or an abscess, your health care provider may perform a procedure to drain the fluid.  If you have cysts or abscesses that keep returning (recurring) and have required incision and drainage multiple times, your health care provider may talk with you about surgery to remove the Bartholin's gland. This information is not intended to replace advice given to you by your health  care provider. Make sure you discuss any questions you have with your health care provider. Document Released: 10/30/2005 Document Revised: 08/22/2018 Document Reviewed: 08/01/2017 Elsevier Patient Education  2020 [employer].

## 2019-11-18 ENCOUNTER — Telehealth: Payer: Self-pay | Admitting: Psychology

## 2019-11-18 NOTE — Telephone Encounter (Signed)
Anita [name] talked to Dr. Hartford Poli about referral due to pt leaving message on [name] phone.  Called pt and pt agreed to therapy appt 1/6 at 230pm

## 2019-11-19 ENCOUNTER — Other Ambulatory Visit: Payer: Self-pay

## 2019-11-19 ENCOUNTER — Telehealth (INDEPENDENT_AMBULATORY_CARE_PROVIDER_SITE_OTHER): Payer: 59 | Admitting: Psychology

## 2019-11-19 DIAGNOSIS — F4323 Adjustment disorder with mixed anxiety and depressed mood: Secondary | ICD-10-CM

## 2019-11-19 NOTE — Progress Notes (Signed)
Integrated Behavioral Health Visit via Telemedicine (Telephone)  11/19/2019 Anita [name] [id number]   Session Start time: 30  Session End time: 3 Total time: 30  Referring Provider: Casimer Lanius  Type of Visit: Telephonic Patient location: home  [facility] Provider location: [facility] All persons participating in visit: pt and provider   Discussed confidentiality: Yes    The following statements were read to the patient and/or legal guardian that are established with the [facility] Provider.  "The purpose of this phone visit is to provide behavioral health care while limiting exposure to the coronavirus (COVID19).  There is a possibility of technology failure and discussed alternative modes of communication if that failure occurs."  "By engaging in this telephone visit, you consent to the provision of healthcare.  Additionally, you authorize for your insurance to be billed for the services provided during this telephone visit."   Patient and/or legal guardian consented to telephone visit: Yes   PRESENTING CONCERNS: Patient and/or family reports the following symptoms/concerns: Pt reports a recent loss of a close friend.  Pt reported that she has always had underlying anxiety but this has exacerbated it for her.  Pt reported that she has use CBT coping strategies before such as meditation.   Pt reported passive SI thoughts when she first lost her friend but denied current SI thoughts.  Pt denied plan and intent.  Pt was given crisis number.   Pt and Dr. Hartford Poli discussed engaging in coping strategies but also being to process loss.  Dr. Hartford Poli normalized the grief process.  Duration of problem: past month; Severity of problem: moderate  STRENGTHS (Protective Factors/Coping Skills): Insightful of syx, support system    GOALS ADDRESSED: Patient will: 1.  Reduce symptoms of: anxiety and grief (panic attacks and issues with sleep, feelings of sadness) 2.  Increase knowledge and/or ability  of: coping skills  3.  Demonstrate ability to: Increase healthy adjustment to current life circumstances  INTERVENTIONS: Interventions utilized:  Supportive Counseling and CBT Standardized Assessments completed: PHQ 9 and GAD7  ASSESSMENT: Patient currently experiencing exacerbated anxiety related to recent loss of friend.   Patient may benefit from continued supportive counseling to process grief and externalize anxiety.   PLAN: 1. Follow up with behavioral health clinician on : engaging in mindfulness activities and begin to normalize grief process  2. Behavioral recommendations: continued supportive therapy to process grief  3. Referral(s): [facility] (In Clinic)  Erlinda Hong, PhD., LMFT-A

## 2019-11-20 ENCOUNTER — Ambulatory Visit: Payer: 59 | Attending: Internal Medicine

## 2019-11-20 DIAGNOSIS — U071 COVID-19: Secondary | ICD-10-CM | POA: Insufficient documentation

## 2019-11-20 DIAGNOSIS — Z20822 Contact with and (suspected) exposure to covid-19: Secondary | ICD-10-CM

## 2019-11-22 LAB — NOVEL CORONAVIRUS, NAA: SARS-CoV-2, NAA: DETECTED — AB

## 2019-12-02 ENCOUNTER — Other Ambulatory Visit: Payer: Self-pay

## 2019-12-02 ENCOUNTER — Telehealth (INDEPENDENT_AMBULATORY_CARE_PROVIDER_SITE_OTHER): Payer: 59 | Admitting: Psychology

## 2019-12-02 DIAGNOSIS — F4323 Adjustment disorder with mixed anxiety and depressed mood: Secondary | ICD-10-CM

## 2019-12-02 NOTE — Progress Notes (Signed)
Integrated Behavioral Health Visit via Telemedicine (Telephone)  12/02/2019 EMYLIA PIQUETTE [id number]   Session Start time: 34  Session End time: 3 Total time: 30  Referring Provider: Casimer Lanius  Type of Visit: Telephonic Patient location: home [facility] Provider location: [facility] All persons participating in visit: pt, provider and Dr. Posey Pronto   Discussed confidentiality: Yes    The following statements were read to the patient and/or legal guardian that are established with the [facility] Provider.  "The purpose of this phone visit is to provide behavioral health care while limiting exposure to the coronavirus (COVID19).  There is a possibility of technology failure and discussed alternative modes of communication if that failure occurs."  "By engaging in this telephone visit, you consent to the provision of healthcare.  Additionally, you authorize for your insurance to be billed for the services provided during this telephone visit."  Pt was verbally asked if Dr. Posey Pronto could shadow appt and pt consented.  Patient and/or legal guardian consented to telephone visit: Yes   PRESENTING CONCERNS: Patient and/or family reports the following symptoms/concerns: Pt reported that she has been experiencing more syx of depression. Pt reported feeling sad and overwhelmed.  Pt reported she had thoughts of SI last week.  Pt reported she thought about overdosing with tylenol or pain killers.  Pt denied intent because she is fearful. Pt stated her friend was able to help her get in a better mind space during that time. Pt reported she currently has COVID and is living with her roommate and friend who also has [place].  Pt was encouraged to talk to her mom about her SI.  Pt was given crisis hotline last appt and was reminded.  Pt was also directed to go to [facility] if SI worsens.    Pt and Dr. Hartford Poli discussed other coping strategies such as grounding activities where the pt goes through the [place] and  comes up with 2 words with each letter of alphabet.  Pt was also encouraged to listen to music or watch TV to calm mind when having SI.   Pt expressed interest in starting mood medication and Dr. Hartford Poli stated she would get Dr. Erin Hearing to call her for conversation about medication prescription. Dr. Erin Hearing was consulted day of this appt.  Duration of problem: past month; Severity of problem: severe  STRENGTHS (Protective Factors/Coping Skills): Family support, insight into syx  GOALS ADDRESSED: Patient will: 1.  Reduce symptoms of: anxiety and grief; expression thoughts of SI (aim to decrease to none from once a week) 2.  Increase knowledge and/or ability of: coping skills : engage in safety planning and seek info about medication  3.  Demonstrate ability to: Increase healthy adjustment to current life circumstances  INTERVENTIONS: Interventions utilized:  Supportive Counseling and CBT Standardized Assessments completed: CSSRS  ASSESSMENT: Patient currently experiencing (adjustment disorder) anxiety exacerbated by grief causing depressive syx.   Patient may benefit from supportive therapy to process grief as well as safety planning for SI.   PLAN: 1. Follow up with behavioral health clinician on : safety plan for SI; schedule chek-in call with Dr. Hartford Poli for 2/22 at 1pm 2. Behavioral recommendations: pt recommended to engage in safety plan and medication management with Dr. Erin Hearing 3. Referral(s): [facility] (In Clinic)  Erlinda Hong, PhD., LMFT-A

## 2019-12-03 ENCOUNTER — Ambulatory Visit: Payer: 59 | Attending: Internal Medicine

## 2019-12-03 ENCOUNTER — Telehealth: Payer: Self-pay | Admitting: Family Medicine

## 2019-12-03 DIAGNOSIS — Z20822 Contact with and (suspected) exposure to covid-19: Secondary | ICD-10-CM

## 2019-12-03 MED ORDER — SERTRALINE HCL 50 MG PO TABS: 50.0000 mg | ORAL_TABLET | Freq: Every day | ORAL | 1 refills | Status: DC

## 2019-12-03 NOTE — Telephone Encounter (Signed)
Please make her a video visit to see me next Tuesday AM  Thanks  Anita [name]

## 2019-12-03 NOTE — Telephone Encounter (Signed)
Spoke with her yesterday evening Relates is feeling down with difficulty sleeping and concentrating.  Earlier thoughts of life not worth living but none presently and no thoughts of active suicidal ideation Agrees to try a medication Is concerned about the addicting possibilities of medications and possible sex side effects  Never taken mood medications in past No allergies We discussed onset of SSRI and possible side effects especailly the possibly potentiating properties and if any worsening or thoughts of harm should contact us immediately.  She agrees  Will start low dose zoloft and increase Asked her to make an appointment via video for next week Dr Hartford Poli will contact on Friday

## 2019-12-03 NOTE — Telephone Encounter (Signed)
Attempted to call pt with appt no answer. Will try again later.Anita [name] Anita [name], CMA

## 2019-12-04 LAB — NOVEL CORONAVIRUS, NAA: SARS-CoV-2, NAA: NOT DETECTED

## 2019-12-04 NOTE — Telephone Encounter (Signed)
First available appointment was scheduled for 02/

## 2019-12-05 ENCOUNTER — Telehealth: Payer: Self-pay | Admitting: Psychology

## 2019-12-05 NOTE — Telephone Encounter (Signed)
Called pt for safety check-in.  Pt denied SI.  Pt reported her COVID test came back negative so she is happy to go see family again.  Pt reported she scheduled visit with Dr. Erin Hearing next week and with Dr. Hartford Poli 1/28 at 430 virtually

## 2019-12-09 ENCOUNTER — Telehealth (INDEPENDENT_AMBULATORY_CARE_PROVIDER_SITE_OTHER): Payer: 59 | Admitting: Family Medicine

## 2019-12-09 ENCOUNTER — Other Ambulatory Visit: Payer: Self-pay

## 2019-12-09 DIAGNOSIS — F329 Major depressive disorder, single episode, unspecified: Secondary | ICD-10-CM | POA: Diagnosis not present

## 2019-12-09 DIAGNOSIS — F32A Depression, unspecified: Secondary | ICD-10-CM

## 2019-12-09 NOTE — Progress Notes (Signed)
.  [facility] Telemedicine Visit  Patient consented to have virtual visit. Method of visit: Video  Encounter participants: Patient: Anita [name] - located at home Provider: Lind Covert - located at office Others (if applicable): Non  Chief Complaint: Follow up depression  HPI:  Overall she feels she is better. Less down and less fatigue and slighlty better sleep.  Is not exercising but feels she could start.  Taking sertraline daily for last two days.  Thinks may be giving her a headache?   Having mild intermittent shortness of breath and chest pain that lasts for minutes without exertions.   No suicidal ideation no leg swelling or pain   ROS: per HPI  Exam:  Respiratory: normal Psych:  Cognition and judgment appear intact. Alert, communicative  and cooperative with normal attention span and concentration. No apparent delusions, illusions, hallucinations   Assessment/Plan:  Depression Some improvement.   Suggested trying regular exercise.  Recommend continue sertraline for a few more days to see if headaches continue if so then stop and see if improve.  Let me know how this is going.  Has follow up with Dr Hartford Poli   Intermittent chest pain and shortness of breath - probably residual Covid infection.  Very unlikely DVT but if recurs and persists or develops leg swelling should contact us immediately or go to ER  Time spent during visit with patient: 16 minutes

## 2019-12-10 ENCOUNTER — Other Ambulatory Visit: Payer: Self-pay

## 2019-12-10 NOTE — Assessment & Plan Note (Signed)
Some improvement.   Suggested trying regular exercise.  Recommend continue sertraline for a few more days to see if headaches continue if so then stop and see if improve.  Let me know how this is going.  Has follow up with Dr Hartford Poli

## 2019-12-11 ENCOUNTER — Ambulatory Visit (INDEPENDENT_AMBULATORY_CARE_PROVIDER_SITE_OTHER): Payer: 59 | Admitting: Psychology

## 2019-12-11 ENCOUNTER — Encounter: Payer: Self-pay | Admitting: Obstetrics and Gynecology

## 2019-12-11 ENCOUNTER — Ambulatory Visit (INDEPENDENT_AMBULATORY_CARE_PROVIDER_SITE_OTHER): Payer: 59 | Admitting: Obstetrics and Gynecology

## 2019-12-11 DIAGNOSIS — Z01818 Encounter for other preprocedural examination: Secondary | ICD-10-CM | POA: Diagnosis not present

## 2019-12-11 DIAGNOSIS — N750 Cyst of Bartholin's gland: Secondary | ICD-10-CM | POA: Diagnosis not present

## 2019-12-11 DIAGNOSIS — F4323 Adjustment disorder with mixed anxiety and depressed mood: Secondary | ICD-10-CM

## 2019-12-11 DIAGNOSIS — N907 Vulvar cyst: Secondary | ICD-10-CM

## 2019-12-11 NOTE — Progress Notes (Signed)
Anita [name]  03-07-1996 [id number]  HPI The patient is a 24 y.o. [id number] who presents today for recurrence of a vulvar cyst.  She states she has had to have it lanced about 4-5 times. A Word catheter has not been attempted yet.  She has a lot of pain over the area of because it quickly accumulates.  She says it drains a mucus clear substance when it is lanced.  Due to the recurrence she had been scheduled for marsupialization of the gland (prior to my arrival to this practice) which is coming up in February.  She is here for a preoperative meeting and to have the cyst lanced again today.  Per review of the records, she had a negative STD screen with her partner.  She does have Nexplanon in place with an occasional period.   Past medical history,surgical history, problem list, medications, allergies, family history and social history were all reviewed and documented as reviewed in the EPIC chart.  ROS:  GYN ROS: normal menses, no abnormal bleeding, pelvic pain or discharge.  Physical Exam BP 118/76   LMP 11/20/2019   General: Pleasant young female, no acute distress, alert and oriented  PELVIC EXAM: VULVA: Posterior left labia majora contains a 5 cm semitranslucent left subcutaneous cyst with mild tenderness.  It is not clear that this is atypical location for Bartholin gland it appears to be more of an epidermal/sebaceous cyst.  Procedure Incision and drainage of labial cyst The cyst is cleansed with Betadine, 2 mL of 1% lidocaine are injected, an 11 blade scalpel was used to make a small stab incision into the cyst, a large amount of mildly sanguinous clear mucinous glandular contents was drained, no odor or purulence.  The cyst was completely flattened at the region of the procedure and she tolerated it well.  Hemostasis was noted.  Caryn Bee was present during the exam and procedure  Assessment 1. 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 Incision  and drainage of cyst   Plan The patient did desire to have the cyst drained again today as it is becoming symptomatic and difficult to sit.  I did discuss options for management to include observation, incision and drainage, placement of a Word catheter, and marsupialization.  Due to the fact that Word catheters frequently fall out the patient did not want to proceed with this option.  I think this is reasonable as the skin over the cyst is quite thin and this is not a typical Bartholin gland cyst, so premature ejection of a Word catheter would be likely.  Since it is quite recurrent and has not responded permanently to I&D, I do think proceeding to the operating room for marsupialization of the cyst cavity would be reasonable.  As a temporizing measure, the cyst was incised and drained again today.  The patient tolerated this well.  There is no sign of infection of the cyst.  In regards to the proposed marsupialization procedure, we discussed some of the expectations postoperatively.  We discussed risks of the procedure to include infection, bleeding, need for blood transfusion, injury/disfigurement of the surrounding vulvar structures, loss of sensation or nerve pain in the area, and to expect at least a little bit of a skin divot as the area is healing, which may or may not be permanent.  We also discussed the possibility of a hematoma which can be resolved and fairly debilitating but temporary pain.  She understands that she will possibly  need to use narcotic pain relievers after this procedure and this may impact ability to work and drive while she is waiting to feel  well enough to sit up again.  She understands all of the concepts discussed and would like to proceed with marsupialization.  She is advised to return if there is any sign of purulence, spreading redness, or significant pain that is not resolving over the area where the I&D was performed today.  All questions were answered by the end of  the visit.    Larey Days MD 12/11/19

## 2019-12-11 NOTE — BH Specialist Note (Signed)
Integrated Behavioral Health Visit via Telemedicine (Telephone)  12/11/2019 Anita [name] [id number]   Session Start time: 50  Session End time: 5 Total time: 30  Referring Provider: Casimer Lanius  Type of Visit: Telephonic Patient location: home [facility] Provider location: [facility] All persons participating in visit: pt and provider   Discussed confidentiality: Yes    The following statements were read to the patient and/or legal guardian that are established with the [facility] Provider.  "The purpose of this phone visit is to provide behavioral health care while limiting exposure to the coronavirus (COVID19).  There is a possibility of technology failure and discussed alternative modes of communication if that failure occurs."  "By engaging in this telephone visit, you consent to the provision of healthcare.  Additionally, you authorize for your insurance to be billed for the services provided during this telephone visit."   Patient and/or legal guardian consented to telephone visit: Yes   PRESENTING CONCERNS: Patient and/or family reports the following symptoms/concerns: Pt reported her syx have improved of depression.  She reported having less sad times where she cries.  Pt reported that the times where she feels most vulnerable to sadness is when she is going to bed. Pt reported she has been trying meditation before bed.  Pt was recommened other meditation apps such as CALM and HeadSpace. Pt denied SI.  Duration of problem: past 2 months; Severity of problem: moderate  STRENGTHS (Protective Factors/Coping Skills): Insight into syx, family support   GOALS ADDRESSED: Patient will: 1.  Reduce symptoms of: anxiety and grief: Pt's thoughts of SI have stopped.  Pt have engaged in new coping strategies to help anxiety and sadness 2.  Increase knowledge and/or ability of: coping skills: Pt has engaged in safety planning for SI if needed, pt started medication, pt started meditation  every night before bed 3.  Demonstrate ability to: Increase healthy adjustment to current life circumstances  INTERVENTIONS: Interventions utilized:  Brief CBT and supportive counseling  Standardized Assessments completed: Not Needed  ASSESSMENT: Patient currently experiencing (adjustment disorder) anxiety exacerbated by grief causing depressive syx.   Patient may benefit from supportive and CBT therapy   PLAN: 1. Follow up with behavioral health clinician on : safety plan for SI; continue meditation and medication  2. Behavioral recommendations: pt recommended to engage in safety plan and medication management with Dr. Erin Hearing 3. Referral(s): [facility] (In Clinic)  Erlinda Hong, PhD., LMFT-A

## 2019-12-11 NOTE — Patient Instructions (Signed)
We will see you next month for your planned surgery. Please do not take any ibuprofen or aspirin for at least 7 days prior to the procedure. Remember absolutely no food or drink immediately prior to the procedure per the instructions that the surgery center will be giving you. It is okay to use ice packs on the area as needed for pain control. Please let us know if there is any spreading redness, white or puslike drainage, or severe pain that is not getting better.

## 2019-12-15 ENCOUNTER — Ambulatory Visit: Payer: BLUE CROSS/BLUE SHIELD | Admitting: Obstetrics and Gynecology

## 2019-12-17 ENCOUNTER — Encounter: Payer: Self-pay | Admitting: Gynecology

## 2019-12-22 ENCOUNTER — Telehealth: Payer: Self-pay

## 2019-12-22 NOTE — Telephone Encounter (Signed)
Patient called to ask if it will be okay if she calls in and makes her surgery prepayment on Friday 12/26/19. That is a little less that the week her financial letter advised. I checked with Sharrie Rothman and she said that will be fine. Patient informed.

## 2019-12-23 ENCOUNTER — Ambulatory Visit: Payer: 59 | Admitting: Psychology

## 2019-12-23 ENCOUNTER — Other Ambulatory Visit: Payer: Self-pay

## 2019-12-23 ENCOUNTER — Ambulatory Visit (HOSPITAL_COMMUNITY)
Admission: EM | Admit: 2019-12-23 | Discharge: 2019-12-23 | Disposition: A | Discharge status: Home / Self Care | Payer: 59 | Attending: Urgent Care | Admitting: Urgent Care

## 2019-12-23 ENCOUNTER — Telehealth: Payer: Self-pay

## 2019-12-23 ENCOUNTER — Encounter (HOSPITAL_COMMUNITY): Payer: Self-pay

## 2019-12-23 DIAGNOSIS — J019 Acute sinusitis, unspecified: Secondary | ICD-10-CM

## 2019-12-23 DIAGNOSIS — R0602 Shortness of breath: Secondary | ICD-10-CM

## 2019-12-23 DIAGNOSIS — Z8616 Personal history of COVID-19: Secondary | ICD-10-CM

## 2019-12-23 DIAGNOSIS — R0981 Nasal congestion: Secondary | ICD-10-CM

## 2019-12-23 DIAGNOSIS — R0982 Postnasal drip: Secondary | ICD-10-CM

## 2019-12-23 DIAGNOSIS — R0789 Other chest pain: Secondary | ICD-10-CM

## 2019-12-23 MED ORDER — AMOXICILLIN 875 MG PO TABS: 875.0000 mg | ORAL_TABLET | Freq: Two times a day (BID) | ORAL | 0 refills | Status: DC

## 2019-12-23 MED ORDER — CETIRIZINE HCL 10 MG PO TABS: 10.0000 mg | ORAL_TABLET | Freq: Every day | ORAL | 0 refills | Status: DC

## 2019-12-23 MED ORDER — PSEUDOEPHEDRINE HCL 60 MG PO TABS: 60.0000 mg | ORAL_TABLET | Freq: Three times a day (TID) | ORAL | 0 refills | Status: DC | PRN

## 2019-12-23 NOTE — Telephone Encounter (Signed)
Pt calls nurse line regarding productive cough and sore throat. Pt reports that she has been coughing up blood-tinged sputum for over a week and has been having intermittent episodes of shortness of breath and chest pain. Patient reports positive covid results on 11/20/19. At time of call, patient was not in distress and was not having any difficulty breathing. Advised patient it would be best for her to be evaluated in urgent care or emergency room. Patient agreeable with plan.   To PCP  Talbot Grumbling, RN

## 2019-12-23 NOTE — Telephone Encounter (Signed)
Anita [name] called to let me know patient had pos COVID test back on 11/20/19.  She will not need the pre op appt for Covid screen I had scheduled as protocol is not to test for 90 days after pos result.    Anita [name] noticed in chart a telephone call today on a nurse line and patient's sx were concerning especially with surgery looming.  Nurse had advised her to go to ER or Urgent Care.  Anita [name] said they will be watching her chart and keeping an eye on updates to decide if okay for surgery.

## 2019-12-23 NOTE — Telephone Encounter (Signed)
Pt seen in urgent care today for sore throat and blood tinged sputum. Per Urgent Care note, patient is to follow up with PCP regarding chest CT to rule out blood clot. Patient is not currently showing any signs of distress.   Consulted with preceptor (Dr. McDiarmid), advised that patient be brought in for office visit. Pt tested positive for COVID one month ago, then tested negative for COVID two weeks ago.   ED precautions given. Scheduled office visit tomorrow afternoon.   Talbot Grumbling, RN

## 2019-12-23 NOTE — Discharge Instructions (Addendum)
Contact your regular doctor to see if they can discuss the need for a chest CT scan to rule out a blood clot. Currently, your only risk factor is having had COVID 19 infection. Your vital signs are stable and you do not have the typical symptom set a person with a blood clot in their lungs would have. Therefore, in this case, it would be best to manage you as an outpatient with your PCP if they still want to rule out a chest clot.

## 2019-12-23 NOTE — ED Provider Notes (Signed)
[facility]   MRN: [id number] DOB: [birth year]  Subjective:   Anita [name] is a 24 y.o. female presenting for 1 week history of persistent throat pain, sinus congestion.  She is also had 1 month history of intermittent (not daily) chest pain and shortness of breath.  Patient tested positive for Covid with a month ago.  She [facility] and tested again for Covid which was negative.  She also tested negative for strep throat.  She contacted her PCP today and they stated they had concern for chest clot.  She presents today for concern regarding chest clots stating that she has been coughing up more recently from her throat discomfort and has noticed blood in her sputum.  Last episode of chest pain and shortness of breath was this past Saturday.  Patient states that she thinks it may have been a panic attack given that she has had a recent death in the family and has felt anxious about it.  Denies having symptoms since then.  No current facility-administered medications for this encounter.  Current Outpatient Medications:  .  etonogestrel (NEXPLANON) 68 MG IMPL implant, 1 each by Subdermal route once., Disp: , Rfl:  .  sertraline (ZOLOFT) 50 MG tablet, Take 1 tablet (50 mg total) by mouth daily., Disp: 30 tablet, Rfl: 1   No Known Allergies  Past Medical History:  Diagnosis Date  . Chlamydia infection   . Migraines      Past Surgical History:  Procedure Laterality Date  . APPENDECTOMY    . Nexplanon      Inserted 09-14-17    Family History  Problem Relation Age of Onset  . Migraines Maternal Grandfather   . Asthma Mother   . Healthy Father     Social History   Tobacco Use  . Smoking status: Never Smoker  . Smokeless tobacco: Never Used  Substance Use Topics  . Alcohol use: Yes    Comment: occasionally  . Drug use: No    ROS   Objective:   Vitals: BP 122/79 (BP Location: Left Arm)   Pulse 85   Temp 98.1 F (36.7 C)  (Oral)   Resp 16   SpO2 97%   Physical Exam Constitutional:      General: She is not in acute distress.    Appearance: Normal appearance. She is well-developed. She is obese. She is not ill-appearing, toxic-appearing or diaphoretic.  HENT:     Head: Normocephalic and atraumatic.     Nose: Nose normal.     Mouth/Throat:     Mouth: Mucous membranes are moist.     Pharynx: No oropharyngeal exudate or posterior oropharyngeal erythema.     Comments: Significant postnasal drainage overlying pharynx. Eyes:     Extraocular Movements: Extraocular movements intact.     Pupils: Pupils are equal, round, and reactive to light.  Cardiovascular:     Rate and Rhythm: Normal rate and regular rhythm.     Pulses: Normal pulses.     Heart sounds: Normal heart sounds. No murmur. No friction rub. No gallop.   Pulmonary:     Effort: Pulmonary effort is normal. No respiratory distress.     Breath sounds: Normal breath sounds. No stridor. No wheezing, rhonchi or rales.  Skin:    General: Skin is warm and dry.     Findings: No rash.  Neurological:     Mental Status: She is alert and oriented to person, place, and  time.  Psychiatric:        Mood and Affect: Mood normal.        Behavior: Behavior normal.        Thought Content: Thought content normal.        Judgment: Judgment normal.      Assessment and Plan :   1. Acute sinusitis, recurrence not specified, unspecified location   2. Sinus congestion   3. Post-nasal drainage   4. Atypical chest pain   5. Shortness of breath   6. History of COVID-19     Patient has minimal risk factors for pulmonary embolism.  She also does not have persistent shortness of breath or chest pain.  Vital signs are very stable, physical exam findings also reassuring.  Recommend that she continue follow-up with her PCP and pursue outpatient chest CT as I do not believe she is a good candidate for an emergency room evaluation.  Discussed this with patient at length and  she was agreeable to this.  Start amoxicillin to address sinusitis.  Hold off on Flonase and start Zyrtec and pseudoephedrine. Counseled patient on potential for adverse effects with medications prescribed/recommended today, ER and return-to-clinic precautions discussed, patient verbalized understanding.    Jaynee Eagles, PA-C 12/23/19 1420

## 2019-12-23 NOTE — ED Triage Notes (Signed)
Patient presents to Urgent Care with complaints of sore throat since a little over a week ago. Patient reports she went to a [facility] and had negative covid, negative strep; was given nasal spray that has not been helping. Pt's PCP recommended pt come here for second opinion (concerned for pulmonary embolism).

## 2019-12-23 NOTE — Telephone Encounter (Signed)
I called patient and let her know reason for cancelled Covid screen test.  I let her know [facility] RN had reviewed her nurse line call today and was concerned about her symptoms. Patient said she is at [facility] now to be evaluated. I let her know the preadmitting nurse will keep an eye on her notes and let her know if concerns regarding surgery proceeding next week.

## 2019-12-24 ENCOUNTER — Ambulatory Visit (INDEPENDENT_AMBULATORY_CARE_PROVIDER_SITE_OTHER): Payer: 59 | Admitting: Family Medicine

## 2019-12-24 ENCOUNTER — Ambulatory Visit (INDEPENDENT_AMBULATORY_CARE_PROVIDER_SITE_OTHER): Payer: 59 | Admitting: Psychology

## 2019-12-24 ENCOUNTER — Other Ambulatory Visit: Payer: Self-pay

## 2019-12-24 ENCOUNTER — Encounter: Payer: Self-pay | Admitting: Family Medicine

## 2019-12-24 VITALS — BP 110/62 | HR 94 | Wt 225.5 lb

## 2019-12-24 DIAGNOSIS — F4323 Adjustment disorder with mixed anxiety and depressed mood: Secondary | ICD-10-CM

## 2019-12-24 DIAGNOSIS — R0602 Shortness of breath: Secondary | ICD-10-CM | POA: Insufficient documentation

## 2019-12-24 DIAGNOSIS — F329 Major depressive disorder, single episode, unspecified: Secondary | ICD-10-CM

## 2019-12-24 DIAGNOSIS — F32A Depression, unspecified: Secondary | ICD-10-CM

## 2019-12-24 DIAGNOSIS — Z23 Encounter for immunization: Secondary | ICD-10-CM | POA: Diagnosis not present

## 2019-12-24 LAB — D-DIMER, QUANTITATIVE: D-DIMER: <0.2 mg/L FEU (ref 0.00–0.49)

## 2019-12-24 NOTE — BH Specialist Note (Signed)
[place] Visit via Telemedicine (Telephone)  12/24/2019 TAAHIRA BIANCHINI [id number]   Session Start time: 4  Session End time: 430 Total time: 66  Referring Provider: Dr. Erin Hearing  Type of Visit: Telephonic Patient location: home [facility] Provider location: [facility] All persons participating in visit: pt and provider   The following statements were read to the patient and/or legal guardian that are established with the [facility] Provider.  "The purpose of this phone visit is to provide behavioral health care while limiting exposure to the coronavirus (COVID19).  There is a possibility of technology failure and discussed alternative modes of communication if that failure occurs."  "By engaging in this telephone visit, you consent to the provision of healthcare.  Additionally, you authorize for your insurance to be billed for the services provided during this telephone visit."   Patient and/or legal guardian consented to telephone visit: Yes   PRESENTING CONCERNS: Patient and/or family reports the following symptoms/concerns: Pt reports feeling disconnected from self and down. Pt denied SI. Pt reported that medication felt like is was working but past few days feeling disconnected from self.  Pt reported she might have had a panic attack last week but there is anxiety about if it was something else and is seeking medical attention by seeing Dr. Pilar Plate today.  Pt encouraged to talk to Dr. Erin Hearing about medication.  Pt insighful about needs and who is helpful for her.   Pt and Dr. Hartford Poli engaged in a conversation around why it is important to implement coping skills for anxiety so anxiety does not build and create depression.   Duration of problem: 2 months; Severity of problem: moderate  STRENGTHS (Protective Factors/Coping Skills): Insight to syx and family support   GOALS ADDRESSED: Patient will: 1.  Reduce symptoms of: anxiety; Pt reports possible panic attack and  feeling disconnected from self  2.  Increase knowledge and/or ability of: coping skills : meditation and self-care  3.  Demonstrate ability to: Increase healthy adjustment to current life circumstances  INTERVENTIONS: Interventions utilized:  Behavioral Activation and Brief CBT Standardized Assessments completed: Not Needed  ASSESSMENT: Patient currently experiencing (adjustment disorder) anxiety exacerbated by grief causing depressive syx.   Patient may benefit from supportive and CBT therapy   PLAN: 1. Follow up with behavioral health clinician on : safety plan for SI; continue medication; utilize coping skills for anxiety 2. Behavioral recommendations: continue using coping skills such as medication to manage anxiety  3. Referral(s): [place] (In Clinic)  Erlinda Hong, PhD., LMFT-A

## 2019-12-24 NOTE — Patient Instructions (Addendum)
Were going to the lab today.  The labs got a D-dimer.  We should have the results by tomorrow.  If your results are high, we will get a low-dose chest CT to ensure that you do not have a pulmonary embolism.  I have a low suspicion that you do have a pulmonary embolism but I think it is reasonable that we take steps to evaluate you because of your recent Covid infection.  I encourage you to follow-up with Dr. Erin Hearing 4 to 5 weeks after starting your Zoloft to ensure the ear treatment is appropriate.

## 2019-12-24 NOTE — Assessment & Plan Note (Signed)
This this is primarily in terms of anxiety at today's visit.  She reports general improvement since the initiation of her sertraline.  As she is only 3 weeks out, she was encouraged to wait 1-2 more weeks before following up with Dr. Erin Hearing to consider titration of medication.  She was encouraged to follow-up with Dr. Hartford Poli as scheduled.

## 2019-12-24 NOTE — Progress Notes (Signed)
   CHIEF COMPLAINT / HPI:  Occasional shortness of breath Anita [name] has done some reading online and she is concerned that she may have a pulmonary embolism.  Roughly 1 month ago, she felt ill with a COVID-19 infection.  Following the resolution of her infection, she has continued to experience episodic shortness of breath at times and occasional sharp chest pain that can be present on multiple occasions over her left and right chest.  She recently read online that a COVID-19 infection can increase the chance of a pulmonary embolism and she wants to ensure that she does not have a pulmonary embolism.  She is particularly interested because she has a surgery coming up next week to remove a Bartholin cyst and wants to ensure that there will not be any issue with the anesthesia for the surgery.  Anxiety/depression She is aware that she also experiences symptoms of anxiety that may be complicating the above-noted issue.  She was recently started on sertraline about 3 weeks ago and is being seen for CBT.  She believes that she has experienced some improvement in her symptoms since initiating sertraline she is not sure when she needs to follow-up for this issue.  She notes that her improvement on the sertraline may have plateaued and she thinks she may benefit from an increased dose.   PERTINENT  PMH / PSH: Recent COVID-19 infection, depression   OBJECTIVE: BP 110/62   Pulse 94   Wt 225 lb 8 oz (102.3 kg)   SpO2 99%   BMI 34.29 kg/m    General: Well-appearing 24 year old female.  No acute distress Respiratory: Breathing comfortably on room air.  Lungs clear to auscultation bilaterally.  Saturating well on room air.  Normal respiratory effort. Cardiac: Regular rate and rhythm.  No murmurs/rubs/gallops.  Strong radial pulse.  ASSESSMENT / PLAN:  SOB (shortness of breath) Discussed the possibility of a DVT/PE in the setting of a recent coronavirus infection.  We discussed that there is not  currently an optimize clinical to help make this decision.  Engaged in shared decision-making regarding whether or not to drive a D-dimer.  Ultimately we did draw a D-dimer.  D-dimer level normal.  Anita [name] was called and informed of results.  This indicates there is no evidence of a blood clot.  Based on my history and physical exam today, I cannot find any clear evidence of any pulmonary disease that would preclude her from having surgery done next week.  Depression This this is primarily in terms of anxiety at today's visit.  She reports general improvement since the initiation of her sertraline.  As she is only 3 weeks out, she was encouraged to wait 1-2 more weeks before following up with Dr. Erin Hearing to consider titration of medication.  She was encouraged to follow-up with Dr. Hartford Poli as scheduled.     Matilde Haymaker, MD [place]

## 2019-12-24 NOTE — Assessment & Plan Note (Signed)
Discussed the possibility of a DVT/PE in the setting of a recent coronavirus infection.  We discussed that there is not currently an optimize clinical to help make this decision.  Engaged in shared decision-making regarding whether or not to drive a D-dimer.  Ultimately we did draw a D-dimer.  D-dimer level normal.  Ms. Ballen was called and informed of results.  This indicates there is no evidence of a blood clot.  Based on my history and physical exam today, I cannot find any clear evidence of any pulmonary disease that would preclude her from having surgery done next week.

## 2019-12-25 ENCOUNTER — Ambulatory Visit: Payer: 59 | Admitting: Family Medicine

## 2019-12-25 ENCOUNTER — Other Ambulatory Visit: Payer: Self-pay | Admitting: Family Medicine

## 2019-12-26 ENCOUNTER — Other Ambulatory Visit: Payer: Self-pay

## 2019-12-26 ENCOUNTER — Encounter (HOSPITAL_BASED_OUTPATIENT_CLINIC_OR_DEPARTMENT_OTHER): Payer: Self-pay | Admitting: Obstetrics and Gynecology

## 2019-12-26 NOTE — Progress Notes (Signed)
Spoke w/ via phone for pre-op interview---Anita [name] needs dos---- urine poct             [name] results------ d dimer 12-24-2019 epic COVID test ------11-30-2019 positive covid test patient reported following symptoms of covid short of breath, chest pain, loss of sense of smell and taste, sore throat, sinus issues, quarantine ended 12-04-2019 no symptoms since.  Arrive at -------700 am 12-31-2019 NPO after ------midinight Medications to take morning of surgery -----sertraline, zyrtec Diabetic medication -----n/a Patient Special Instructions ----- Pre-Op special Istructions ----- Patient verbalized understanding of instructions that were given at this phone interview. Patient denies shortness of breath, chest pain, fever, cough a this phone interview.  [facility] medical md dr Collier Salina frank 01-03-2020 medical clearance note epic

## 2019-12-27 ENCOUNTER — Other Ambulatory Visit (HOSPITAL_COMMUNITY): Payer: Self-pay

## 2019-12-29 ENCOUNTER — Ambulatory Visit (INDEPENDENT_AMBULATORY_CARE_PROVIDER_SITE_OTHER): Payer: 59 | Admitting: Psychology

## 2019-12-29 ENCOUNTER — Other Ambulatory Visit: Payer: Self-pay

## 2019-12-29 DIAGNOSIS — F4323 Adjustment disorder with mixed anxiety and depressed mood: Secondary | ICD-10-CM

## 2019-12-29 NOTE — BH Specialist Note (Signed)
Integrated Behavioral Health Visit via Telemedicine (Telephone)  12/29/2019 ARASELI BAXTER [id number]   Session Start time: 59  Session End time: 4 Total time: 30  Referring Provider: Casimer Lanius  Type of Visit: Telephonic Patient location: home [facility] Provider location: [facility] All persons participating in visit: pt and provider    The following statements were read to the patient and/or legal guardian that are established with the [facility] Provider.  "The purpose of this phone visit is to provide behavioral health care while limiting exposure to the coronavirus (COVID19).  There is a possibility of technology failure and discussed alternative modes of communication if that failure occurs."  "By engaging in this telephone visit, you consent to the provision of healthcare.  Additionally, you authorize for your insurance to be billed for the services provided during this telephone visit."   Patient and/or legal guardian consented to telephone visit: Yes   PRESENTING CONCERNS: Patient and/or family reports the following symptoms/concerns: Pt reported she has been having anxiety about her upcoming surgery.  She reports that her recent doctors visit ruled out other medical concerns and the cause of her heart palpitations was anxiety.  Pt reported her anxiety around her surgery relates to worry of what will happen and lack of control of what will happen.  Pt and Dr. Hartford Poli talked about what the challenging thoughts would be for those anxious thoughts.  Pt reported she would say knowing it is a simple procedure and have lots of support after and before surgery.   Pt denied SI.  Duration of problem: 27months; Severity of problem: moderate  STRENGTHS (Protective Factors/Coping Skills): Insight of syx and family support   GOALS ADDRESSED: Patient will: 1.  Reduce symptoms of: anxiety: pt reports anxiety attacks; depression syx have decreased; no SI  2.  Increase knowledge and/or ability  of: coping skills : challenging anxious thoughts and using meditation  3.  Demonstrate ability to: Increase healthy adjustment to current life circumstances  INTERVENTIONS: Interventions utilized:  Brief CBT Standardized Assessments completed: Not Needed  ASSESSMENT: Patient currently experiencing anxiety (adjustment disorder) exacerbated by grief causing depressive syx .   Patient may benefit from CBT Therapy   PLAN: 1. Follow up with behavioral health clinician on : anxious cycle and challenging anxious cycle  2. Behavioral recommendations: CBT and continue meditation  3. Referral(s): [place] (In Clinic)  Erlinda Hong, PhD., LMFT-A

## 2019-12-30 NOTE — Anesthesia Preprocedure Evaluation (Addendum)
Anesthesia Evaluation  Patient identified by MRN, date of birth, ID band Patient awake    Reviewed: Allergy & Precautions, NPO status , Patient's Chart, lab work & pertinent test results  Airway Mallampati: I  TM Distance: >3 FB Neck ROM: Full    Dental no notable dental hx.    Pulmonary neg pulmonary ROS,    Pulmonary exam normal breath sounds clear to auscultation       Cardiovascular negative cardio ROS Normal cardiovascular exam Rhythm:Regular Rate:Normal     Neuro/Psych  Headaches, PSYCHIATRIC DISORDERS Depression    GI/Hepatic negative GI ROS, Neg liver ROS,   Endo/Other  negative endocrine ROS  Renal/GU negative Renal ROS     Musculoskeletal negative musculoskeletal ROS (+)   Abdominal (+) + obese,   Peds  Hematology negative hematology ROS (+)   Anesthesia Other Findings bartholin gland cyst  Reproductive/Obstetrics hcg negative                            Anesthesia Physical Anesthesia Plan  ASA: II  Anesthesia Plan: General   Post-op Pain Management:    Induction: Intravenous  PONV Risk Score and Plan: 4 or greater and Ondansetron, Dexamethasone, Midazolam and Treatment may vary due to age or medical condition  Airway Management Planned: LMA  Additional Equipment:   Intra-op Plan:   Post-operative Plan: Extubation in OR  Informed Consent: I have reviewed the patients History and Physical, chart, labs and discussed the procedure including the risks, benefits and alternatives for the proposed anesthesia with the patient or authorized representative who has indicated his/her understanding and acceptance.     Dental advisory given  Plan Discussed with: CRNA  Anesthesia Plan Comments:        Anesthesia Quick Evaluation

## 2019-12-31 ENCOUNTER — Ambulatory Visit (HOSPITAL_BASED_OUTPATIENT_CLINIC_OR_DEPARTMENT_OTHER): Payer: 59 | Admitting: Anesthesiology

## 2019-12-31 ENCOUNTER — Ambulatory Visit (HOSPITAL_BASED_OUTPATIENT_CLINIC_OR_DEPARTMENT_OTHER)
Admission: RE | Admit: 2019-12-31 | Discharge: 2019-12-31 | Disposition: A | Discharge status: Home / Self Care | Payer: 59 | Attending: Obstetrics and Gynecology | Admitting: Obstetrics and Gynecology

## 2019-12-31 ENCOUNTER — Telehealth: Payer: Self-pay | Admitting: Obstetrics and Gynecology

## 2019-12-31 ENCOUNTER — Encounter (HOSPITAL_BASED_OUTPATIENT_CLINIC_OR_DEPARTMENT_OTHER)
Admission: RE | Disposition: A | Discharge status: Home / Self Care | Payer: Self-pay | Source: Home / Self Care | Attending: Obstetrics and Gynecology

## 2019-12-31 ENCOUNTER — Encounter (HOSPITAL_BASED_OUTPATIENT_CLINIC_OR_DEPARTMENT_OTHER): Payer: Self-pay | Admitting: Obstetrics and Gynecology

## 2019-12-31 DIAGNOSIS — N907 Vulvar cyst: Secondary | ICD-10-CM | POA: Diagnosis present

## 2019-12-31 DIAGNOSIS — N750 Cyst of Bartholin's gland: Secondary | ICD-10-CM | POA: Insufficient documentation

## 2019-12-31 HISTORY — PX: BARTHOLIN CYST MARSUPIALIZATION: SHX5383

## 2019-12-31 LAB — POCT PREGNANCY, URINE: Preg Test, Ur: NEGATIVE

## 2019-12-31 LAB — TYPE AND SCREEN
ABO/RH(D): A NEG
Antibody Screen: NEGATIVE

## 2019-12-31 LAB — ABO/RH: ABO/RH(D): A NEG

## 2019-12-31 SURGERY — MARSUPIALIZATION, CYST, BARTHOLIN'S GLAND
Anesthesia: General | Site: Vagina

## 2019-12-31 MED ORDER — PROMETHAZINE HCL 25 MG/ML IJ SOLN
6.2500 mg | INTRAMUSCULAR | Status: DC | PRN
  Administered 2019-12-31: 10:00:00 6.25 mg via INTRAVENOUS
  Filled 2019-12-31: qty 1

## 2019-12-31 MED ORDER — BUPIVACAINE-EPINEPHRINE 0.5% -1:200000 IJ SOLN
INTRAMUSCULAR | Status: DC | PRN
  Administered 2019-12-31: 4 mL

## 2019-12-31 MED ORDER — FENTANYL CITRATE (PF) 100 MCG/2ML IJ SOLN
INTRAMUSCULAR | Status: AC
  Filled 2019-12-31: qty 2

## 2019-12-31 MED ORDER — ACETAMINOPHEN 650 MG RE SUPP
650.0000 mg | RECTAL | Status: DC | PRN
  Filled 2019-12-31: qty 1

## 2019-12-31 MED ORDER — LACTATED RINGERS IV SOLN
INTRAVENOUS | Status: DC
  Filled 2019-12-31: qty 1000

## 2019-12-31 MED ORDER — PROMETHAZINE HCL 25 MG/ML IJ SOLN
INTRAMUSCULAR | Status: AC
  Filled 2019-12-31: qty 1

## 2019-12-31 MED ORDER — SODIUM CHLORIDE 0.9 % IV SOLN
250.0000 mL | INTRAVENOUS | Status: DC | PRN
  Filled 2019-12-31: qty 250

## 2019-12-31 MED ORDER — ACETAMINOPHEN 500 MG PO TABS
ORAL_TABLET | ORAL | Status: AC
  Filled 2019-12-31: qty 2

## 2019-12-31 MED ORDER — ONDANSETRON HCL 4 MG/2ML IJ SOLN
INTRAMUSCULAR | Status: AC
  Filled 2019-12-31: qty 2

## 2019-12-31 MED ORDER — FAMOTIDINE 20 MG PO TABS
20.0000 mg | ORAL_TABLET | Freq: Once | ORAL | Status: AC
  Administered 2019-12-31: 20 mg via ORAL
  Filled 2019-12-31: qty 1

## 2019-12-31 MED ORDER — FENTANYL CITRATE (PF) 100 MCG/2ML IJ SOLN
INTRAMUSCULAR | Status: DC | PRN
  Administered 2019-12-31 (×3): 50 ug via INTRAVENOUS

## 2019-12-31 MED ORDER — ONDANSETRON HCL 4 MG/2ML IJ SOLN
INTRAMUSCULAR | Status: DC | PRN
  Administered 2019-12-31: 4 mg via INTRAVENOUS

## 2019-12-31 MED ORDER — KETOROLAC TROMETHAMINE 30 MG/ML IJ SOLN
30.0000 mg | Freq: Once | INTRAMUSCULAR | Status: DC | PRN
  Filled 2019-12-31: qty 1

## 2019-12-31 MED ORDER — OXYCODONE HCL 5 MG PO TABS: 5.0000 mg | ORAL_TABLET | Freq: Four times a day (QID) | ORAL | 0 refills | Status: DC | PRN

## 2019-12-31 MED ORDER — MIDAZOLAM HCL 2 MG/2ML IJ SOLN
INTRAMUSCULAR | Status: DC | PRN
  Administered 2019-12-31: 2 mg via INTRAVENOUS

## 2019-12-31 MED ORDER — LIDOCAINE 2% (20 MG/ML) 5 ML SYRINGE
INTRAMUSCULAR | Status: AC
  Filled 2019-12-31: qty 5

## 2019-12-31 MED ORDER — MIDAZOLAM HCL 2 MG/2ML IJ SOLN
INTRAMUSCULAR | Status: AC
  Filled 2019-12-31: qty 2

## 2019-12-31 MED ORDER — KETOROLAC TROMETHAMINE 30 MG/ML IJ SOLN
INTRAMUSCULAR | Status: AC
  Filled 2019-12-31: qty 1

## 2019-12-31 MED ORDER — PROPOFOL 10 MG/ML IV BOLUS
INTRAVENOUS | Status: AC
  Filled 2019-12-31: qty 40

## 2019-12-31 MED ORDER — KETOROLAC TROMETHAMINE 30 MG/ML IJ SOLN
INTRAMUSCULAR | Status: DC | PRN
  Administered 2019-12-31: 30 mg via INTRAVENOUS

## 2019-12-31 MED ORDER — DEXAMETHASONE SODIUM PHOSPHATE 10 MG/ML IJ SOLN
INTRAMUSCULAR | Status: AC
  Filled 2019-12-31: qty 1

## 2019-12-31 MED ORDER — SODIUM CHLORIDE 0.9% FLUSH
3.0000 mL | Freq: Two times a day (BID) | INTRAVENOUS | Status: DC
  Filled 2019-12-31: qty 3

## 2019-12-31 MED ORDER — OXYCODONE HCL 5 MG/5ML PO SOLN
5.0000 mg | Freq: Once | ORAL | Status: DC | PRN
  Filled 2019-12-31: qty 5

## 2019-12-31 MED ORDER — HYDROMORPHONE HCL 1 MG/ML IJ SOLN
0.2500 mg | INTRAMUSCULAR | Status: DC | PRN
  Filled 2019-12-31: qty 0.5

## 2019-12-31 MED ORDER — ACETAMINOPHEN 500 MG PO TABS
1000.0000 mg | ORAL_TABLET | Freq: Once | ORAL | Status: AC
  Administered 2019-12-31: 1000 mg via ORAL
  Filled 2019-12-31: qty 2

## 2019-12-31 MED ORDER — ACETAMINOPHEN 325 MG PO TABS
650.0000 mg | ORAL_TABLET | ORAL | Status: DC | PRN
  Filled 2019-12-31: qty 2

## 2019-12-31 MED ORDER — FAMOTIDINE 20 MG PO TABS
ORAL_TABLET | ORAL | Status: AC
  Filled 2019-12-31: qty 1

## 2019-12-31 MED ORDER — WHITE PETROLATUM EX OINT
TOPICAL_OINTMENT | CUTANEOUS | Status: AC
  Filled 2019-12-31: qty 5

## 2019-12-31 MED ORDER — OXYCODONE HCL 5 MG PO TABS
5.0000 mg | ORAL_TABLET | Freq: Once | ORAL | Status: DC | PRN
  Filled 2019-12-31: qty 1

## 2019-12-31 MED ORDER — BUPIVACAINE-EPINEPHRINE 0.25% -1:200000 IJ SOLN
10.0000 mL | Freq: Once | INTRAMUSCULAR | Status: DC
  Filled 2019-12-31: qty 10

## 2019-12-31 MED ORDER — ACETAMINOPHEN 325 MG PO TABS: 650.0000 mg | ORAL_TABLET | Freq: Four times a day (QID) | ORAL | Status: DC | PRN

## 2019-12-31 MED ORDER — PHENYLEPHRINE 40 MCG/ML (10ML) SYRINGE FOR IV PUSH (FOR BLOOD PRESSURE SUPPORT)
PREFILLED_SYRINGE | INTRAVENOUS | Status: AC
  Filled 2019-12-31: qty 10

## 2019-12-31 MED ORDER — PROPOFOL 10 MG/ML IV BOLUS
INTRAVENOUS | Status: DC | PRN
  Administered 2019-12-31: 180 mg via INTRAVENOUS

## 2019-12-31 MED ORDER — SODIUM CHLORIDE 0.9% FLUSH
3.0000 mL | INTRAVENOUS | Status: DC | PRN
  Filled 2019-12-31: qty 3

## 2019-12-31 MED ORDER — OXYCODONE HCL 5 MG PO TABS
5.0000 mg | ORAL_TABLET | ORAL | Status: DC | PRN
  Filled 2019-12-31: qty 2

## 2019-12-31 MED ORDER — LIDOCAINE 2% (20 MG/ML) 5 ML SYRINGE
INTRAMUSCULAR | Status: DC | PRN
  Administered 2019-12-31: 60 mg via INTRAVENOUS

## 2019-12-31 MED ORDER — DEXAMETHASONE SODIUM PHOSPHATE 10 MG/ML IJ SOLN
INTRAMUSCULAR | Status: DC | PRN
  Administered 2019-12-31 (×2): 5 mg via INTRAVENOUS

## 2019-12-31 MED ORDER — IBUPROFEN 600 MG PO TABS: 600.0000 mg | ORAL_TABLET | Freq: Four times a day (QID) | ORAL | 0 refills | Status: DC | PRN

## 2019-12-31 SURGICAL SUPPLY — 20 items
BLADE SURG 15 STRL LF DISP TIS (BLADE) ×1 IMPLANT
BLADE SURG 15 STRL SS (BLADE) ×2
CATH ROBINSON RED A/P 16FR (CATHETERS) ×2 IMPLANT
COVER WAND RF STERILE (DRAPES) ×2 IMPLANT
GAUZE PACKING 1/2X5YD (GAUZE/BANDAGES/DRESSINGS) IMPLANT
GLOVE BIO SURGEON STRL SZ8 (GLOVE) ×4 IMPLANT
GLOVE BIOGEL PI IND STRL 8 (GLOVE) ×1 IMPLANT
GLOVE BIOGEL PI INDICATOR 8 (GLOVE) ×1
GOWN STRL REUS W/TWL XL LVL3 (GOWN DISPOSABLE) ×4 IMPLANT
NEEDLE HYPO 22GX1.5 SAFETY (NEEDLE) ×2 IMPLANT
PACK VAGINAL WOMENS (CUSTOM PROCEDURE TRAY) ×2 IMPLANT
PAD OB MATERNITY 4.3X12.25 (PERSONAL CARE ITEMS) ×2 IMPLANT
PAD PREP 24X48 CUFFED NSTRL (MISCELLANEOUS) ×2 IMPLANT
PENCIL SMOKE EVACUATOR (MISCELLANEOUS) ×1 IMPLANT
SUT VIC AB 3-0 SH 27 (SUTURE)
SUT VIC AB 3-0 SH 27X BRD (SUTURE) IMPLANT
SUT VICRYL RAPIDE 4/0 PS 2 (SUTURE) ×2 IMPLANT
SWAB COLLECTION DEVICE MRSA (MISCELLANEOUS) IMPLANT
SWAB CULTURE ESWAB REG 1ML (MISCELLANEOUS) IMPLANT
TOWEL OR 17X26 10 PK STRL BLUE (TOWEL DISPOSABLE) ×2 IMPLANT

## 2019-12-31 NOTE — Discharge Instructions (Signed)
Alternate tylenol every 4-6 hours and ibuprofen every 6 hours (that way you are taking one of the two medications every 3 hours) for the first few days after surgery If any severe pain take oxycodone as needed Monitor for signs of excessive bleeding if saturating a pad every hour or more frequently for at least 2 hours, or passing blood clots golf ball size or larger. If this is occurring let us know. If you have any milky, foul smelling discharge in the area, excessive warmth or spreading redness around the skin, fever or chills, let us know right away Avoid placing anything in the vagina for at least 2 weeks (no intercourse in that time), avoid tampons You may need to wear a pad    Post Anesthesia Home Care Instructions  Activity: Get plenty of rest for the remainder of the day. A responsible individual must stay with you for 24 hours following the procedure.  For the next 24 hours, DO NOT: -Drive a car -Paediatric nurse -Drink alcoholic beverages -Take any medication unless instructed by your physician -Make any legal decisions or sign important papers.  Meals: Start with liquid foods such as gelatin or soup. Progress to regular foods as tolerated. Avoid greasy, spicy, heavy foods. If nausea and/or vomiting occur, drink only clear liquids until the nausea and/or vomiting subsides. Call your physician if vomiting continues.  Special Instructions/Symptoms: Your throat may feel dry or sore from the anesthesia or the breathing tube placed in your throat during surgery. If this causes discomfort, gargle with warm salt water. The discomfort should disappear within 24 hours.  If you had a scopolamine patch placed behind your ear for the management of post- operative nausea and/or vomiting:  1. The medication in the patch is effective for 72 hours, after which it should be removed.  Wrap patch in a tissue and discard in the trash. Wash hands thoroughly with soap and water. 2. You may remove  the patch earlier than 72 hours if you experience unpleasant side effects which may include dry mouth, dizziness or visual disturbances. 3. Avoid touching the patch. Wash your hands with soap and water after contact with the patch.

## 2019-12-31 NOTE — Anesthesia Postprocedure Evaluation (Signed)
Anesthesia Post Note  Patient: Anita [name]  Procedure(s) Performed: BARTHOLIN CYST MARSUPIALIZATION (N/A Vagina )     Patient location during evaluation: PACU Anesthesia Type: General Level of consciousness: awake and alert Pain management: pain level controlled Vital Signs Assessment: post-procedure vital signs reviewed and stable Respiratory status: spontaneous breathing, nonlabored ventilation, respiratory function stable and patient connected to nasal cannula oxygen Cardiovascular status: blood pressure returned to baseline and stable Postop Assessment: no apparent nausea or vomiting Anesthetic complications: no    Last Vitals:  Vitals:   12/31/19 1045 12/31/19 1125  BP: 133/61 121/65  Pulse: 78 75  Resp: 13 18  Temp: 36.7 C 37.3 C  SpO2: 100% 100%    Last Pain:  Vitals:   12/31/19 1125  TempSrc:   PainSc: 2                  Jovanni Rash P Suleman Gunning

## 2019-12-31 NOTE — Anesthesia Procedure Notes (Signed)
Procedure Name: LMA Insertion Date/Time: 12/31/2019 8:53 AM Performed by: Suan Halter, CRNA Pre-anesthesia Checklist: Patient identified, Emergency Drugs available, Suction available and Patient being monitored Patient Re-evaluated:Patient Re-evaluated prior to induction Oxygen Delivery Method: Circle system utilized Preoxygenation: Pre-oxygenation with 100% oxygen Induction Type: IV induction Ventilation: Mask ventilation without difficulty LMA: LMA inserted LMA Size: 4.0 Number of attempts: 1 Airway Equipment and Method: Bite block Placement Confirmation: positive ETCO2 Tube secured with: Tape Dental Injury: Teeth and Oropharynx as per pre-operative assessment

## 2019-12-31 NOTE — Telephone Encounter (Signed)
Postoperative phone checkup  I called patient to see how she is doing she says she is having a little soreness in the surgical site but nothing more than expected.  She has her pain medication prescriptions filled.  Overall she says she is doing fine and has no concerns at this time.  She is invited to call the office with any concerns that come up in her postoperative recovery.  All questions are answered.  Joseph Pierini MD

## 2019-12-31 NOTE — Op Note (Addendum)
Name: Anita [name] Age: 24 y.o.  Date of Birth: 04-Nov-1996 Medical Record #: [id number]   Operative Note   Preoperative Diagnosis:  Left vulvar cyst Procedure: Marsupialization of Bartholin Gland Cyst Postoperative Diagnosis: Left bartholin gland cyst Surgeon: Joseph Pierini, MD Anesthesia: General LMA, local with 0.5% bupivacaine with 1:200,000 epinephrine Specimen: Bartholin gland cyst wall Estimated Blood Loss:  5 mL IV Fluids:  Per anesthesia Complication: none. Date: 12/31/19  INDICATION: The patient has a recurrent left Bartholin gland cyst that was incised and drained in the outpatient setting several times with reaccumulation noted.  She did not want to trial a Word catheter and therefore marsupialization of the cyst was offered and discussed and she wanted to proceed.  DESCRIPTION OF PROCEDURE:  Under general anesthesia, [name] was placed in the dorsolithotomy position with legs in Yellofin stirrups.  A surgical team time out was performed to verify and agree on procedure and patient consent. A bimanual exam was performed.  The patient was prepped and draped in the usual sterile fashion. SCDs were applied.    The vulva were examined carefully.  There was confirmed to be a left Bartholin gland cyst.  A 15 blade scalpel was used to make a 2 cm diameter elliptical incision over the cyst on the inner side of the labia minora outside of the hymen.  The epithelium was fully transected until the translucent cyst wall was visualized.  The cyst was sharply opened which ejected a copious amount of clear and proteinaceous contents.  There was no evidence of purulence or foul odor, no evidence of infection over the skin.  A small portion of the ceiling of the cyst cavity was excised and the specimen was collected and sent to pathology for analysis.  The remainder of the cyst cavity was gently probed with a hemostat revealing additional septations blocking off deeper pockets of the cyst  which resulted in small bits of additional drainage.  With the cyst cavity fully exposed, the edges of the cyst wall were grasped with the pickups and tacked to the incised labial epithelial edge with several simple interrupted stitches using #4-0 Vicryl Rapide.    The epithelial edges were then injected with approximately 5 mL of local anesthetic.  Hemostasis was confirmed.  All sponge, instrument, needle counts were correct x2.  The patient tolerated the procedure well and was brought to the recovery room in stable condition.  I did speak with the patient's mother following the procedure to let her know that everything went well and to briefly review postoperative instructions.      Joseph Pierini, M.D. 12/31/19

## 2019-12-31 NOTE — Transfer of Care (Signed)
Immediate Anesthesia Transfer of Care Note  Patient: Anita [name]  Procedure(s) Performed: Procedure(s) (LRB): BARTHOLIN CYST MARSUPIALIZATION (N/A)  Patient Location: PACU  Anesthesia Type: General  Level of Consciousness: awake, oriented, sedated and patient cooperative  Airway & Oxygen Therapy: Patient Spontanous Breathing and Patient connected to face mask oxygen  Post-op Assessment: Report given to PACU RN and Post -op Vital signs reviewed and stable  Post vital signs: Reviewed and stable  Complications: No apparent anesthesia complications Last Vitals:  Vitals Value Taken Time  BP 135/57 12/31/19 0952  Temp    Pulse 92 12/31/19 0954  Resp 18 12/31/19 0954  SpO2 100 % 12/31/19 0954  Vitals shown include unvalidated device data.  Last Pain:  Vitals:   12/31/19 0951  TempSrc:   PainSc: (P) 0-No pain      Patients Stated Pain Goal: 5 (12/31/19 0726)

## 2019-12-31 NOTE — Brief Op Note (Signed)
12/31/2019  11:15 AM  PATIENT:  Felizardo Hoffmann  24 y.o. female  PRE-OPERATIVE DIAGNOSIS:  bartholin gland cyst  POST-OPERATIVE DIAGNOSIS:  bartholin gland cyst  PROCEDURE:  Procedure(s): BARTHOLIN CYST MARSUPIALIZATION (N/A)  SURGEON:  Surgeon(s) and Role:    Joseph Pierini, MD - Primary  PHYSICIAN ASSISTANT:     ANESTHESIA:   local and general  EBL:  5 mL   BLOOD ADMINISTERED:none  DRAINS: none   LOCAL MEDICATIONS USED:  0.5% bupivacaine with 1:200,000 epinephrine  SPECIMEN:  Source of Specimen:  bartholin gland cyst wall  DISPOSITION OF SPECIMEN:  PATHOLOGY  COUNTS:  YES  TOURNIQUET:  * No tourniquets in log *  DICTATION: .Note written in EPIC  PLAN OF CARE: Discharge to home after PACU  PATIENT DISPOSITION:  PACU - hemodynamically stable.   Delay start of Pharmacological VTE agent (>24hrs) due to surgical blood loss or risk of bleeding: not applicable

## 2019-12-31 NOTE — H&P (Signed)
GYN Pre op Note  Patient seen at bedside, procedure plan reviewed. No changes to patient's medical conditions or status since she was seen in the office. Denies CP, SOB, fever, UTI symptoms. Negative UPT. Plan for marsupialization of vulvar cyst. Patient agrees to proceed as planned.  Joseph Pierini MD

## 2020-01-01 LAB — SURGICAL PATHOLOGY

## 2020-01-02 ENCOUNTER — Telehealth: Payer: Self-pay | Admitting: *Deleted

## 2020-01-02 NOTE — Telephone Encounter (Signed)
Patient called to follow up with from surgery on 12/31/19 "BARTHOLIN CYST MARSUPIALIZATION". Patient said she noticed a vaginal sour smell asked if this is normal? Does have bleeding, but it is not heavy ( I did tell her that is normal) bleeding is getting lighter daily.  Also she asked if you went behind the cyst? Because that area still appears to be big. She reports when she would come in to have cyst drain that area would be flat. And it is not flat now and still appears big.    Please advise

## 2020-01-02 NOTE — Telephone Encounter (Signed)
Not sure without looking at what she means by big. The area may be swollen from the surgery, but the cyst was opened and drained. Unless there is some other cyst deeper down in the tissue? If no purulent drainage or spreading redness then the odor would be normal if just a 'sour' smell, but if any discharge from the vagina, sometimes there can be BV. If concerned about any of these things, would be best to come in to have it looked at. If she wants to give it a few more days and use an ice pack on the bottom in case it is just swelling and see how it goes before coming in if not better, then that would be fine too.

## 2020-01-02 NOTE — Telephone Encounter (Signed)
Patient informed. 

## 2020-01-06 ENCOUNTER — Ambulatory Visit (INDEPENDENT_AMBULATORY_CARE_PROVIDER_SITE_OTHER): Payer: 59 | Admitting: Psychology

## 2020-01-06 ENCOUNTER — Telehealth (INDEPENDENT_AMBULATORY_CARE_PROVIDER_SITE_OTHER): Payer: 59 | Admitting: Family Medicine

## 2020-01-06 ENCOUNTER — Telehealth: Payer: Self-pay

## 2020-01-06 DIAGNOSIS — F329 Major depressive disorder, single episode, unspecified: Secondary | ICD-10-CM | POA: Diagnosis not present

## 2020-01-06 DIAGNOSIS — F32A Depression, unspecified: Secondary | ICD-10-CM

## 2020-01-06 DIAGNOSIS — F4323 Adjustment disorder with mixed anxiety and depressed mood: Secondary | ICD-10-CM | POA: Diagnosis not present

## 2020-01-06 NOTE — Telephone Encounter (Signed)
Thanks

## 2020-01-06 NOTE — Telephone Encounter (Signed)
I read patient Dr. Scarlette Ar reply to her questions. She said she thinks what she is seeing is fine.  She will call if any doubt and would like to come for visit.

## 2020-01-06 NOTE — Telephone Encounter (Signed)
Yes, it is hard to triage this kind of thing with certainty over the phone.  Stitches are light in color as they dissolve, drainage is normal because the cyst is now open to the outside, but will eventually dry up over time, and the drainage should be kind of sticky and crusty. As long as not purulent, spreading redness, or any odor, then should be okay. As always, if any doubt, please just come in for [place] to take a look at it. She does have a post op visit scheduled, correct?

## 2020-01-06 NOTE — Assessment & Plan Note (Signed)
Improved.  She would like to stay on zoloft for another few months.  We agreed exercise walking and treadmill would be a good goal and help improve her symptoms and weight

## 2020-01-06 NOTE — Progress Notes (Signed)
[place] Telemedicine Visit  Patient consented to have virtual visit. Method of visit: Video  Encounter participants: Patient: Anita [name] - located at home Provider: Lind Covert - located at office Others (if applicable): no  Chief Complaint: follow up depression  HPI:  Overall feels improved.  Had a few anxious emotional days before Bartholins surgery but now feeling better.  Coping better with her recent loss.   More energy and less sadness.  No suicidal ideation.  Does report she sometimes has trouble falling asleep but this has been a long term issue.  Taking zoloft 50 mg daily.  Talking with Dr Hartford Poli regularly  Is not regularly exercising   Exam:  Respiratory: normal Psych:  Cognition and judgment appear intact. Alert, communicative  and cooperative with normal attention span and concentration. No apparent delusions, illusions, hallucinations   Assessment/Plan:  Depression Improved.  She would like to stay on zoloft for another few months.  We agreed exercise walking and treadmill would be a good goal and help improve her symptoms and weight     Time spent during visit with patient: 12 minutes

## 2020-01-06 NOTE — Telephone Encounter (Signed)
Marsupilization on 12/31/19.  She calls today because "stiches look yellow".  The odor she had called about 2/19 is better. Area is still "draining" and then she elaborated to say it was just the "sticky wound drainage".  I asked her if it looked like it was healing well but she was not sure what that would look like. I offered office visit explaining that is the best way to be sure all is okay. She said she would rather me check with Dr. Raliegh Ip first and if he thought she should come for a visit she would.

## 2020-01-06 NOTE — Telephone Encounter (Signed)
Yes she does. Post op visit is scheduled 01/15/20.

## 2020-01-07 ENCOUNTER — Other Ambulatory Visit: Payer: Self-pay

## 2020-01-07 NOTE — BH Specialist Note (Signed)
Integrated Behavioral Health Visit via Telemedicine (Telephone)  01/07/2020 Anita [name] [id number]   Session Start time: 62  Session End time: 4 Total time: 30  Referring Provider: Casimer Lanius  Type of Visit: Telephonic Patient location: home [facility] Provider location: [facility] All persons participating in visit: pt and provider    The following statements were read to the patient and/or legal guardian that are established with the [facility] Provider.  "The purpose of this phone visit is to provide behavioral health care while limiting exposure to the coronavirus (COVID19).  There is a possibility of technology failure and discussed alternative modes of communication if that failure occurs."  "By engaging in this telephone visit, you consent to the provision of healthcare.  Additionally, you authorize for your insurance to be billed for the services provided during this telephone visit."    PRESENTING CONCERNS: Patient and/or family reports the following symptoms/concerns: Pt reported she feels her anxiety has been controlled.  She reports she still struggles with sadness from time to time about the loss of her friend.  Pt and Dr. Hartford Poli engaged in conversation about allowing the sad emotions to be present and not labeling them as "bad".  Pt and Dr. Hartford Poli engage in conversation about normalizing grief.   Pt and Dr. Hartford Poli engaged in conversation about challenging anxious thoughts through meditation as preventative.  Pt denied SI.   Duration of problem: 3 months; Severity of problem: moderate  STRENGTHS (Protective Factors/Coping Skills): Insight and family support   GOALS ADDRESSED: Patient will: 1.  Reduce symptoms of: anxiety: Pt has been challenging anxiety syx of racing thoughts. Pt reports sadness  2.  Increase knowledge and/or ability of: coping skills : meditation everyday and engaging in emotional regulation  3.  Demonstrate ability to: Increase healthy adjustment to  current life circumstances  INTERVENTIONS: Interventions utilized:  Brief CBT Standardized Assessments completed: Not Needed  ASSESSMENT: Patient currently experiencing anxiety (adjustment disorder) exacerbated by grief causing depressive syx .   Patient may benefit from CBT Therapy   PLAN: 1. Follow up with behavioral health clinician on : anxious cycle and challenging anxious cycle  2. Behavioral recommendations: CBT and continue meditation  3. Referral(s): [place] (In Clinic)  Erlinda Hong, PhD., LMFT-A

## 2020-01-14 ENCOUNTER — Other Ambulatory Visit: Payer: Self-pay

## 2020-01-15 ENCOUNTER — Encounter: Payer: Self-pay | Admitting: Obstetrics and Gynecology

## 2020-01-15 ENCOUNTER — Ambulatory Visit (INDEPENDENT_AMBULATORY_CARE_PROVIDER_SITE_OTHER): Payer: 59 | Admitting: Obstetrics and Gynecology

## 2020-01-15 VITALS — BP 114/74

## 2020-01-15 DIAGNOSIS — Z4889 Encounter for other specified surgical aftercare: Secondary | ICD-10-CM

## 2020-01-15 DIAGNOSIS — N750 Cyst of Bartholin's gland: Secondary | ICD-10-CM

## 2020-01-15 DIAGNOSIS — Z9889 Other specified postprocedural states: Secondary | ICD-10-CM

## 2020-01-15 NOTE — Patient Instructions (Signed)
Okay to place tampons and resume light physical exercise activity that involves lower body To be on the safe side, avoid sexual intercourse for 2 more weeks while the area continues to heal

## 2020-01-15 NOTE — Progress Notes (Signed)
   LOVENE FISHELL  1996/10/02 [id number]  HPI The patient is a 24 y.o. G0P0000 who presents today for postoperative exam following a marsupialization of Bartholin gland cyst on the left vulva on 12/31/2019.  The patient reports minimal pain in the area, just a slight swelling, no malodorous drainage, no bleeding from the area.  She did just start her period today.  Physical Exam  BP 114/74   LMP 01/15/2020   PELVIC EXAM: VULVA: Appears normal, Bartholin gland incision cyst cavity and edges of the wound appear clean, dry, intact, well-healing, a few stitches are remaining, there is no drainage, no purulence, no abnormality.  No erythema. Caryn Bee is present for the examination  Assessment 24 yo G0 here for a 2-week postoperative visit following marsupialization of Bartholin gland cyst  Plan The patient is reassured that healing looks good at this time.  Okay to use tampons and to resume normal light activity, avoid heavy physical activity involving lower extremities, and/or sexual intercourse for at least 2 more weeks. Pathology report analysis of the cyst wall tissue indicated dense fibrous tissue without inflammation. All questions were answered to the patient's satisfaction.  Joseph Pierini MD 01/15/20

## 2020-01-20 ENCOUNTER — Ambulatory Visit (INDEPENDENT_AMBULATORY_CARE_PROVIDER_SITE_OTHER): Payer: 59 | Admitting: Psychology

## 2020-01-20 ENCOUNTER — Other Ambulatory Visit: Payer: Self-pay

## 2020-01-20 DIAGNOSIS — F4323 Adjustment disorder with mixed anxiety and depressed mood: Secondary | ICD-10-CM

## 2020-01-20 NOTE — BH Specialist Note (Signed)
Integrated Behavioral Health Visit via Telemedicine (Telephone)  01/20/2020 Felizardo Hoffmann [id number]   Session Start time: 2  Session End time: [id number] Total time: 20  Referring Provider: Casimer Lanius  Type of Visit: Telephonic Patient location: home [facility] Provider location: [facility]  All persons participating in visit: py and provider    The following statements were read to the patient and/or legal guardian that are established with the [facility] Provider.  "The purpose of this phone visit is to provide behavioral health care while limiting exposure to the coronavirus (COVID19).  There is a possibility of technology failure and discussed alternative modes of communication if that failure occurs."  "By engaging in this telephone visit, you consent to the provision of healthcare.  Additionally, you authorize for your insurance to be billed for the services provided during this telephone visit."   PRESENTING CONCERNS: Patient and/or family reports the following symptoms/concerns: Pt reported that she has been experiencing stress as of last Sunday because she got in trouble at  [facility].  Pt talked about she is very worried about others' views of others.   Pt and Dr. Hartford Poli discussed her cognitions around self-doubt and what is underneath the need for pleasing others.  Pt denied SI.    Pt reported that she will be attempting to meditate again.  PT and Dr. Hartford Poli discussed making small goals about meditation so it is not an overwhelming goal.  Duration of problem: 4 months ; Severity of problem: moderate  STRENGTHS (Protective Factors/Coping Skills): Insight and family support   GOALS ADDRESSED: Patient will: 1.  Reduce symptoms of: anxiety: thoughts around self-doubt and racing thoughts  2.  Increase knowledge and/or ability of: coping skills: meditation twice a week or engaging in emotion regulation  3.  Demonstrate ability to: Increase healthy adjustment to current life  circumstances  INTERVENTIONS: Interventions utilized:  Brief CBT and MI Standardized Assessments completed: Not Needed  ASSESSMENT: Patient currently experiencinganxiety (adjustment disorder) exacerbated by grief causing depressive syx.   Patient may benefit fromCBT Therapy  PLAN: 1. Follow up with behavioral health clinician on :anxious cycle and challenging anxious cycle 2. Behavioral recommendations:CBT and continue meditation 3. Referral(s):[place] (In Clinic)  Erlinda Hong, PhD., LMFT-A

## 2020-01-22 ENCOUNTER — Encounter: Payer: Self-pay | Admitting: Family Medicine

## 2020-02-02 ENCOUNTER — Ambulatory Visit (INDEPENDENT_AMBULATORY_CARE_PROVIDER_SITE_OTHER): Payer: 59 | Admitting: Psychology

## 2020-02-02 ENCOUNTER — Other Ambulatory Visit: Payer: Self-pay

## 2020-02-02 NOTE — BH Specialist Note (Signed)
Pt rescheduled appt.

## 2020-02-17 ENCOUNTER — Telehealth: Payer: Self-pay | Admitting: Psychology

## 2020-02-17 ENCOUNTER — Ambulatory Visit (INDEPENDENT_AMBULATORY_CARE_PROVIDER_SITE_OTHER): Payer: 59 | Admitting: Psychology

## 2020-02-17 ENCOUNTER — Other Ambulatory Visit: Payer: Self-pay

## 2020-02-17 NOTE — BH Specialist Note (Signed)
Attempted to call pt twice for appt. Left VM

## 2020-02-17 NOTE — Telephone Encounter (Signed)
Pt called to say she would like to discontinue therapy at this time due to her improved syx.

## 2020-03-04 ENCOUNTER — Other Ambulatory Visit: Payer: Self-pay

## 2020-03-08 ENCOUNTER — Encounter: Payer: Self-pay | Admitting: Women's Health

## 2020-03-08 ENCOUNTER — Ambulatory Visit (INDEPENDENT_AMBULATORY_CARE_PROVIDER_SITE_OTHER): Payer: 59 | Admitting: Women's Health

## 2020-03-08 ENCOUNTER — Other Ambulatory Visit: Payer: Self-pay

## 2020-03-08 VITALS — BP 110/78 | Ht 70.0 in | Wt 228.0 lb

## 2020-03-08 DIAGNOSIS — Z01419 Encounter for gynecological examination (general) (routine) without abnormal findings: Secondary | ICD-10-CM | POA: Diagnosis not present

## 2020-03-08 DIAGNOSIS — Z113 Encounter for screening for infections with a predominantly sexual mode of transmission: Secondary | ICD-10-CM

## 2020-03-08 MED ORDER — SERTRALINE HCL 50 MG PO TABS: 50.0000 mg | ORAL_TABLET | Freq: Every day | ORAL | 4 refills | Status: DC

## 2020-03-08 NOTE — Patient Instructions (Signed)
MVI daily Pleasure to know you! Health Maintenance, Female Adopting a healthy lifestyle and getting preventive care are important in promoting health and wellness. Ask your health care provider about:  The right schedule for you to have regular tests and exams.  Things you can do on your own to prevent diseases and keep yourself healthy. What should I know about diet, weight, and exercise? Eat a healthy diet   Eat a diet that includes plenty of vegetables, fruits, low-fat dairy products, and lean protein.  Do not eat a lot of foods that are high in solid fats, added sugars, or sodium. Maintain a healthy weight Body mass index (BMI) is used to identify weight problems. It estimates body fat based on height and weight. Your health care provider can help determine your BMI and help you achieve or maintain a healthy weight. Get regular exercise Get regular exercise. This is one of the most important things you can do for your health. Most adults should:  Exercise for at least 150 minutes each week. The exercise should increase your heart rate and make you sweat (moderate-intensity exercise).  Do strengthening exercises at least twice a week. This is in addition to the moderate-intensity exercise.  Spend less time sitting. Even light physical activity can be beneficial. Watch cholesterol and blood lipids Have your blood tested for lipids and cholesterol at 23 years of age, then have this test every 5 years. Have your cholesterol levels checked more often if:  Your lipid or cholesterol levels are high.  You are older than 23 years of age.  You are at high risk for heart disease. What should I know about cancer screening? Depending on your health history and family history, you may need to have cancer screening at various ages. This may include screening for:  Breast cancer.  Cervical cancer.  Colorectal cancer.  Skin cancer.  Lung cancer. What should I know about heart  disease, diabetes, and high blood pressure? Blood pressure and heart disease  High blood pressure causes heart disease and increases the risk of stroke. This is more likely to develop in people who have high blood pressure readings, are of African descent, or are overweight.  Have your blood pressure checked: ? Every 3-5 years if you are 29-24 years of age. ? Every year if you are 79 years old or older. Diabetes Have regular diabetes screenings. This checks your fasting blood sugar level. Have the screening done:  Once every three years after age 66 if you are at a normal weight and have a low risk for diabetes.  More often and at a younger age if you are overweight or have a high risk for diabetes. What should I know about preventing infection? Hepatitis B If you have a higher risk for hepatitis B, you should be screened for this virus. Talk with your health care provider to find out if you are at risk for hepatitis B infection. Hepatitis C Testing is recommended for:  Everyone born from [birth year] through 1964.  Anyone with known risk factors for hepatitis C. Sexually transmitted infections (STIs)  Get screened for STIs, including gonorrhea and chlamydia, if: ? You are sexually active and are younger than 23 years of age. ? You are older than 23 years of age and your health care provider tells you that you are at risk for this type of infection. ? Your sexual activity has changed since you were last screened, and you are at increased risk for chlamydia or gonorrhea.  Ask your health care provider if you are at risk.  Ask your health care provider about whether you are at high risk for HIV. Your health care provider may recommend a prescription medicine to help prevent HIV infection. If you choose to take medicine to prevent HIV, you should first get tested for HIV. You should then be tested every 3 months for as long as you are taking the medicine. Pregnancy  If you are about to stop  having your period (premenopausal) and you may become pregnant, seek counseling before you get pregnant.  Take 400 to 800 micrograms (mcg) of folic acid every day if you become pregnant.  Ask for birth control (contraception) if you want to prevent pregnancy. Osteoporosis and menopause Osteoporosis is a disease in which the bones lose minerals and strength with aging. This can result in bone fractures. If you are 65 years old or older, or if you are at risk for osteoporosis and fractures, ask your health care provider if you should:  Be screened for bone loss.  Take a calcium or vitamin D supplement to lower your risk of fractures.  Be given hormone replacement therapy (HRT) to treat symptoms of menopause. Follow these instructions at home: Lifestyle  Do not use any products that contain nicotine or tobacco, such as cigarettes, e-cigarettes, and chewing tobacco. If you need help quitting, ask your health care provider.  Do not use street drugs.  Do not share needles.  Ask your health care provider for help if you need support or information about quitting drugs. Alcohol use  Do not drink alcohol if: ? Your health care provider tells you not to drink. ? You are pregnant, may be pregnant, or are planning to become pregnant.  If you drink alcohol: ? Limit how much you use to 0-1 drink a day. ? Limit intake if you are breastfeeding.  Be aware of how much alcohol is in your drink. In the [place] one drink equals one 12 oz bottle of beer (355 mL), one 5 oz glass of wine (148 mL), or one 1 oz glass of hard liquor (44 mL). General instructions  Schedule regular health, dental, and eye exams.  Stay current with your vaccines.  Tell your health care provider if: ? You often feel depressed. ? You have ever been abused or do not feel safe at home. Summary  Adopting a healthy lifestyle and getting preventive care are important in promoting health and wellness.  Follow your health  care provider's instructions about healthy diet, exercising, and getting tested or screened for diseases.  Follow your health care provider's instructions on monitoring your cholesterol and blood pressure. This information is not intended to replace advice given to you by your health care provider. Make sure you discuss any questions you have with your health care provider. Document Revised: 10/23/2018 Document Reviewed: 10/23/2018 Elsevier Patient Education  2020 [employer].

## 2020-03-08 NOTE — Progress Notes (Signed)
lab

## 2020-03-08 NOTE — Progress Notes (Signed)
   Anita [name] August 25, 1996 [id number]   History:  24 y.o. presents for annual exam.  09/2017 Nexplanon mostly monthly cycles.  Gardasil series completed.  Normal Pap history.  New partner.  12/2019 marsupialization with good relief Bartholin cyst.  Gynecologic History Patient's last menstrual period was 02/11/2020. Period Cycle (Days): 28 Period Duration (Days): 5 Period Pattern: Regular Menstrual Flow: Moderate Menstrual Control: Maxi pad, Tampon Dysmenorrhea: (!) Mild Dysmenorrhea Symptoms: Cramping  Past medical history, past surgical history, family history and social history were all reviewed and documented in the EPIC chart.  Graduated from [employer] goal, currently working as a Radio broadcast assistant.  Parents healthy.  ROS:  A ROS was performed and pertinent positives and negatives are included.  Exam:  Vitals:   03/08/20 0933  BP: 110/78  Weight: 228 lb (103.4 kg)  Height: 5\' 10"  (1.778 m)   Body mass index is 32.71 kg/m.  General appearance:  Normal Thyroid:  Symmetrical, normal in size, without palpable masses or nodularity. Respiratory  Auscultation:  Clear without wheezing or rhonchi Cardiovascular  Auscultation:  Regular rate, without rubs, murmurs or gallops  Edema/varicosities:  Not grossly evident Abdominal  Soft,nontender, without masses, guarding or rebound.  Liver/spleen:  No organomegaly noted  Hernia:  None appreciated  Skin  Inspection:  Grossly normal   Breasts: Examined lying and sitting.   Right: Without masses, retractions, discharge or axillary adenopathy.   Left: Without masses, retractions, discharge or axillary adenopathy. Gentitourinary   Inguinal/mons:  Normal without inguinal adenopathy  External genitalia:  Normal  BUS/Urethra/Skene's glands:  Normal  Vagina:  Normal  Cervix:  Normal  Uterus:  normal in size, shape and contour.  Midline and mobile  Adnexa/parametria:     Rt: Without masses or  tenderness.   Lt: Without masses or tenderness.  Anus and perineum: Normal   Assessment/Plan:  24 y.o. SF G0 for annual exam with no complaints of discharge, urinary symptoms, dyspareunia or abdominal pain.  09/2017 Nexplanon mostly monthly cycles 12/2019 marsupialization per Dr. Delilah Shan STD screen Anxiety/depression stable on Zoloft  Plan:  nexplanon due to be removed/replaced 09/2020 with Dr. Delilah Shan.    Zoloft 50 mg p.o. daily prescription, proper use given and reviewed, reviewed importance of self-care, leisure activities.  Local I was in annual breathing out of a full GC/Chlamydia culture pending, declines any blood work.  SBEs, exercise, calcium rich foods, MVI daily encouraged.  Pap normal 2020, new screening guidelines reviewed.   [place], 11:08 AM 03/08/2020

## 2020-03-09 LAB — C. TRACHOMATIS/N. GONORRHOEAE RNA
C. trachomatis RNA, TMA: NOT DETECTED
N. gonorrhoeae RNA, TMA: NOT DETECTED

## 2020-03-16 ENCOUNTER — Ambulatory Visit (INDEPENDENT_AMBULATORY_CARE_PROVIDER_SITE_OTHER): Payer: 59 | Admitting: Obstetrics and Gynecology

## 2020-03-16 ENCOUNTER — Encounter: Payer: Self-pay | Admitting: Obstetrics and Gynecology

## 2020-03-16 ENCOUNTER — Other Ambulatory Visit: Payer: Self-pay

## 2020-03-16 VITALS — BP 124/80

## 2020-03-16 DIAGNOSIS — N907 Vulvar cyst: Secondary | ICD-10-CM

## 2020-03-16 NOTE — Progress Notes (Signed)
Anita [name] [birth year] [id number]  SUBJECTIVE:  24 y.o. G0P0000 female presents for evaluation of a vulvar skin cyst that spontaneously drained.  She had a recurrent left Bartholin gland cyst and ultimately had a marsupialization procedure on 12/31/2019 with good results.  She was recently seen by Elon Alas, NP for routine annual exam on 03/08/2020 and had no complaints or issues at that time.  In the interim since that visit she had developed a cyst along the left labial tissue proximal to where her marsupialization site was, which had apparently filled in and healed since the surgery.  She did show pictures of the cyst she noticed and it appeared translucent and clear, approximately the size of a marble.  She did call in the after-hours service and the on-call provider did have her do sitz baths which ultimately resulted in spontaneous rupture of the cyst with drainage of mucus mixed with some blood but no purulent drainage.  The area has since healed and is no longer painful or cystic.  She has been sexually active since the marsupialization without any difficulty.  Her LMP was 03/10/2020.   Current Outpatient Medications  Medication Sig Dispense Refill  . acetaminophen (TYLENOL) 325 MG tablet Take 2 tablets (650 mg total) by mouth every 6 (six) hours as needed for mild pain.    Marland Kitchen etonogestrel (NEXPLANON) 68 MG IMPL implant 1 each by Subdermal route once.    Marland Kitchen ibuprofen (ADVIL) 600 MG tablet Take 1 tablet (600 mg total) by mouth every 6 (six) hours as needed. 30 tablet 0  . sertraline (ZOLOFT) 50 MG tablet Take 1 tablet (50 mg total) by mouth daily. 90 tablet 4   No current facility-administered medications for this visit.   Allergies: Patient has no known allergies.  Patient's last menstrual period was 03/10/2020.  Past medical history,surgical history, problem list, medications, allergies, family history and social history were all reviewed and documented as reviewed in the EPIC  chart.   OBJECTIVE:  BP 124/80 (BP Location: Right Arm, Patient Position: Sitting, Cuff Size: Normal)   LMP 03/10/2020  The patient appears well, alert, oriented x 3, in no distress. PELVIC EXAM: VULVA: normal appearing vulva with no masses, tenderness, subtle puncture site on left labia majora filling in with scab tissue where the cyst had drained.  The patient was provided with a mirror to show the area where she noticed the cyst, which coincided with the puncture site.  No evidence of the marsupialization dimple that was present after the surgery as this is already healed.  There is no drainage, no fluctuance, no erythema, no pain in the area. VAGINA: normal appearing distal vagina with normal color and discharge, no lesions  Chaperone: KimAlexis Bonham present during the examination  ASSESSMENT:  24 y.o. G0P0000 here for evaluation of a vulvar cyst that has since spontaneously ruptured and healed  PLAN:  Judging from the pictures the patient provided I think the cyst that she noticed on the left labia appeared to be very thin-walled and translucent, therefore more consistent with an inclusion cyst rather than recurrence of a Bartholin gland cyst.  Also favoring this line of thinking is the fact that the sitz baths caused the cyst to rupture which is typically not the case with a Bartholin gland cyst as it is located deeper in the tissue.  We discussed that an inclusion cyst can result from a tissue trauma (such as tampon insertion or intercourse) and the healing process can result in some  fluid accumulation in the subdermal tissues.  At any rate, the lesion has since healed and there is no abnormality noted on the examination today so she will let us know if she has any problems with recurrence of cysts in the vulvar area.  Joseph Pierini MD 03/16/20

## 2020-03-24 ENCOUNTER — Other Ambulatory Visit: Payer: Self-pay

## 2020-03-25 ENCOUNTER — Ambulatory Visit: Payer: 59 | Admitting: Obstetrics & Gynecology

## 2020-03-25 ENCOUNTER — Encounter: Payer: Self-pay | Admitting: Obstetrics & Gynecology

## 2020-03-25 VITALS — BP 130/86

## 2020-03-25 DIAGNOSIS — N907 Vulvar cyst: Secondary | ICD-10-CM | POA: Diagnosis not present

## 2020-03-25 NOTE — Progress Notes (Signed)
    Anita [name] [birth year] [id number]        24 y.o.  G0P0000   RP: H/O Marsupialization Lt Bartholin Gland Cyst with Lt superficial cyst  HPI: Had a left Bartholin gland cyst marsupialization on February 17th 2021 with Dr. Delilah Shan.  Has done very well until now.  Recently had a very thin-walled superficial cyst at the left vulva which developed after intercourse and opened and drained following warm soaking for a few days.  No cyst currently and no pain.  No abnormal drainage.  No abnormal bleeding.  No fever.   OB History  Gravida Para Term Preterm AB Living  0 0 0 0 0 0  SAB TAB Ectopic Multiple Live Births  0 0 0 0 0    Past medical history,surgical history, problem list, medications, allergies, family history and social history were all reviewed and documented in the EPIC chart.   Directed ROS with pertinent positives and negatives documented in the history of present illness/assessment and plan.  Exam:  Vitals:   03/25/20 1437  BP: 130/86   General appearance:  Normal   Gynecologic exam: Vulva:  Left Bartholin Gland Marsupialization in 12/2019.  Opening still opened (per patient, the location of the opening is where the recent superficial cyst was).  No recurrence of Bartholin cyst.  No erythema. NT.   Assessment/Plan:  24 y.o. G0P0000   1. Vulvar cyst History of left Bartholin gland marsupialization December 31, 2019.  No recurrence of Bartholin gland cyst or abscess since then.  Patient reports a small superficial vulvar cyst on the left side which appeared after intercourse.  Warm soaking opened the cyst which drained.  No current cyst or pain.  On exam, the location of that superficial cyst corresponds to that left Bartholin gland opening created by marsupialization.  Patient reassured.  Recommended to continue with warm soaking as needed if the opening were to close.  Princess Bruins MD, 3:08 PM 03/25/2020

## 2020-03-27 ENCOUNTER — Encounter: Payer: Self-pay | Admitting: Obstetrics & Gynecology

## 2020-03-27 NOTE — Patient Instructions (Signed)
1. Vulvar cyst History of left Bartholin gland marsupialization December 31, 2019.  No recurrence of Bartholin gland cyst or abscess since then.  Patient reports a small superficial vulvar cyst on the left side which appeared after intercourse.  Warm soaking opened the cyst which drained.  No current cyst or pain.  On exam, the location of that superficial cyst corresponds to that left Bartholin gland opening created by marsupialization.  Patient reassured.  Recommended to continue with warm soaking as needed if the opening were to close.  Anita [name], it was a pleasure seeing you today!

## 2020-05-05 ENCOUNTER — Other Ambulatory Visit: Payer: Self-pay

## 2020-05-05 ENCOUNTER — Ambulatory Visit: Payer: 59 | Admitting: Obstetrics and Gynecology

## 2020-08-20 ENCOUNTER — Telehealth: Payer: Self-pay

## 2020-08-20 NOTE — Telephone Encounter (Signed)
Patient calls nurse line requesting to speak to Dr. Erin Hearing regarding medication, Zoloft. Patient has questions regarding if she should still be taking. Patient would also like to talk with provider about getting an emotional support animal.   Upon chart review, patient has not been seen in the clinic with PCP in over a year. Scheduled patient for 08/25/20 with PCP to discuss above concerns.   FYI to PCP  Talbot Grumbling, RN

## 2020-08-25 ENCOUNTER — Other Ambulatory Visit: Payer: Self-pay

## 2020-08-25 ENCOUNTER — Ambulatory Visit (INDEPENDENT_AMBULATORY_CARE_PROVIDER_SITE_OTHER): Payer: 59 | Admitting: Family Medicine

## 2020-08-25 DIAGNOSIS — G43009 Migraine without aura, not intractable, without status migrainosus: Secondary | ICD-10-CM | POA: Diagnosis not present

## 2020-08-25 DIAGNOSIS — F32A Depression, unspecified: Secondary | ICD-10-CM

## 2020-08-25 DIAGNOSIS — Z23 Encounter for immunization: Secondary | ICD-10-CM | POA: Diagnosis not present

## 2020-08-25 DIAGNOSIS — R635 Abnormal weight gain: Secondary | ICD-10-CM | POA: Diagnosis not present

## 2020-08-25 NOTE — Patient Instructions (Addendum)
Good to see you today!  Thanks for coming in.  Weight loss  Aim to lose 2 lb a week   Strength training does not loose weight  Choose a diet that you can stick with for 30 weeks  Discuss with therapist approaches when you want to go off the diet  For the zoloft  Cut pill in half for two weeks then take every other day for one week then stop   For the sex drive - see how does with the above  For the headache -once off zoloft for 2 weeks keep a headache diary with type and symptoms of headache and what you take if anything  If need a letter for [facility] then let me know  Come back - 4-6 weeks

## 2020-08-25 NOTE — Assessment & Plan Note (Signed)
Improved.  Will wean zoloft - see after visit summary

## 2020-08-25 NOTE — Assessment & Plan Note (Signed)
See after visit summary.   Will see how responds to more regular exercise, diet and coming off zoloft

## 2020-08-25 NOTE — Progress Notes (Signed)
    SUBJECTIVE:   CHIEF COMPLAINT / HPI:   DEPRESSION Feels is improving.  Would like to come off zoloft.  Seeing [facility] counselor regularly Jose Persia.   Feels her pitt bull Cleon Gustin is very therapeutic and would like her to be a therapy dog   WEIGHT GAIN Has gained about 40 lbs in last 2 years.  Exercising fairly regularly and trying to stick to diet but frequently deviates.  HEADACHEs Feels her migraines are worse.  Had these as a child.  Now are not so severe but more frequent.  Rarely has nausea and vomiting.  Takes ibuprofen infrequently.  No visual loss or CVA symptoms   PERTINENT  PMH / PSH: has nexplanon   OBJECTIVE:   There were no vitals taken for this visit.  Psych:  Cognition and judgment appear intact. Alert, communicative  and cooperative with normal attention span and concentration. No apparent delusions, illusions, hallucinations PHQ9 = 4  ASSESSMENT/PLAN:   Migraine without aura Unsure if her current headaches are migraines.  Taper off zoloft and keep diary and return if persists  Depression Improved.  Will wean zoloft - see after visit summary   Weight gain See after visit summary.   Will see how responds to more regular exercise, diet and coming off [place], MD [place]

## 2020-08-25 NOTE — Assessment & Plan Note (Signed)
Unsure if her current headaches are migraines.  Taper off zoloft and keep diary and return if persists

## 2020-09-07 ENCOUNTER — Encounter: Payer: Self-pay | Admitting: Family Medicine

## 2020-09-09 ENCOUNTER — Encounter: Payer: Self-pay | Admitting: Family Medicine

## 2020-10-01 ENCOUNTER — Telehealth: Payer: Self-pay

## 2020-10-01 NOTE — Telephone Encounter (Signed)
Patient calls nurse line regarding intermittent dizziness. Patient reports that she has been taking magnesium citrate 100 mg, because she read it was good for gut health. Patient states that she did not take today and noticed improvement in dizzy spells.   Instructed patient to hold magnesium citrate over the weekend, drink plenty of fluids and change positions slowly. Provided with ED precautions and instructed patient to return call to office Monday if dizzy spells continue to persist.   Patient denies current [facility], chest pain, arm pain or headache.   FYI to PCP  Talbot Grumbling, RN

## 2020-10-04 ENCOUNTER — Ambulatory Visit (INDEPENDENT_AMBULATORY_CARE_PROVIDER_SITE_OTHER): Payer: 59 | Admitting: Obstetrics and Gynecology

## 2020-10-04 ENCOUNTER — Encounter: Payer: Self-pay | Admitting: Obstetrics and Gynecology

## 2020-10-04 ENCOUNTER — Other Ambulatory Visit: Payer: Self-pay

## 2020-10-04 VITALS — BP 120/74

## 2020-10-04 DIAGNOSIS — Z3046 Encounter for surveillance of implantable subdermal contraceptive: Secondary | ICD-10-CM

## 2020-10-04 MED ORDER — LEVONORGESTREL-ETHINYL ESTRAD 0.1-20 MG-MCG PO TABS: 1.0000 | ORAL_TABLET | Freq: Every day | ORAL | 4 refills | Status: DC

## 2020-10-04 NOTE — Progress Notes (Signed)
   Anita [name] 24-Mar-1996 [id number]  SUBJECTIVE:  24 y.o. G0P0000 female presents for Nexplanon removal, it was placed 09/2017 so it is due for removal.  He does get regular menses with the Nexplanon in.  She would like to start oral contraceptive pills.  Current Outpatient Medications  Medication Sig Dispense Refill  . acetaminophen (TYLENOL) 325 MG tablet Take 2 tablets (650 mg total) by mouth every 6 (six) hours as needed for mild pain.    Marland Kitchen ibuprofen (ADVIL) 600 MG tablet Take 1 tablet (600 mg total) by mouth every 6 (six) hours as needed. 30 tablet 0  . levonorgestrel-ethinyl estradiol (AVIANE) 0.1-20 MG-MCG tablet Take 1 tablet by mouth daily. May start first Sunday after your period (or on Sunday if that is when your period is starting), use backup contraception for 7 days after starting the pill before relying on it for pregnancy prevention 84 tablet 4   No current facility-administered medications for this visit.   Allergies: Patient has no known allergies.  Patient's last menstrual period was 09/12/2020.  Past medical history,surgical history, problem list, medications, allergies, family history and social history were all reviewed and documented as reviewed in the EPIC chart.  OBJECTIVE:  BP 120/74   LMP 09/12/2020  The patient appears well, alert, oriented, in no distress.  Nexplanon removal procedure note  The planned Nexplanon device was palpated in the patient's left arm. The site was cleansed with Betadine swab.  1.5 mL of 1% lidocaine was injected just beneath the skin where the skin incision was to be made over the old insertion site scar.   A change to sterile gloves was made.  Adequate anesthesia was confirmed.  A small nick was made in the skin with an 11 blade scalpel.  The Nexplanon device had a scar capsule around it which was bluntly dissected with the mosquito forceps.  Applying pressure to the arm presented the end of the Nexplanon device through the skin  incision.  The device was grasped and gentle traction on the device resulted in its removal in its entirety as visually confirmed by the assistant.  The site was then dressed in a normal fashion.  The patient tolerated the procedure well.  Caryn Bee was present during the procedure.  ASSESSMENT:  24 y.o. G0P0000 here for Nexplanon removal  PLAN:  Nexplanon is removed without difficulty today. I will start her on Avianne equivalent (levonorgestrel-EE 0.1-20) and she can start the Sunday method.  Birth control use and side effects reviewed including possibility for abnormal uterine bleeding patterns in the first several months of use typically get better with continued use.  If sexually active I recommend backup contraception for 7 days before relying on the pill for contraceptive effects.   Joseph Pierini MD 10/04/20

## 2020-10-04 NOTE — Telephone Encounter (Signed)
Patient calls nurse line reporting continued dizziness. Patient reports she stopped taking magnesium over the weekend, however symptoms have not subsided. Patient denies SOB or loss of consciousness. Patient encouraged to drink plenty of fluids and apt scheduled for Wednesday.

## 2020-10-06 ENCOUNTER — Other Ambulatory Visit: Payer: Self-pay

## 2020-10-06 ENCOUNTER — Ambulatory Visit (INDEPENDENT_AMBULATORY_CARE_PROVIDER_SITE_OTHER): Payer: 59 | Admitting: Family Medicine

## 2020-10-06 VITALS — BP 104/60 | HR 79 | Ht 70.0 in | Wt 249.4 lb

## 2020-10-06 DIAGNOSIS — R42 Dizziness and giddiness: Secondary | ICD-10-CM

## 2020-10-06 MED ORDER — MECLIZINE HCL 12.5 MG PO TABS: 12.5000 mg | ORAL_TABLET | Freq: Three times a day (TID) | ORAL | 0 refills | Status: DC | PRN

## 2020-10-06 NOTE — Progress Notes (Signed)
    SUBJECTIVE:   CHIEF COMPLAINT / HPI:   Dizziness Patient called nurses line on 11/19 reporting intermittent dizzy spells Had been taking magnesium citrate at that time and was having improvement with stopping Over the weekend she continued to have intermittent dizzy spells Had migraine 2 days ago and it was worse Also worse after exercising yesterday Only lasts a few seconds It is just a "head dizziness, " states "I don't feel it in the rest of my body" If she gets up fast she feels dizzy and if she moves her eyes back and forth quickly she feels dizzy Noticed it when she started taking zoloft cold-turkey Started taking again, then weaned off, then the dizzy spells came back Sometimes gets dizziness when she turns her head in bed  Quick movements of her head make her dizzy No nausea or vomiting other than 2 days ago when she had her migraine Has been trying to drink a lot of water, drinking about 64 oz or more in a day Before this started, was in normal state of health, no fevers Headaches haven't been worsening No recent changes in hearing or cold-like symptoms No heart palpitations, chest pain, shortness of breath No specific worsening in her mood Feels a lot of pressure in her eyes Just had nexplanon removed, but no unprotected intercourse since then  PERTINENT  PMH / PSH: Migraine, depression  OBJECTIVE:   BP 104/60   Pulse 79   Ht 5\' 10"  (1.778 m)   Wt 249 lb 6.4 oz (113.1 kg)   LMP 09/12/2020   SpO2 99%   BMI 35.79 kg/m    Physical Exam:  General: 24 y.o. female in NAD HEENT: NCAT, EOMI, PERRL, no nystagmus, TMs clear b/l, dizziness with Marye Round to left without nystagmus Cardio: RRR no m/r/g Lungs: CTAB, no wheezing, no rhonchi, no crackles, no IWOB on RA Skin: warm and dry Extremities: No edema 5/5 strength BUE/BLE Neuro: CN II through XII grossly intact, sensation intact throughout  Orthostatic VS for the past 24 hrs:  BP- Lying Pulse- Lying BP-  Sitting Pulse- Sitting BP- Standing at 0 minutes Pulse- Standing at 0 minutes  10/06/20 1457 94/58 81 110/60 83 92/56 76      ASSESSMENT/PLAN:   Dizziness Precepted with Dr. Ardelia Mems.  Patient's cause of dizziness is unclear, does not seem to be cardiac.  Very nonspecific.  Is not clearly related to inner ear, not clearly vertigo.  Does have a positive Dix-Hallpike on the left with symptoms, but does not have an nystagmus.  Neurologically intact on exam which is reassuring.  Orthostatic vital signs were not positive and she reports good hydration.  Could also consider Zoloft discontinuation as the cause, although this is less likely.  Will prescribe meclizine to use as needed and have her follow-up in 2 weeks to see if she has had any improvement in her symptoms.  Could consider vestibular PT in the future if it appears to be more vertigo.     Cleophas Dunker, [place]

## 2020-10-06 NOTE — Patient Instructions (Addendum)
Thank you for coming to see me today. It was a pleasure. Today we talked about:   Your dizziness could be coming from an inner ear problem, it is hard to sort out.  It does not seem to be caused by anything bad at this time.  Continue to stay well-hydrated.  I have sent a prescription to the pharmacy for you to take as needed for dizziness.  If you have worsening in your symptoms, please come back right away.  Keep a diary of when this occurs and how long it occurs.  Please follow-up with [place] in 2 weeks.  If you have any questions or concerns, please do not hesitate to call the office at [phone].  Best,   Ari[place] Constable, DO

## 2020-10-06 NOTE — Assessment & Plan Note (Addendum)
Precepted with Dr. Ardelia Mems.  Patient's cause of dizziness is unclear, does not seem to be cardiac.  Very nonspecific.  Is not clearly related to inner ear, not clearly vertigo.  Does have a positive Dix-Hallpike on the left with symptoms, but does not have an nystagmus.  Neurologically intact on exam which is reassuring.  Orthostatic vital signs were not positive and she reports good hydration.  Could also consider Zoloft discontinuation as the cause, although this is less likely.  Will prescribe meclizine to use as needed and have her follow-up in 2 weeks to see if she has had any improvement in her symptoms.  Could consider vestibular PT in the future if it appears to be more vertigo.

## 2020-10-21 ENCOUNTER — Encounter: Payer: Self-pay | Admitting: Obstetrics and Gynecology

## 2021-02-09 ENCOUNTER — Ambulatory Visit: Payer: 59 | Admitting: Family Medicine

## 2021-02-15 ENCOUNTER — Other Ambulatory Visit: Payer: Self-pay

## 2021-02-15 ENCOUNTER — Ambulatory Visit (INDEPENDENT_AMBULATORY_CARE_PROVIDER_SITE_OTHER): Payer: 59 | Admitting: Family Medicine

## 2021-02-15 ENCOUNTER — Encounter: Payer: Self-pay | Admitting: Family Medicine

## 2021-02-15 DIAGNOSIS — R635 Abnormal weight gain: Secondary | ICD-10-CM | POA: Diagnosis not present

## 2021-02-15 DIAGNOSIS — R6882 Decreased libido: Secondary | ICD-10-CM | POA: Insufficient documentation

## 2021-02-15 DIAGNOSIS — R42 Dizziness and giddiness: Secondary | ICD-10-CM

## 2021-02-15 MED ORDER — BUSPIRONE HCL 5 MG PO TABS: 5.0000 mg | ORAL_TABLET | Freq: Two times a day (BID) | ORAL | 1 refills | Status: DC

## 2021-02-15 NOTE — Progress Notes (Signed)
    SUBJECTIVE:   CHIEF COMPLAINT / HPI:   Decreased sexual desire Since was on sertraline last used in [place].  She is not sure if specifically her current partner or more generalized but thinks is more generralized.  Only medication is BCP formerly on depo.  Does not feel these are the cause.  Has regular menstrual periods.  No fever or fatigue or change in hair.  Is losing weight since on a new dietd  Depression Feels well.  PHQ9 - 4  No current medications  Dizziness  much improved.  Never took meclizine   OBJECTIVE:   BP 108/62   Pulse 75   Wt 225 lb 9.6 oz (102.3 kg)   SpO2 99%   BMI 32.37 kg/m   Psych:  Cognition and judgment appear intact. Alert, communicative  and cooperative with normal attention span and concentration. No apparent delusions, illusions, hallucinations   ASSESSMENT/PLAN:   Depression Improved.    Dizziness Resolved   Weight gain Much improved.  Has lost 25 lbs.  Is on diet and taking a supplement (see media)   Decreased libido No definite cause.  No signs of endocrine disorder or systemic disease.  Some data on prolonged loss of libido after SSRI responding to buspar.  Discussed with her and she would like to try.  See after visit summary for plan      Lind Covert, [place]

## 2021-02-15 NOTE — Assessment & Plan Note (Signed)
Resolved

## 2021-02-15 NOTE — Assessment & Plan Note (Signed)
Much improved.  Has lost 25 lbs.  Is on diet and taking a supplement (see media)

## 2021-02-15 NOTE — Assessment & Plan Note (Signed)
No definite cause.  No signs of endocrine disorder or systemic disease.  Some data on prolonged loss of libido after SSRI responding to buspar.  Discussed with her and she would like to try.  See after visit summary for plan

## 2021-02-15 NOTE — Assessment & Plan Note (Signed)
Improved

## 2021-02-15 NOTE — Patient Instructions (Signed)
Good to see you today - Thank you for coming in  Things we discussed today:  Try the buspar 5 mg twice a day for at least 3-4 weeks    You should exercise at least 20 minutes every day.    Choose something you like the most or hate the least.   Having a set time every day and having a partner will help you stick to it.    If you are too tired try to do at least 5 minutes, it often gets easier.  Please always bring your medication bottles  Come back to see me in 2 months

## 2021-02-22 ENCOUNTER — Other Ambulatory Visit: Payer: Self-pay | Admitting: Family Medicine

## 2021-02-25 ENCOUNTER — Encounter: Payer: Self-pay | Admitting: Family Medicine

## 2021-02-28 ENCOUNTER — Ambulatory Visit (INDEPENDENT_AMBULATORY_CARE_PROVIDER_SITE_OTHER): Payer: 59 | Admitting: Family Medicine

## 2021-02-28 VITALS — BP 127/75 | HR 75 | Temp 98.6°F | Ht 70.0 in

## 2021-02-28 DIAGNOSIS — J069 Acute upper respiratory infection, unspecified: Secondary | ICD-10-CM | POA: Diagnosis not present

## 2021-02-28 DIAGNOSIS — Z111 Encounter for screening for respiratory tuberculosis: Secondary | ICD-10-CM | POA: Diagnosis not present

## 2021-02-28 NOTE — Assessment & Plan Note (Addendum)
Lungs clear, no fevers, well-hydrated on exam.  VSS.  Discussed with patient that symptoms are most likely viral in origin.  Discussed supportive care including nasal saline, Vaseline for nose irritation, humidifier, honey in a warm liquid for cough.  Can also use Breathe Right strips for congestion at night.  Discussed with her that tomorrow would be day 5 of her quarantine.  From Covid, therefore would not repeat testing.  Will write her out for work tomorrow.  She can return on 4/20 as long as she is improving.  Advised her that if she is not improving by that time, to send a message and consider an antibiotic for her, as is likely bacterial if not starting to improve by that time.  She voiced understanding.  Return to care if no improvement, if significant worsening in her symptoms and difficulty breathing, should be seen right away.

## 2021-02-28 NOTE — Patient Instructions (Signed)
Thank you for coming to see me today. It was a pleasure. Today we talked about:   Your symptoms are likely caused by a viral upper respiratory infection which usually last about 5 days.   If after five days, you are without symptoms or have only minimal symptoms, you can go back to school/work, but MUST wear a mask.  COVID guidelines are changing rapidly, so I recommended checking the [facility] website as well.  You can also try nasal saline for congestion, vaseline for nose irritation, and a humidifer in their room at night.  Avoid over the counter cough/cold medications.  You can also use tylenol or motrin.  Let me know by Wednesday if you aren't better and I can send you an antibiotic.   If you have any questions or concerns, please do not hesitate to call the office at [phone].  Best,   [place] Constable, DO

## 2021-02-28 NOTE — Progress Notes (Signed)
    SUBJECTIVE:   CHIEF COMPLAINT / HPI:   Cough Started 4/14 No fevers  Endorses congestion, rhinorrhea, throat irritation, sinus pressure Denies headache, fatigue, myalgias, nausea, vomiting, diarrhea Has been drinking normally, has normal UOP  No known sick contacts  Has done a home COVID test 2 days ago, was negative  TB testing She is starting a new job on Monday 4/25 and needs a TB test  PERTINENT  PMH / PSH: migraine, depression  OBJECTIVE:   BP 127/75   Pulse 75   Temp 98.6 F (37 C) (Oral)   Ht 5\' 10"  (1.778 m)   SpO2 99%   BMI 32.37 kg/m    Physical Exam:  General: 25 y.o. female in NAD HEENT: NCAT, MMM, throat clear with cobblestoning, swollen nasal turbinates b/l with rhinorrhea Neck: supple, no cervical LAD Cardio: RRR no m/r/g Lungs: CTAB, no wheezing, no rhonchi, no crackles, no IWOB on RA Skin: warm and dry Extremities: No edema   ASSESSMENT/PLAN:   Viral upper respiratory illness Lungs clear, no fevers, well-hydrated on exam.  VSS.  Discussed with patient that symptoms are most likely viral in origin.  Discussed supportive care including nasal saline, Vaseline for nose irritation, humidifier, honey in a warm liquid for cough.  Can also use Breathe Right strips for congestion at night.  Discussed with her that tomorrow would be day 5 of her quarantine.  From Covid, therefore would not repeat testing.  Will write her out for work tomorrow.  She can return on 4/20 as long as she is improving.  Advised her that if she is not improving by that time, to send a message and consider an antibiotic for her, as is likely bacterial if not starting to improve by that time.  She voiced understanding.  Return to care if no improvement, if significant worsening in her symptoms and difficulty breathing, should be seen right away.  Screening for tuberculosis Given that patient needs a PPD screening and is in the middle of quarantine for possible Covid, will obtain a  quant gold for her screening for her new job to avoid further exposure in the office.     Cleophas Dunker, [place]

## 2021-02-28 NOTE — Assessment & Plan Note (Signed)
Given that patient needs a PPD screening and is in the middle of quarantine for possible Covid, will obtain a quant gold for her screening for her new job to avoid further exposure in the office.

## 2021-03-03 LAB — QUANTIFERON-TB GOLD PLUS
QuantiFERON Mitogen Value: >10 IU/mL
QuantiFERON Nil Value: 0.16 IU/mL
QuantiFERON TB1 Ag Value: 0.18 IU/mL
QuantiFERON TB2 Ag Value: 0.17 IU/mL
QuantiFERON-TB Gold Plus: NEGATIVE

## 2021-03-08 ENCOUNTER — Ambulatory Visit: Payer: 59 | Admitting: Family Medicine

## 2021-03-10 ENCOUNTER — Ambulatory Visit (INDEPENDENT_AMBULATORY_CARE_PROVIDER_SITE_OTHER): Payer: 59 | Admitting: Nurse Practitioner

## 2021-03-10 ENCOUNTER — Encounter: Payer: Self-pay | Admitting: Nurse Practitioner

## 2021-03-10 ENCOUNTER — Other Ambulatory Visit: Payer: Self-pay

## 2021-03-10 VITALS — BP 116/70 | Ht 69.0 in | Wt 222.0 lb

## 2021-03-10 DIAGNOSIS — Z3041 Encounter for surveillance of contraceptive pills: Secondary | ICD-10-CM | POA: Diagnosis not present

## 2021-03-10 DIAGNOSIS — Z01419 Encounter for gynecological examination (general) (routine) without abnormal findings: Secondary | ICD-10-CM | POA: Diagnosis not present

## 2021-03-10 DIAGNOSIS — Z113 Encounter for screening for infections with a predominantly sexual mode of transmission: Secondary | ICD-10-CM

## 2021-03-10 MED ORDER — LEVONORGESTREL-ETHINYL ESTRAD 0.1-20 MG-MCG PO TABS: 1.0000 | ORAL_TABLET | Freq: Every day | ORAL | 3 refills | Status: DC

## 2021-03-10 NOTE — Progress Notes (Signed)
   Anita [name] 27-Oct-1996 [id number]   History:  25 y.o. G0 presents for annual exam. Monthly cycle/OCPs. Received Gardasil series. Normal pap history. 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 Since then she notices swelling after about 3 episodes of intercourse but will drain on its own. She has been doing warm baths as recommended. Sexually active with boyfriend.   Gynecologic History Patient's last menstrual period was 02/28/2021. Period Cycle (Days): 28 Period Duration (Days): 5 Period Pattern: Regular Menstrual Flow: Moderate Dysmenorrhea: None Contraception/Family planning: OCP (estrogen/progesterone)  Health Maintenance Last Pap: 03/05/2019. Results were: normal Last mammogram: N/A Last colonoscopy: N/A Last Dexa: N/A  Past medical history, past surgical history, family history and social history were all reviewed and documented in the EPIC chart. Contract paralegal, part time at a daycare. Starts masters program 06/2021 for social work.   ROS:  A ROS was performed and pertinent positives and negatives are included.  Exam:  Vitals:   03/10/21 1027  BP: 116/70  Weight: 222 lb (100.7 kg)  Height: 5\' 9"  (1.753 m)   Body mass index is 32.78 kg/m.  General appearance:  Normal Thyroid:  Symmetrical, normal in size, without palpable masses or nodularity. Respiratory  Auscultation:  Clear without wheezing or rhonchi Cardiovascular  Auscultation:  Regular rate, without rubs, murmurs or gallops  Edema/varicosities:  Not grossly evident Abdominal  Soft,nontender, without masses, guarding or rebound.  Liver/spleen:  No organomegaly noted  Hernia:  None appreciated  Skin  Inspection:  Grossly normal Breasts: Examined lying and sitting.   Right: Without masses, retractions, nipple discharge or axillary adenopathy.   Left: Without masses, retractions, nipple discharge or axillary adenopathy. Gentitourinary   Inguinal/mons:  Normal without inguinal  adenopathy  External genitalia:  Normal appearing vulva with no masses, tenderness, or lesions. No evidence of bartholin cyst  BUS/Urethra/Skene's glands:  Normal  Vagina:  Normal appearing with normal color and discharge, no lesions  Cervix:  Normal appearing without discharge or lesions  Uterus:  Normal in size, shape and contour.  Midline and mobile, nontender  Adnexa/parametria:     Rt: Normal in size, without masses or tenderness.   Lt: Normal in size, without masses or tenderness.  Anus and perineum: Normal  Assessment/Plan:  25 y.o. G0 for annual exam.   Well female exam with routine gynecological exam - Education provided on SBEs, importance of preventative screenings, current guidelines, high calcium diet, regular exercise, and multivitamin daily.  Encounter for surveillance of contraceptive pills - Plan: levonorgestrel-ethinyl estradiol (AVIANE) 0.1-20 MG-MCG tablet daily. Taking as prescribed. Refill x 1 year provided.   History of left bartholin cyst - 12/2019 marsupialization. Since then she notices swelling after about 3 episodes of intercourse but it will drain on its own. She has been doing warm baths as recommended. If this continues and is bothersome it is recommended she return when it is enlarged to discuss another marsupialization versus word catheter.   Screen for STD (sexually transmitted disease) - Plan: SURESWAB CT/NG/T. vaginalis  Screening for cervical cancer - normal pap history. Will repeat at 3-year interval per guidelines.   Return in 1 year for annual.    Tamela Gammon DNP, 10:38 AM 03/10/2021

## 2021-03-10 NOTE — Patient Instructions (Signed)
Health Maintenance, Female Adopting a healthy lifestyle and getting preventive care are important in promoting health and wellness. Ask your health care provider about:  The right schedule for you to have regular tests and exams.  Things you can do on your own to prevent diseases and keep yourself healthy. What should I know about diet, weight, and exercise? Eat a healthy diet  Eat a diet that includes plenty of vegetables, fruits, low-fat dairy products, and lean protein.  Do not eat a lot of foods that are high in solid fats, added sugars, or sodium.   Maintain a healthy weight Body mass index (BMI) is used to identify weight problems. It estimates body fat based on height and weight. Your health care provider can help determine your BMI and help you achieve or maintain a healthy weight. Get regular exercise Get regular exercise. This is one of the most important things you can do for your health. Most adults should:  Exercise for at least 150 minutes each week. The exercise should increase your heart rate and make you sweat (moderate-intensity exercise).  Do strengthening exercises at least twice a week. This is in addition to the moderate-intensity exercise.  Spend less time sitting. Even light physical activity can be beneficial. Watch cholesterol and blood lipids Have your blood tested for lipids and cholesterol at 24 years of age, then have this test every 5 years. Have your cholesterol levels checked more often if:  Your lipid or cholesterol levels are high.  You are older than 24 years of age.  You are at high risk for heart disease. What should I know about cancer screening? Depending on your health history and family history, you may need to have cancer screening at various ages. This may include screening for:  Breast cancer.  Cervical cancer.  Colorectal cancer.  Skin cancer.  Lung cancer. What should I know about heart disease, diabetes, and high blood  pressure? Blood pressure and heart disease  High blood pressure causes heart disease and increases the risk of stroke. This is more likely to develop in people who have high blood pressure readings, are of African descent, or are overweight.  Have your blood pressure checked: ? Every 3-5 years if you are 18-39 years of age. ? Every year if you are 40 years old or older. Diabetes Have regular diabetes screenings. This checks your fasting blood sugar level. Have the screening done:  Once every three years after age 40 if you are at a normal weight and have a low risk for diabetes.  More often and at a younger age if you are overweight or have a high risk for diabetes. What should I know about preventing infection? Hepatitis B If you have a higher risk for hepatitis B, you should be screened for this virus. Talk with your health care provider to find out if you are at risk for hepatitis B infection. Hepatitis C Testing is recommended for:  Everyone born from 1945 through 1965.  Anyone with known risk factors for hepatitis C. Sexually transmitted infections (STIs)  Get screened for STIs, including gonorrhea and chlamydia, if: ? You are sexually active and are younger than 24 years of age. ? You are older than 24 years of age and your health care provider tells you that you are at risk for this type of infection. ? Your sexual activity has changed since you were last screened, and you are at increased risk for chlamydia or gonorrhea. Ask your health care provider   if you are at risk.  Ask your health care provider about whether you are at high risk for HIV. Your health care provider may recommend a prescription medicine to help prevent HIV infection. If you choose to take medicine to prevent HIV, you should first get tested for HIV. You should then be tested every 3 months for as long as you are taking the medicine. Pregnancy  If you are about to stop having your period (premenopausal) and  you may become pregnant, seek counseling before you get pregnant.  Take 400 to 800 micrograms (mcg) of folic acid every day if you become pregnant.  Ask for birth control (contraception) if you want to prevent pregnancy. Osteoporosis and menopause Osteoporosis is a disease in which the bones lose minerals and strength with aging. This can result in bone fractures. If you are 65 years old or older, or if you are at risk for osteoporosis and fractures, ask your health care provider if you should:  Be screened for bone loss.  Take a calcium or vitamin D supplement to lower your risk of fractures.  Be given hormone replacement therapy (HRT) to treat symptoms of menopause. Follow these instructions at home: Lifestyle  Do not use any products that contain nicotine or tobacco, such as cigarettes, e-cigarettes, and chewing tobacco. If you need help quitting, ask your health care provider.  Do not use street drugs.  Do not share needles.  Ask your health care provider for help if you need support or information about quitting drugs. Alcohol use  Do not drink alcohol if: ? Your health care provider tells you not to drink. ? You are pregnant, may be pregnant, or are planning to become pregnant.  If you drink alcohol: ? Limit how much you use to 0-1 drink a day. ? Limit intake if you are breastfeeding.  Be aware of how much alcohol is in your drink. In the [place] one drink equals one 12 oz bottle of beer (355 mL), one 5 oz glass of wine (148 mL), or one 1 oz glass of hard liquor (44 mL). General instructions  Schedule regular health, dental, and eye exams.  Stay current with your vaccines.  Tell your health care provider if: ? You often feel depressed. ? You have ever been abused or do not feel safe at home. Summary  Adopting a healthy lifestyle and getting preventive care are important in promoting health and wellness.  Follow your health care provider's instructions about healthy  diet, exercising, and getting tested or screened for diseases.  Follow your health care provider's instructions on monitoring your cholesterol and blood pressure. This information is not intended to replace advice given to you by your health care provider. Make sure you discuss any questions you have with your health care provider. Document Revised: 10/23/2018 Document Reviewed: 10/23/2018 Elsevier Patient Education  2021 Elsevier Inc.  

## 2021-03-11 LAB — SURESWAB CT/NG/T. VAGINALIS
C. trachomatis RNA, TMA: NOT DETECTED
N. gonorrhoeae RNA, TMA: NOT DETECTED
Trichomonas vaginalis RNA: NOT DETECTED

## 2021-03-29 ENCOUNTER — Other Ambulatory Visit: Payer: Self-pay | Admitting: Family Medicine

## 2021-04-15 ENCOUNTER — Telehealth: Payer: Self-pay | Admitting: Family Medicine

## 2021-04-15 NOTE — Telephone Encounter (Signed)
Patient emailed form requesting immunization record  Please print her Immunization Record and place in my box   Thanks  Truman Hayward

## 2021-04-15 NOTE — Telephone Encounter (Signed)
Printed and placed Immunization records in [employer] box for distribution.  Anita [name], [place]

## 2021-04-18 ENCOUNTER — Encounter: Payer: Self-pay | Admitting: Family Medicine

## 2021-05-03 ENCOUNTER — Encounter: Payer: Self-pay | Admitting: Family Medicine

## 2021-05-03 ENCOUNTER — Other Ambulatory Visit: Payer: Self-pay

## 2021-05-03 ENCOUNTER — Ambulatory Visit (INDEPENDENT_AMBULATORY_CARE_PROVIDER_SITE_OTHER): Payer: 59 | Admitting: Family Medicine

## 2021-05-03 DIAGNOSIS — F32A Depression, unspecified: Secondary | ICD-10-CM | POA: Diagnosis not present

## 2021-05-03 DIAGNOSIS — R635 Abnormal weight gain: Secondary | ICD-10-CM

## 2021-05-03 DIAGNOSIS — M25561 Pain in right knee: Secondary | ICD-10-CM

## 2021-05-03 DIAGNOSIS — D1724 Benign lipomatous neoplasm of skin and subcutaneous tissue of left leg: Secondary | ICD-10-CM

## 2021-05-03 DIAGNOSIS — R6882 Decreased libido: Secondary | ICD-10-CM | POA: Diagnosis not present

## 2021-05-03 DIAGNOSIS — D179 Benign lipomatous neoplasm, unspecified: Secondary | ICD-10-CM | POA: Insufficient documentation

## 2021-05-03 HISTORY — DX: Benign lipomatous neoplasm, unspecified: D17.9

## 2021-05-03 MED ORDER — BUSPIRONE HCL 10 MG PO TABS: 10.0000 mg | ORAL_TABLET | Freq: Two times a day (BID) | ORAL | 2 refills | Status: DC

## 2021-05-03 NOTE — Patient Instructions (Signed)
Good to see you today - Thank you for coming in  Things we discussed today:  Increase buspar to 10 mg twice a day Can go up to 20 mg twice a day.  Please always bring your medication bottles

## 2021-05-03 NOTE — Assessment & Plan Note (Signed)
By clinical exam.  Will monitor

## 2021-05-03 NOTE — Assessment & Plan Note (Signed)
Improving with weight loss clinic (patches and vitamins)  Plans to stop and continue diet and exercise

## 2021-05-03 NOTE — Assessment & Plan Note (Signed)
Doing well 

## 2021-05-03 NOTE — Progress Notes (Signed)
    SUBJECTIVE:   CHIEF COMPLAINT / HPI:   Libido Feels is better but not back to normal.  Thinks buspar helping some.  Would like to increase dose.  Has trouble remembering the evening dose.  Discussed strategies   Depression Overall doing well.  Occaisional anxiety about her future   PERTINENT  PMH / PSH: Starts [employer] in Fall.  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 Alert, communicative  and cooperative with normal attention span and concentration. No apparent delusions, illusions, hallucinations  L anterior thigh - soft vague mass 2-3 cm  below dermis consistent with lipoma  ASSESSMENT/PLAN:   Decreased libido Improved but not back to normal. Will increase buspar to 10 mg twice a day   Depression Doing well   Weight gain Improving with weight loss clinic (patches and vitamins)  Plans to stop and continue diet and exercise   Lipoma By clinical exam.  Will monitor        Lind Covert, MD [place]

## 2021-05-03 NOTE — Assessment & Plan Note (Addendum)
Improved but not back to normal. Will increase buspar to 10 mg twice a day

## 2021-05-25 ENCOUNTER — Other Ambulatory Visit: Payer: Self-pay | Admitting: Family Medicine

## 2021-07-11 ENCOUNTER — Telehealth: Payer: Self-pay
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
## 2021-07-19 NOTE — Progress Notes (Signed)
    SUBJECTIVE:   CHIEF COMPLAINT / HPI:   F/u Anxiety  Patient presents today to discuss her anxiety.  States that she called the clinic initially for an appointment to discuss night terrors but has not had any recurrence of this since calling.  She recalls having a very stressful day the day of the night terrors and attributes it to this.  She recently started grad school for social work and has had some increased stress and anxiety.  Notes that her anxiety is often related to social situations and test/school performance.  She was previously on sertraline but did not like the side effect profile with her decreased libido.She is currently taking buspirone but would like to wean off of this. She has noted only slight improvement of her libido on this medication.  She would be interested in trying something she can use as needed without much side effect profiles.  Denies SI and HI.  She is currently seeing a therapist.  History of Migraine Would like refill of her ibuprofen.  Takes this as needed for her migraines and has relief.  PERTINENT  PMH / PSH:  Past Medical History:  Diagnosis Date   Chlamydia infection    Migraines     OBJECTIVE:   BP 124/80   Pulse 80   Ht '5\' 9"'$  (1.753 m)   Wt 223 lb 12.8 oz (101.5 kg)   LMP 07/16/2021 (Exact Date)   SpO2 97%   BMI 33.05 kg/m    General: NAD, pleasant, able to participate in exam Respiratory: Breathing comfortably on room air, no respiratory distress Psych: Normal affect and mood  ASSESSMENT/PLAN:   Situational anxiety Discussed to BuSpar taper, recommended she take 10 mg daily for a week and then decrease to 5 mg for a week and then stop.  We discussed propranolol as an option for her situational anxiety as this can help with social and test-related anxiety.  Heart rate today 80, feel she would tolerate a small 10 mg dose. Should also continue with therapy.  Migraine without aura I refilled her ibuprofen today.  Advised patient to  only take as needed and to take with food.     Sharion Settler, [place]

## 2021-07-19 NOTE — Patient Instructions (Addendum)
It was wonderful to meet you today.  Please bring ALL of your medications with you to every visit.   Today we talked about:  -I sent a prescription for Propranolol. Take one tablet as needed for anxiety. I would take 1 hour prior to an upcoming test, social event, or anything that is anxiety-inducing for you. -I refilled your Ibuprofen. Use this as needed for your headaches. Take with food. -To taper off your Buspar, take 10 mg daily for 1 week, then decrease to 5 mg daily for 1 week and then stop.   Thank you for choosing [facility].   Please call [phone] with any questions about today's appointment.  Please be sure to schedule follow up at the front  desk before you leave today.   Sharion Settler, DO PGY-2 Family Medicine

## 2021-07-20 ENCOUNTER — Other Ambulatory Visit: Payer: Self-pay

## 2021-07-20 ENCOUNTER — Ambulatory Visit (INDEPENDENT_AMBULATORY_CARE_PROVIDER_SITE_OTHER): Payer: 59 | Admitting: Family Medicine

## 2021-07-20 DIAGNOSIS — G43009 Migraine without aura, not intractable, without status migrainosus: Secondary | ICD-10-CM

## 2021-07-20 DIAGNOSIS — F418 Other specified anxiety disorders: Secondary | ICD-10-CM

## 2021-07-20 MED ORDER — IBUPROFEN 600 MG PO TABS: 600.0000 mg | ORAL_TABLET | Freq: Four times a day (QID) | ORAL | 0 refills | Status: DC | PRN

## 2021-07-20 MED ORDER — PROPRANOLOL HCL 10 MG PO TABS: 10.0000 mg | ORAL_TABLET | ORAL | 1 refills | Status: DC | PRN

## 2021-07-20 NOTE — Assessment & Plan Note (Signed)
Discussed to BuSpar taper, recommended she take 10 mg daily for a week and then decrease to 5 mg for a week and then stop.  We discussed propranolol as an option for her situational anxiety as this can help with social and test-related anxiety.  Heart rate today 80, feel she would tolerate a small 10 mg dose. Should also continue with therapy.

## 2021-07-20 NOTE — Assessment & Plan Note (Signed)
I refilled her ibuprofen today.  Advised patient to only take as needed and to take with food.

## 2021-08-01 ENCOUNTER — Other Ambulatory Visit: Payer: Self-pay | Admitting: Family Medicine

## 2021-08-11 ENCOUNTER — Other Ambulatory Visit: Payer: Self-pay | Admitting: Family Medicine

## 2021-08-22 ENCOUNTER — Emergency Department (INDEPENDENT_AMBULATORY_CARE_PROVIDER_SITE_OTHER)
Admission: EM | Admit: 2021-08-22 | Discharge: 2021-08-22 | Disposition: A | Discharge status: Home / Self Care | Payer: BC Managed Care – PPO | Source: Home / Self Care

## 2021-08-22 ENCOUNTER — Other Ambulatory Visit: Payer: Self-pay

## 2021-08-22 DIAGNOSIS — S61216A Laceration without foreign body of right little finger without damage to nail, initial encounter: Secondary | ICD-10-CM | POA: Diagnosis not present

## 2021-08-22 NOTE — ED Triage Notes (Signed)
Pt present a lacerations to her right hand/ pinky finger. Pt is up to date with tetanus shot. (12/2019)

## 2021-08-22 NOTE — Discharge Instructions (Addendum)
Keep clean and dry Watch for infection Keep light dressing on The glue will peel off by itself in 5 to 7 days

## 2021-08-22 NOTE — ED Provider Notes (Signed)
[name]    CSN: [id number] Arrival date & time: 08/22/21  1918      History   Chief Complaint Chief Complaint  Patient presents with   Laceration    HPI Anita [name] is a 25 y.o. female.   HPI  Patient was tucking a kitchen towel over the handle of her stove when it got caught in one of the events.  She lacerated her fifth finger.  Here for evaluation  Past Medical History:  Diagnosis Date   Chlamydia infection    Migraines     Patient Active Problem List   Diagnosis Date Noted   Situational anxiety 07/20/2021   Lipoma 05/03/2021   Decreased libido 02/15/2021   Weight gain 08/25/2020   Depression 01/17/2018   Migraine without aura 01/31/2007    Past Surgical History:  Procedure Laterality Date   APPENDECTOMY     BARTHOLIN CYST MARSUPIALIZATION N/A 12/31/2019   Procedure: BARTHOLIN CYST MARSUPIALIZATION;  Surgeon: Joseph Pierini, MD;  Location: [facility];  Service: Gynecology;  Laterality: N/A;   Nexplanon      Inserted 09-14-17    OB History     Gravida  0   Para  0   Term  0   Preterm  0   AB  0   Living  0      SAB  0   IAB  0   Ectopic  0   Multiple  0   Live Births  0            Home Medications    Prior to Admission medications   Medication Sig Start Date End Date Taking? Authorizing Provider  propranolol (INDERAL) 10 MG tablet TAKE 1 TABLET BY MOUTH AS NEEDED FOR ANXIETY 08/11/21   Lind Covert, MD  acetaminophen (TYLENOL) 325 MG tablet Take 2 tablets (650 mg total) by mouth every 6 (six) hours as needed for mild pain. 12/31/19   Joseph Pierini, MD  busPIRone (BUSPAR) 10 MG tablet TAKE 1 TABLET TWICE A DAY *MAY INCREASE UP TO 2 TABLETS TWICE A DAY 08/01/21   Lind Covert, MD  ibuprofen (ADVIL) 600 MG tablet Take 1 tablet (600 mg total) by mouth every 6 (six) hours as needed. 07/20/21   Sharion Settler, DO  levonorgestrel-ethinyl estradiol (AVIANE) 0.1-20 MG-MCG tablet  Take 1 tablet by mouth daily. 03/10/21   Tamela Gammon, NP    Family History Family History  Problem Relation Age of Onset   Migraines Maternal Grandfather    Asthma Mother    Healthy Father     Social History Social History   Tobacco Use   Smoking status: Never   Smokeless tobacco: Never  Vaping Use   Vaping Use: Never used  Substance Use Topics   Alcohol use: Yes    Comment: occasionally   Drug use: No     Allergies   Patient has no known allergies.   Review of Systems Review of Systems See HPI  Physical Exam Triage Vital Signs ED Triage Vitals  Enc Vitals Group     BP 08/22/21 1925 126/71     Pulse Rate 08/22/21 1925 77     Resp 08/22/21 1925 16     Temp 08/22/21 1925 97.6 F (36.4 C)     Temp Source 08/22/21 1925 Oral     SpO2 08/22/21 1925 100 %     Weight --      Height --  Head Circumference --      Peak Flow --      Pain Score 08/22/21 1927 3     Pain Loc --      Pain Edu? --      Excl. in [place]? --    No data found.  Updated Vital Signs BP 126/71 (BP Location: Left Arm)   Pulse 77   Temp 97.6 F (36.4 C) (Oral)   Resp 16   SpO2 100%      Physical Exam Constitutional:      General: She is not in acute distress.    Appearance: She is well-developed.  HENT:     Head: Normocephalic and atraumatic.  Eyes:     Conjunctiva/sclera: Conjunctivae normal.     Pupils: Pupils are equal, round, and reactive to light.  Cardiovascular:     Rate and Rhythm: Normal rate.  Pulmonary:     Effort: Pulmonary effort is normal. No respiratory distress.  Abdominal:     General: There is no distension.     Palpations: Abdomen is soft.  Musculoskeletal:        General: Normal range of motion.     Cervical back: Normal range of motion.  Skin:    General: Skin is warm and dry.     Comments: Superficial avulsion injury to the fingertip of the fifth finger right hand. un Certain whether central flap will be viable.  Neurological:     Mental  Status: She is alert.   Procedure the hand was soaked in a Hibiclens solution for 10 minutes.  L ET anesthesia was used.  A tourniquet was placed on the finger.  The flap was examined lifted cleaned and put back in place.  It was pressed into place with pressure.  Dermabond was then applied to the tip.  Tubegauz dressing placed  UC Treatments / Results  Labs (all labs ordered are listed, but only abnormal results are displayed) Labs Reviewed - No data to display  EKG   Radiology No results found.  Procedures Procedures (including critical care time)  Medications Ordered in UC Medications - No data to display  Initial Impression / Assessment and Plan / UC Course  I have reviewed the triage vital signs and the nursing notes.  Pertinent labs & imaging results that were available during my care of the patient were reviewed by me and considered in my medical decision making (see chart for details).      Wound care discussed Final Clinical Impressions(s) / UC Diagnoses   Final diagnoses:  Laceration of right little finger without foreign body without damage to nail, initial encounter     Discharge Instructions      Keep clean and dry Watch for infection Keep light dressing on The glue will peel off by itself in 5 to 7 days   ED Prescriptions   None    PDMP not reviewed this encounter.   Raylene Everts, MD 08/22/21 2105

## 2021-08-23 ENCOUNTER — Other Ambulatory Visit: Payer: Self-pay | Admitting: Nurse Practitioner

## 2021-08-23 ENCOUNTER — Telehealth: Payer: Self-pay

## 2021-08-23 DIAGNOSIS — Z3041 Encounter for surveillance of contraceptive pills: Secondary | ICD-10-CM

## 2021-08-23 DIAGNOSIS — N921 Excessive and frequent menstruation with irregular cycle: Secondary | ICD-10-CM

## 2021-08-23 MED ORDER — LEVONORGESTREL-ETHINYL ESTRAD 0.15-30 MG-MCG PO TABS: 1.0000 | ORAL_TABLET | Freq: Every day | ORAL | 1 refills | Status: DC

## 2021-08-23 NOTE — Telephone Encounter (Signed)
Discussed with patient. She would like to go ahead and try the next dose up. Please send Rx.

## 2021-08-23 NOTE — Telephone Encounter (Signed)
It is not uncommon to have breakthrough bleeding while taking OCPs and sometimes just requires a higher dose of hormone to prevent. She is currently on Levonorgestrel-ethinyl estradiol 0.1/20 and the next dose would be 0.15/30. If she is not comfortable with increasing we will need to consider other routes that are progestin-only, but make her aware irregular bleeding can occur with those as well.

## 2021-08-23 NOTE — Telephone Encounter (Signed)
I called patient back and was prepared to leave her a message at her request but her voice mail box is full.

## 2021-08-23 NOTE — Telephone Encounter (Signed)
Patient advised. She is hesitant. She said when she was first put on bcp's she was put on this low dose because she "did not want a lot of hormones". She is interested in "how much she is going to increase it" and why increasing it will help the BTB.   I offered OV but she just wanted the answer to this to make a decision.

## 2021-08-23 NOTE — Telephone Encounter (Signed)
Patient called. She has been taking her birth control pill daily with no late or missed pills. In July she bled twice, mid pack and the placebo week. Bleeding was like a period both times lasting 4 days.  She said since then every month she has had BTB so she is having 2 periods a month.  She asked if you have any insight on why or what you might recommend.

## 2021-08-23 NOTE — Telephone Encounter (Signed)
She is on a low dose pill, so we could increase dose and see how she does. If BTB continues to occur she will need to be seen to discuss further testing and/or management options. If she is agreeable I will send new prescription to her pharmacy.

## 2021-08-24 ENCOUNTER — Other Ambulatory Visit: Payer: Self-pay | Admitting: Family Medicine

## 2021-10-31 ENCOUNTER — Ambulatory Visit (INDEPENDENT_AMBULATORY_CARE_PROVIDER_SITE_OTHER): Payer: BC Managed Care – PPO | Admitting: Obstetrics and Gynecology

## 2021-10-31 ENCOUNTER — Other Ambulatory Visit: Payer: Self-pay

## 2021-10-31 ENCOUNTER — Encounter: Payer: Self-pay | Admitting: Obstetrics and Gynecology

## 2021-10-31 VITALS — BP 122/68 | HR 77 | Ht 69.5 in | Wt 241.0 lb

## 2021-10-31 DIAGNOSIS — Z113 Encounter for screening for infections with a predominantly sexual mode of transmission: Secondary | ICD-10-CM

## 2021-10-31 DIAGNOSIS — N907 Vulvar cyst: Secondary | ICD-10-CM

## 2021-10-31 NOTE — Progress Notes (Signed)
GYNECOLOGY  VISIT   HPI: 25 y.o.   Single   Black or African American Other or two or more races Not Hispanic or Latino  female   [id number] with Patient's last menstrual period was 10/16/2021.   here for possible bartholin cyst. She has had a  Marsupialization 12/2019 but says that it did not work. When she has intercourse the cyst grows and is quite large now. She denies pain. She would also like STD testing.   She had marsupialization of a left bartholin's cyst with Dr Delilah Shan in 2/21.  Prior to that she had multiple drainage's of a bartholin's, but never had a word catheter.  It isn't currently painful.   GYNECOLOGIC HISTORY: Patient's last menstrual period was 10/16/2021. Contraception:OPC  Menopausal hormone therapy: none         OB History     Gravida  0   Para  0   Term  0   Preterm  0   AB  0   Living  0      SAB  0   IAB  0   Ectopic  0   Multiple  0   Live Births  0              Patient Active Problem List   Diagnosis Date Noted   Situational anxiety 07/20/2021   Lipoma 05/03/2021   Decreased libido 02/15/2021   Weight gain 08/25/2020   Depression 01/17/2018   Migraine without aura 01/31/2007    Past Medical History:  Diagnosis Date   Chlamydia infection    Migraines     Past Surgical History:  Procedure Laterality Date   APPENDECTOMY     BARTHOLIN CYST MARSUPIALIZATION N/A 12/31/2019   Procedure: BARTHOLIN CYST MARSUPIALIZATION;  Surgeon: Joseph Pierini, MD;  Location: [facility];  Service: Gynecology;  Laterality: N/A;   Nexplanon      Inserted 09-14-17    Current Outpatient Medications  Medication Sig Dispense Refill   acetaminophen (TYLENOL) 325 MG tablet Take 2 tablets (650 mg total) by mouth every 6 (six) hours as needed for mild pain.     ibuprofen (ADVIL) 600 MG tablet Take 1 tablet (600 mg total) by mouth every 6 (six) hours as needed. 30 tablet 0   levonorgestrel-ethinyl estradiol (NORDETTE) 0.15-30 MG-MCG  tablet Take 1 tablet by mouth daily. 84 tablet 1   propranolol (INDERAL) 10 MG tablet TAKE 1 TABLET BY MOUTH AS NEEDED FOR ANXIETY 90 tablet 0   No current facility-administered medications for this visit.     ALLERGIES: Patient has no known allergies.  Family History  Problem Relation Age of Onset   Migraines Maternal Grandfather    Asthma Mother    Healthy Father     Social History   Socioeconomic History   Marital status: Single    Spouse name: Not on file   Number of children: Not on file   Years of education: Not on file   Highest education level: Not on file  Occupational History   Not on file  Tobacco Use   Smoking status: Never   Smokeless tobacco: Never  Vaping Use   Vaping Use: Never used  Substance and Sexual Activity   Alcohol use: Yes    Comment: occasionally   Drug use: No   Sexual activity: Yes    Birth control/protection: Pill    Comment: 1ST INTERCOURSE- 17, More than 5 partners-Nexplanon inserted 09-14-17  Other Topics Concern   Not on file  Social History Narrative   Interested in being a physician would like to go to [employer]    Social Determinants of Health   Financial Resource Strain: Not on file  Food Insecurity: Not on file  Transportation Needs: Not on file  Physical Activity: Not on file  Stress: Not on file  Social Connections: Not on file  Intimate Partner Violence: Not on file    Review of Systems  All other systems reviewed and are negative.  PHYSICAL EXAMINATION:    BP 122/68    Pulse 77    Ht 5' 9.5" (1.765 m)    Wt 241 lb (109.3 kg)    LMP 10/16/2021    SpO2 100%    BMI 35.08 kg/m     General appearance: alert, cooperative and appears stated age   Pelvic: External genitalia: 4 cm thin, vulvar cyst on the lower left vulva. It is completely outside of the hymenal ring and superficial, not c/w a bartholin's. Able to completely get my fingers around the base of the cyst.               Urethra:  normal appearing urethra  with no masses, tenderness or lesions              Bartholins and Skenes: normal                 Vagina: normal appearing vagina with normal color and discharge, no lesions              Cervix: no lesions                Chaperone was present for exam.  1. Vulvar cyst ~4 cm superficial, bothersome vulvar cyst. Desires removal. - Skin excision; Future  2. Screening examination for STD (sexually transmitted disease) Declines blood work.  - SURESWAB CT/NG/T. vaginalis   Over 20 minutes in total patient care

## 2021-11-01 LAB — SURESWAB CT/NG/T. VAGINALIS
C. trachomatis RNA, TMA: NOT DETECTED
N. gonorrhoeae RNA, TMA: NOT DETECTED
Trichomonas vaginalis RNA: NOT DETECTED

## 2021-11-10 ENCOUNTER — Ambulatory Visit (INDEPENDENT_AMBULATORY_CARE_PROVIDER_SITE_OTHER): Payer: BC Managed Care – PPO | Admitting: Obstetrics and Gynecology

## 2021-11-10 ENCOUNTER — Other Ambulatory Visit (HOSPITAL_COMMUNITY)
Admission: RE | Admit: 2021-11-10 | Discharge: 2021-11-10 | Disposition: A | Discharge status: Home / Self Care | Payer: BC Managed Care – PPO | Source: Ambulatory Visit | Attending: Obstetrics and Gynecology | Admitting: Obstetrics and Gynecology

## 2021-11-10 ENCOUNTER — Other Ambulatory Visit: Payer: Self-pay

## 2021-11-10 ENCOUNTER — Encounter: Payer: Self-pay | Admitting: Obstetrics and Gynecology

## 2021-11-10 ENCOUNTER — Telehealth: Payer: Self-pay | Admitting: *Deleted

## 2021-11-10 VITALS — BP 118/64 | HR 78 | Ht 69.0 in | Wt 239.0 lb

## 2021-11-10 DIAGNOSIS — N907 Vulvar cyst: Secondary | ICD-10-CM | POA: Diagnosis not present

## 2021-11-10 MED ORDER — OXYCODONE HCL 5 MG PO CAPS: 5.0000 mg | ORAL_CAPSULE | ORAL | 0 refills | Status: DC | PRN

## 2021-11-10 MED ORDER — IBUPROFEN 800 MG PO TABS: 800.0000 mg | ORAL_TABLET | Freq: Three times a day (TID) | ORAL | 1 refills | Status: DC | PRN

## 2021-11-10 NOTE — Telephone Encounter (Signed)
Anita [name] said Dr.Jerston would like patient to come now. I called patient and patient will come now.

## 2021-11-10 NOTE — Progress Notes (Signed)
GYNECOLOGY  VISIT   HPI: 25 y.o.   Single   Black or African American Other or two or more races Not Hispanic or Latino  female   G0P0000 with Patient's last menstrual period was 10/16/2021.   here for removal of a large labia cyst.   GYNECOLOGIC HISTORY: Patient's last menstrual period was 10/16/2021. Contraception:OCP Menopausal hormone therapy: none         OB History     Gravida  0   Para  0   Term  0   Preterm  0   AB  0   Living  0      SAB  0   IAB  0   Ectopic  0   Multiple  0   Live Births  0              Patient Active Problem List   Diagnosis Date Noted   Situational anxiety 07/20/2021   Lipoma 05/03/2021   Decreased libido 02/15/2021   Weight gain 08/25/2020   Depression 01/17/2018   Migraine without aura 01/31/2007    Past Medical History:  Diagnosis Date   Chlamydia infection    Migraines     Past Surgical History:  Procedure Laterality Date   APPENDECTOMY     BARTHOLIN CYST MARSUPIALIZATION N/A 12/31/2019   Procedure: BARTHOLIN CYST MARSUPIALIZATION;  Surgeon: Joseph Pierini, MD;  Location: [facility];  Service: Gynecology;  Laterality: N/A;   Nexplanon      Inserted 09-14-17    Current Outpatient Medications  Medication Sig Dispense Refill   acetaminophen (TYLENOL) 325 MG tablet Take 2 tablets (650 mg total) by mouth every 6 (six) hours as needed for mild pain.     ibuprofen (ADVIL) 800 MG tablet Take 1 tablet (800 mg total) by mouth every 8 (eight) hours as needed. 30 tablet 1   levonorgestrel-ethinyl estradiol (NORDETTE) 0.15-30 MG-MCG tablet Take 1 tablet by mouth daily. 84 tablet 1   propranolol (INDERAL) 10 MG tablet TAKE 1 TABLET BY MOUTH AS NEEDED FOR ANXIETY 90 tablet 0   No current facility-administered medications for this visit.     ALLERGIES: Patient has no known allergies.  Family History  Problem Relation Age of Onset   Migraines Maternal Grandfather    Asthma Mother    Healthy Father      Social History   Socioeconomic History   Marital status: Single    Spouse name: Not on file   Number of children: Not on file   Years of education: Not on file   Highest education level: Not on file  Occupational History   Not on file  Tobacco Use   Smoking status: Never   Smokeless tobacco: Never  Vaping Use   Vaping Use: Never used  Substance and Sexual Activity   Alcohol use: Yes    Comment: occasionally   Drug use: No   Sexual activity: Yes    Birth control/protection: Pill    Comment: 1ST INTERCOURSE- 17, More than 5 partners-Nexplanon inserted 09-14-17  Other Topics Concern   Not on file  Social History Narrative   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  PHYSICAL EXAMINATION:    BP 118/64    Pulse 78    Ht 5\' 9"  (1.753 m)    Wt 239 lb (108.4 kg)    LMP 10/16/2021    SpO2 99%    BMI 35.29 kg/m     General appearance: alert, cooperative and appears stated age   Pelvic: External genitalia:  4 cm vulvar cyst on the lower left labia majora (not a bartholin's).               Urethra:  normal appearing urethra with no masses, tenderness or lesions              Bartholins and Skenes: normal     The risks of the procedure were reviewed with the patient and a consent was signed. The area was cleansed with betadine and injected with 1% lidocaine. A # 11 blade was used to incise the skin, making an elliptical incision around the 4 cm cyst. The cyst was removed with the #11 blade, there was some drainage of mucous fluid from the cyst during removal. Care was taken to remove all of the cyst wall. There was a  smaller, deeper second cyst posterior to the large cyst, this was also removed. The deep defect was closed with ~5 deep stitches with 3-0 vicryl. The skin was closed with~8-9 interrupted stitches with 4-0 vicryl. Hemostasis was excellent The patient tolerated the procedure well.                 Chaperone was present for exam.  1. Vulvar cyst Large and bothersome, removed. -Can use ice as needed - ibuprofen (ADVIL) 800 MG tablet; Take 1 tablet (800 mg total) by mouth every 8 (eight) hours as needed.  Dispense: 30 tablet; Refill: 1 - Surgical pathology( [facility]/ [facility])  Addendum: the patient returned several hours after the procedure c/o bleeding. She reports changing her pad 3 times, also c/o pain, not helped with ibuprofen. Her pad has a light amount of bleeding, no active bleeding from her incision, but there was a small clot external to the incision. Pressure was held over the incision for several minutes. No bleeding noted. No significant hematoma noted. Will observe her in the office for the next 20 minutes.  Will call in oxycodone and have her take tylenol as well.   Addendum: she was observed for over 30 minutes, no significant bleeding.

## 2021-11-10 NOTE — Telephone Encounter (Signed)
If she is having more than spotting she needs to be seen.

## 2021-11-10 NOTE — Patient Instructions (Signed)

## 2021-11-10 NOTE — Telephone Encounter (Signed)
Patient informed, reports the bleeding has decreased, but still more than spotting. Patient aware she will be worked in and will arrive at 1:15pm

## 2021-11-10 NOTE — Telephone Encounter (Signed)
Patient was seen today for vulvar cyst/large labia cyst removal. Patient said she is aware bleeding is normal. However patient reports when she got home she went to the bathroom the regular size pad was full and the toilet was "full" with blood. She wanted to confirm this is normal? Patient mentioned questioned if incision was still intact. Please advise

## 2021-11-19 ENCOUNTER — Encounter: Payer: Self-pay | Admitting: Obstetrics and Gynecology

## 2021-11-21 ENCOUNTER — Ambulatory Visit: Payer: BC Managed Care – PPO | Admitting: Obstetrics and Gynecology

## 2021-11-21 ENCOUNTER — Other Ambulatory Visit: Payer: Self-pay

## 2021-11-21 ENCOUNTER — Encounter: Payer: Self-pay | Admitting: Obstetrics and Gynecology

## 2021-11-21 VITALS — BP 110/70 | HR 81 | Ht 69.5 in | Wt 240.0 lb

## 2021-11-21 DIAGNOSIS — Z5189 Encounter for other specified aftercare: Secondary | ICD-10-CM | POA: Diagnosis not present

## 2021-11-21 NOTE — Progress Notes (Signed)
GYNECOLOGY  VISIT   HPI: 26 y.o.   Single   Black or African American Other or two or more races Not Hispanic or Latino  female   G0P0000 with Patient's last menstrual period was 11/16/2021.   here for drainage and itching of the incision where a vulvar cyst was removed.  The cyst was benign.   GYNECOLOGIC HISTORY: Patient's last menstrual period was 11/16/2021. Contraception:OCP Menopausal hormone therapy: none         OB History     Gravida  0   Para  0   Term  0   Preterm  0   AB  0   Living  0      SAB  0   IAB  0   Ectopic  0   Multiple  0   Live Births  0              Patient Active Problem List   Diagnosis Date Noted   Situational anxiety 07/20/2021   Lipoma 05/03/2021   Decreased libido 02/15/2021   Weight gain 08/25/2020   Depression 01/17/2018   Migraine without aura 01/31/2007    Past Medical History:  Diagnosis Date   Chlamydia infection    Migraines     Past Surgical History:  Procedure Laterality Date   APPENDECTOMY     BARTHOLIN CYST MARSUPIALIZATION N/A 12/31/2019   Procedure: BARTHOLIN CYST MARSUPIALIZATION;  Surgeon: Joseph Pierini, MD;  Location: [facility];  Service: Gynecology;  Laterality: N/A;   Nexplanon      Inserted 09-14-17    Current Outpatient Medications  Medication Sig Dispense Refill   acetaminophen (TYLENOL) 325 MG tablet Take 2 tablets (650 mg total) by mouth every 6 (six) hours as needed for mild pain.     ibuprofen (ADVIL) 800 MG tablet Take 1 tablet (800 mg total) by mouth every 8 (eight) hours as needed. 30 tablet 1   levonorgestrel-ethinyl estradiol (NORDETTE) 0.15-30 MG-MCG tablet Take 1 tablet by mouth daily. 84 tablet 1   propranolol (INDERAL) 10 MG tablet TAKE 1 TABLET BY MOUTH AS NEEDED FOR ANXIETY 90 tablet 0   No current facility-administered medications for this visit.     ALLERGIES: Patient has no known allergies.  Family History  Problem Relation Age of Onset    Migraines Maternal Grandfather    Asthma Mother    Healthy Father     Social History   Socioeconomic History   Marital status: Single    Spouse name: Not on file   Number of children: Not on file   Years of education: Not on file   Highest education level: Not on file  Occupational History   Not on file  Tobacco Use   Smoking status: Never   Smokeless tobacco: Never  Vaping Use   Vaping Use: Never used  Substance and Sexual Activity   Alcohol use: Yes    Comment: occasionally   Drug use: No   Sexual activity: Yes    Birth control/protection: Pill    Comment: 1ST INTERCOURSE- 17, More than 5 partners-Nexplanon inserted 09-14-17  Other Topics Concern   Not on file  Social History Narrative   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  PHYSICAL EXAMINATION:    BP 110/70    Pulse 81    Ht 5' 9.5" (1.765 m)    Wt 240 lb (108.9 kg)    LMP 11/16/2021    SpO2 99%    BMI 34.93 kg/m     General appearance: alert, cooperative and appears stated age   Pelvic: External genitalia:  no lesions, biopsy site is healing well, the stitches are yellow, no drainage, no erythema, no swelling. No signs of infection.               Urethra:  normal appearing urethra with no masses, tenderness or lesions              Bartholins and Skenes: normal                  Chaperone was present for exam.  1. Visit for wound check Normal exam, healing well. Patient reassured.

## 2021-11-22 LAB — SURGICAL PATHOLOGY

## 2021-12-23 ENCOUNTER — Encounter: Payer: Self-pay | Admitting: Obstetrics and Gynecology

## 2021-12-23 NOTE — Telephone Encounter (Signed)
Please schedule visit with Dr.Jertson.

## 2021-12-26 ENCOUNTER — Ambulatory Visit: Payer: BC Managed Care – PPO | Admitting: Obstetrics and Gynecology

## 2021-12-28 ENCOUNTER — Ambulatory Visit: Payer: BC Managed Care – PPO | Admitting: Obstetrics and Gynecology

## 2021-12-28 ENCOUNTER — Encounter: Payer: Self-pay | Admitting: Obstetrics and Gynecology

## 2021-12-28 ENCOUNTER — Other Ambulatory Visit: Payer: Self-pay

## 2021-12-28 VITALS — BP 110/68 | HR 72 | Ht 69.0 in | Wt 243.0 lb

## 2021-12-28 DIAGNOSIS — N941 Unspecified dyspareunia: Secondary | ICD-10-CM | POA: Diagnosis not present

## 2021-12-28 NOTE — Patient Instructions (Signed)
Try taking ibuprofen 1 hour prior to intercourse  Try uberlube for vaginal lubrication  You should control the rate and depth of penetration with intercourse  You can use an ice pack to the vulva as needed (make sure you have a cloth between the ice and your skin)

## 2021-12-28 NOTE — Progress Notes (Signed)
GYNECOLOGY  VISIT   HPI: 26 y.o.   Single   Black or African American Other or two or more races Not Hispanic or Latino  female   G0P0000 with No LMP recorded. (Menstrual status: Oral contraceptives).   She had a large, deep left vulvar cyst removed on 11/10/21. She is here c/p pain where vulvar cyst was removed.  She tried having sex a few weeks ago and couldn't because it was too painful. Yesterday she had sex and it was painful (6/10).    GYNECOLOGIC HISTORY: No LMP recorded. (Menstrual status: Oral contraceptives). Contraception:OCP Menopausal hormone therapy: none         OB History     Gravida  0   Para  0   Term  0   Preterm  0   AB  0   Living  0      SAB  0   IAB  0   Ectopic  0   Multiple  0   Live Births  0              Patient Active Problem List   Diagnosis Date Noted   Situational anxiety 07/20/2021   Lipoma 05/03/2021   Decreased libido 02/15/2021   Weight gain 08/25/2020   Depression 01/17/2018   Migraine without aura 01/31/2007    Past Medical History:  Diagnosis Date   Chlamydia infection    Migraines     Past Surgical History:  Procedure Laterality Date   APPENDECTOMY     BARTHOLIN CYST MARSUPIALIZATION N/A 12/31/2019   Procedure: BARTHOLIN CYST MARSUPIALIZATION;  Surgeon: Joseph Pierini, MD;  Location: [facility];  Service: Gynecology;  Laterality: N/A;   Nexplanon      Inserted 09-14-17    Current Outpatient Medications  Medication Sig Dispense Refill   acetaminophen (TYLENOL) 325 MG tablet Take 2 tablets (650 mg total) by mouth every 6 (six) hours as needed for mild pain.     ibuprofen (ADVIL) 800 MG tablet Take 1 tablet (800 mg total) by mouth every 8 (eight) hours as needed. 30 tablet 1   levonorgestrel-ethinyl estradiol (NORDETTE) 0.15-30 MG-MCG tablet Take 1 tablet by mouth daily. 84 tablet 1   propranolol (INDERAL) 10 MG tablet TAKE 1 TABLET BY MOUTH AS NEEDED FOR ANXIETY 90 tablet 0   No current  facility-administered medications for this visit.     ALLERGIES: Patient has no known allergies.  Family History  Problem Relation Age of Onset   Migraines Maternal Grandfather    Asthma Mother    Healthy Father     Social History   Socioeconomic History   Marital status: Single    Spouse name: Not on file   Number of children: Not on file   Years of education: Not on file   Highest education level: Not on file  Occupational History   Not on file  Tobacco Use   Smoking status: Never   Smokeless tobacco: Never  Vaping Use   Vaping Use: Never used  Substance and Sexual Activity   Alcohol use: Yes    Comment: occasionally   Drug use: No   Sexual activity: Yes    Birth control/protection: Pill    Comment: 1ST INTERCOURSE- 17, More than 5 partners-Nexplanon inserted 09-14-17  Other Topics Concern   Not on file  Social History Narrative   Interested in being a physician would like to go to [employer]  Resource Strain: Not on file  Food Insecurity: Not on file  Transportation Needs: Not on file  Physical Activity: Not on file  Stress: Not on file  Social Connections: Not on file  Intimate Partner Violence: Not on file    Review of Systems  All other systems reviewed and are negative.  PHYSICAL EXAMINATION:    BP 110/68    Pulse 72    Ht 5\' 9"  (1.753 m)    Wt 243 lb (110.2 kg)    SpO2 100%    BMI 35.88 kg/m     General appearance: alert, cooperative and appears stated age  Pelvic: External genitalia:  no lesions, her skin has healed well, scar not well visualized. Tender with pressing on the lower left vulva. No new cyst formation               Urethra:  normal appearing urethra with no masses, tenderness or lesions              Bartholins and Skenes: normal                  Chaperone was present for exam.  1. Dyspareunia, female S/p removal of a large, deep left vulvar cyst. Vulva is healing well. -I  anticipate it will become less tender with time -She can take ibuprofen prior to intercourse -Use lubricant - She should control the rate and depth of penetration -We discussed topical lidocaine, but the pain seems deeper than the skin -Can use ice packs as needed -Reach out if her pain doesn't improve.

## 2022-01-25 ENCOUNTER — Encounter: Payer: Self-pay | Admitting: Family Medicine

## 2022-01-27 ENCOUNTER — Other Ambulatory Visit: Payer: Self-pay

## 2022-01-27 ENCOUNTER — Ambulatory Visit: Payer: BC Managed Care – PPO | Admitting: Family Medicine

## 2022-01-27 VITALS — BP 134/76 | HR 66 | Ht 69.0 in | Wt 241.5 lb

## 2022-01-27 DIAGNOSIS — L290 Pruritus ani: Secondary | ICD-10-CM | POA: Diagnosis not present

## 2022-01-27 NOTE — Progress Notes (Signed)
? ? ?  SUBJECTIVE:  ? ?CHIEF COMPLAINT / HPI:  ? ?Perianal itching ?2 weeks of perianal itching. Endorses new healthier diet increased in fiber around the time this started. Has occurred once before when she was younger she had pinworms. 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 Denies increased night-time pruritis, new sexual partners, anal sex, bloody stools or pain with bowel movements. Declines STD testing; states this was recently done.  ? ?PERTINENT  PMH / PSH: As above.  ? ?OBJECTIVE:  ? ?BP 134/76   Pulse 66   Ht '5\' 9"'$  (1.753 m)   Wt 241 lb 8 oz (109.5 kg)   SpO2 98%   BMI 35.66 kg/m?   ?Physical Exam ?Vitals reviewed. Exam conducted with a chaperone present.  ?Constitutional:   ?   General: She is not in acute distress. ?   Appearance: She is not ill-appearing, toxic-appearing or diaphoretic.  ?Pulmonary:  ?   Effort: Pulmonary effort is normal.  ?Genitourinary: ?   Rectum: Normal. No mass, tenderness, anal fissure, external hemorrhoid or internal hemorrhoid. Normal anal tone.  ?Neurological:  ?   Mental Status: She is alert and oriented to person, place, and time.  ?Psychiatric:     ?   Mood and Affect: Mood normal.     ?   Behavior: Behavior normal.  ? ?ASSESSMENT/PLAN:  ? ?Anal pruritus ?Ongoing for 2 weeks. Worse during the day. Noticed after change in diet. 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 Pinworms less likely. No signs of anal tear or hemorrhoids. Can consider dietary or changes in toiletries. Patient desires to try pinworm cream. I expressed likely not needed in this case. Dietary cautions given in AVS. Encouraged to follow up if symptoms persist, worsen, or fail to improve.  ? ?  ? ? ?Gardenia Witter Autry-Lott, DO ?[place]  ?

## 2022-01-27 NOTE — Patient Instructions (Signed)
It was wonderful to see you today. ? ?Please bring ALL of your medications with you to every visit.  ? ?Today we talked about: ? ?Perianal itching. I do not see any infection today. Consider decreasing your diet in irritants such as  ?Coffee  ?Cola  ?Beer  ?Tomatoes  ?Chocolate  ?Tea  ?Citrus fruits  ? ?If this does not improve follow up .  ? ?Please be sure to schedule follow up at the front  desk before you leave today.  ? ?If you haven't already, sign up for My Chart to have easy access to your labs results, and communication with your primary care physician. ? ?Please call the clinic at [phone] if your symptoms worsen or you have any concerns. It was our pleasure to serve you. ? ?Dr. Janus Molder ? ?

## 2022-01-31 ENCOUNTER — Telehealth: Payer: Self-pay

## 2022-01-31 NOTE — Telephone Encounter (Signed)
That's fine.  Thank you.

## 2022-01-31 NOTE — Telephone Encounter (Signed)
Patient called because she is having some perianal symptoms. She saw PCP 01/27/22 (is in her chart)  but symptoms still persist and she would like to come in for exam with Marisa Sprinkles. ,NP.  Message sent to appt desk to call patient and arrange visit. ?

## 2022-02-02 ENCOUNTER — Telehealth: Payer: Self-pay

## 2022-02-02 ENCOUNTER — Other Ambulatory Visit: Payer: Self-pay

## 2022-02-02 ENCOUNTER — Encounter: Payer: Self-pay | Admitting: Nurse Practitioner

## 2022-02-02 ENCOUNTER — Other Ambulatory Visit: Payer: Self-pay | Admitting: Nurse Practitioner

## 2022-02-02 ENCOUNTER — Ambulatory Visit: Payer: BC Managed Care – PPO | Admitting: Nurse Practitioner

## 2022-02-02 VITALS — BP 114/70

## 2022-02-02 DIAGNOSIS — Z113 Encounter for screening for infections with a predominantly sexual mode of transmission: Secondary | ICD-10-CM

## 2022-02-02 DIAGNOSIS — N898 Other specified noninflammatory disorders of vagina: Secondary | ICD-10-CM

## 2022-02-02 DIAGNOSIS — L290 Pruritus ani: Secondary | ICD-10-CM

## 2022-02-02 DIAGNOSIS — Z3041 Encounter for surveillance of contraceptive pills: Secondary | ICD-10-CM

## 2022-02-02 LAB — WET PREP FOR TRICH, YEAST, CLUE

## 2022-02-02 MED ORDER — TRIAMCINOLONE ACETONIDE 0.5 % EX OINT: 1.0000 "application " | TOPICAL_OINTMENT | Freq: Two times a day (BID) | CUTANEOUS | 0 refills | Status: DC

## 2022-02-02 MED ORDER — JUNEL FE 24 1-20 MG-MCG(24) PO TABS: 1.0000 | ORAL_TABLET | Freq: Every day | ORAL | 0 refills | Status: DC

## 2022-02-02 NOTE — Progress Notes (Signed)
? ?  Acute Office Visit ? ?Subjective:  ? ? Patient ID: Anita [name], female    DOB: 1995/11/21, 26 y.o.   MRN: [id number] ? ? ?HPI ?26 y.o. presents today for anal itching. Reports change in diet with increased fiber and protein, deceased calories. Symptoms started after this change. No changes in bowel movements. She did cut back on fiber after reading online that this can cause similar symptoms and she feels slight improvement. Itching is random, not just after bowel movements and not worse at night. She did notice mild vaginal itching yesterday. She denies anal sex or new sexual partners. Saw PCP who recommended changing diet to see if this helps.  ? ? ?Review of Systems  ?Constitutional: Negative.   ?Gastrointestinal:  Negative for abdominal pain, anal bleeding, blood in stool, constipation, diarrhea and rectal pain (Itching).  ?Genitourinary:  Positive for dyspareunia (From previous cyst removal in December, getting better) and vaginal pain (Itching). Negative for genital sores and vaginal discharge.  ? ?   ?Objective:  ?  ?Physical Exam ?Constitutional:   ?   Appearance: Normal appearance.  ?Genitourinary: ?   General: Normal vulva.  ?   Vagina: Normal.  ?   Cervix: Normal.  ?   Rectum: Normal. No mass, tenderness, anal fissure, external hemorrhoid or internal hemorrhoid.  ? ? ?BP 114/70   LMP 01/24/2022  ?Wt Readings from Last 3 Encounters:  ?01/27/22 241 lb 8 oz (109.5 kg)  ?12/28/21 243 lb (110.2 kg)  ?11/21/21 240 lb (108.9 kg)  ? ?Wet prep negative ?   ? ?Patient informed chaperone available to be present for breast and pelvic exam. Patient has requested no chaperone to be present. Patient has been advised what will be completed during breast and pelvic exam.  ? ?Assessment & Plan:  ? ?Problem List Items Addressed This Visit   ?None ?Visit Diagnoses   ? ? Anal itching    -  Primary  ? Relevant Medications  ? triamcinolone ointment (KENALOG) 0.5 %  ? Vaginal itching      ? Relevant Orders  ? WET PREP FOR  Connor Foxworthy, YEAST, CLUE (Completed)  ? Screen for STD (sexually transmitted disease)      ? Relevant Orders  ? C. trachomatis/N. gonorrhoeae RNA  ? ?  ? ?Plan: Negative wet prep and exam. Symptoms likely from diet change. Decrease fiber. Recommend good hygiene and may apply zinc oxide ointment such as Desitin for moisture barrier. Apply Triamcinolone ointment BID x 7 days. STD panel pending, declines HIV/RPR.  ? ? ? ? ?Tamela Gammon DNP, 9:34 AM 02/02/2022 ? ?

## 2022-02-02 NOTE — Telephone Encounter (Signed)
I would recommend switching her OCPs back to a lower dose of estrogen. I will send in new prescription for Junel 1-20 (24) if she is agreeable. This will also have less placebo pills.  ?

## 2022-02-02 NOTE — Telephone Encounter (Signed)
Patient currently taking generic Nordette.   Patient's dose was increased back on 08/23/21 because she was having BTB at that time. ? ?Patient reports the last 3 packs having her "period" in the 3rd week of pills instead of in the last week when it should occur.  She denies any missed or late pills. ? ?She said she had meant to ask you at her visit today but forgot. ? ? ?

## 2022-02-02 NOTE — Telephone Encounter (Signed)
Patient informed and is agreeable to that plan. Pharmacy is set. ?

## 2022-02-03 LAB — C. TRACHOMATIS/N. GONORRHOEAE RNA
C. trachomatis RNA, TMA: NOT DETECTED
N. gonorrhoeae RNA, TMA: NOT DETECTED

## 2022-02-13 ENCOUNTER — Ambulatory Visit: Payer: BC Managed Care – PPO | Admitting: Obstetrics and Gynecology

## 2022-02-14 ENCOUNTER — Ambulatory Visit: Payer: BC Managed Care – PPO | Admitting: Obstetrics and Gynecology

## 2022-02-18 ENCOUNTER — Other Ambulatory Visit: Payer: Self-pay | Admitting: Nurse Practitioner

## 2022-02-18 DIAGNOSIS — N921 Excessive and frequent menstruation with irregular cycle: Secondary | ICD-10-CM

## 2022-02-18 DIAGNOSIS — Z3041 Encounter for surveillance of contraceptive pills: Secondary | ICD-10-CM

## 2022-03-14 ENCOUNTER — Encounter: Payer: Self-pay | Admitting: Nurse Practitioner

## 2022-03-14 ENCOUNTER — Ambulatory Visit (INDEPENDENT_AMBULATORY_CARE_PROVIDER_SITE_OTHER): Payer: BC Managed Care – PPO | Admitting: Nurse Practitioner

## 2022-03-14 ENCOUNTER — Other Ambulatory Visit (HOSPITAL_COMMUNITY)
Admission: RE | Admit: 2022-03-14 | Discharge: 2022-03-14 | Disposition: A | Discharge status: Home / Self Care | Payer: BC Managed Care – PPO | Source: Ambulatory Visit | Attending: Nurse Practitioner | Admitting: Nurse Practitioner

## 2022-03-14 VITALS — BP 116/70 | Ht 69.0 in | Wt 241.0 lb

## 2022-03-14 DIAGNOSIS — Z01419 Encounter for gynecological examination (general) (routine) without abnormal findings: Secondary | ICD-10-CM | POA: Insufficient documentation

## 2022-03-14 DIAGNOSIS — Z3041 Encounter for surveillance of contraceptive pills: Secondary | ICD-10-CM | POA: Diagnosis not present

## 2022-03-14 MED ORDER — JUNEL FE 24 1-20 MG-MCG(24) PO TABS: 1.0000 | ORAL_TABLET | Freq: Every day | ORAL | 3 refills | Status: DC

## 2022-03-14 NOTE — Progress Notes (Signed)
? ?  Anita [name] January 29, 1996 [id number] ? ? ?History:  26 y.o. G0 presents for annual exam. Monthly cycle. Stopped OCPs last week and wants to go without for 1-2 months. She typically has her menses the 3rd week of her pill pack. Plans to use condoms during that time. Gardasil series completed. Normal pap history. 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 Negative STD screening 02/02/2022. Left labial cyst removed 10/2021. It has recurred a few times but drains on it's own. She just had one rupture yesterday, so she is little tender.  ? ?Gynecologic History ?Patient's last menstrual period was 02/21/2022. ?Period Cycle (Days): 28 ?Period Duration (Days): 5 ?Period Pattern: Regular ?Menstrual Flow: Moderate ?Dysmenorrhea: None ?Contraception/Family planning: OCP (estrogen/progesterone) ?Sexually active: Yes ? ?Health Maintenance ?Last Pap: 03/05/2019. Results were: Normal ?Last mammogram: N/A ?Last colonoscopy: N/A ?Last Dexa: N/A ? ?Past medical history, past surgical history, family history and social history were all reviewed and documented in the EPIC chart. 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  ? ?ROS:  A ROS was performed and pertinent positives and negatives are included. ? ?Exam: ? ?Vitals:  ? 03/14/22 0959  ?BP: 116/70  ?Weight: 241 lb (109.3 kg)  ?Height: '5\' 9"'$  (1.753 m)  ? ? ?Body mass index is 35.59 kg/m?. ? ?General appearance:  Normal ?Thyroid:  Symmetrical, normal in size, without palpable masses or nodularity. ?Respiratory ? Auscultation:  Clear without wheezing or rhonchi ?Cardiovascular ? Auscultation:  Regular rate, without rubs, murmurs or gallops ? Edema/varicosities:  Not grossly evident ?Abdominal ? Soft,nontender, without masses, guarding or rebound. ? Liver/spleen:  No organomegaly noted ? Hernia:  None appreciated ? Skin ? Inspection:  Grossly normal ?Breasts: Not indicated per guidelines ?Gentitourinary  ? Inguinal/mons:   Normal without inguinal adenopathy ? External genitalia:  Normal appearing vulva with no masses, tenderness, or lesions. No evidence of bartholin cyst ? BUS/Urethra/Skene's glands:  Normal ? Vagina:  Normal appearing with normal color and discharge, no lesions ? Cervix:  Normal appearing without discharge or lesions ? Uterus:  Normal in size, shape and contour.  Midline and mobile, nontender ? Adnexa/parametria:   ?  Rt: Normal in size, without masses or tenderness. ?  Lt: Normal in size, without masses or tenderness. ? Anus and perineum: Normal ? ?Patient informed chaperone available to be present for breast and pelvic exam. Patient has requested no chaperone to be present. Patient has been advised what will be completed during breast and pelvic exam.  ? ?Assessment/Plan:  26 y.o. G0 for annual exam.  ? ?Well female exam with routine gynecological exam - Plan: Cytology - PAP( [place] [facility]). Education provided on SBEs, importance of preventative screenings, current guidelines, high calcium diet, regular exercise, and multivitamin daily. Has PCP.  ? ?Encounter for surveillance of contraceptive pills - Plan: Norethindrone Acetate-Ethinyl Estrad-FE (JUNEL FE 24) 1-20 MG-MCG(24) tablet daily. Not currently taking but plans to restart in 1-2 months. Will use condoms during that time. She was having menses the 3rd week of her pack instead of during placebo week.  ? ?Screening for cervical cancer - Normal pap history. Pap today.  ? ?Return in 1 year for annual.  ? ? ? ? ?Tamela Gammon DNP, 10:08 AM 03/14/2022 ? ?

## 2022-03-15 LAB — CYTOLOGY - PAP: Diagnosis: NEGATIVE

## 2022-04-18 ENCOUNTER — Encounter: Payer: Self-pay | Admitting: *Deleted

## 2022-04-18 ENCOUNTER — Ambulatory Visit: Payer: BC Managed Care – PPO | Admitting: Nurse Practitioner

## 2022-06-08 NOTE — Patient Instructions (Addendum)
It was nice seeing you today!  Take albendazole two tablets for one dose on an empty stomach.  Stay well, Zola Button, MD [place] [phone]  --  Make sure to check out at the front desk before you leave today.  Please arrive at least 15 minutes prior to your scheduled appointments.  If you had blood work today, I will send you a MyChart message or a letter if results are normal. Otherwise, I will give you a call.  If you had a referral placed, they will call you to set up an appointment. Please give [place] a call if you don't hear back in the next 2 weeks.  If you need additional refills before your next appointment, please call your pharmacy first.

## 2022-06-08 NOTE — Progress Notes (Signed)
    SUBJECTIVE:   CHIEF COMPLAINT / HPI:  Chief Complaint  Patient presents with   Pinworm    Patient presents for rectal itching that started about 4 days ago after recently returning from a cruise.  She did eat some Chia seeds to try to increase fiber intake the morning before symptoms began.  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  She did notice something white in her stool but did not look like a worm specifically.  Itchiness is worse in the morning.  Denies rectal pain, bleeding.  No history of hemorrhoids.  PERTINENT  PMH / PSH: depression, situational anxiety  Patient Care Team: Lind Covert, MD as PCP - General   OBJECTIVE:   BP (!) 113/54   Pulse 72   Ht '5\' 9"'$  (1.753 m)   Wt 251 lb 3.2 oz (113.9 kg)   LMP 06/01/2022   SpO2 100%   BMI 37.10 kg/m   Physical Exam Exam conducted with a chaperone present.  Constitutional:      General: She is not in acute distress. Cardiovascular:     Rate and Rhythm: Normal rate.  Pulmonary:     Effort: Pulmonary effort is normal. No respiratory distress.  Genitourinary:    Rectum: Normal.     Comments: Skin appears normal around the rectum.  No areas of irritation or hemorrhoids visualized. Musculoskeletal:     Cervical back: Neck supple.  Neurological:     Mental Status: She is alert.         06/09/2022    2:56 PM  Depression screen PHQ 2/9  Decreased Interest 1  Down, Depressed, Hopeless 1  PHQ - 2 Score 2  Altered sleeping 2  Tired, decreased energy 1  Change in appetite 1  Feeling bad or failure about yourself  0  Trouble concentrating 0  Moving slowly or fidgety/restless 0  Suicidal thoughts 0  PHQ-9 Score 6  Difficult doing work/chores Somewhat difficult     {Show previous vital signs (optional):23777}    ASSESSMENT/PLAN:   Anal pruritis No hemorrhoids or obvious abnormalities visualized on exam.   She has not noticed any worms specifically but could be reasonable to treat for pinworms given prior history.  Question if chia seeds could have possibly caused this. - albendazole  Return if symptoms worsen or fail to improve.   Zola Button, MD [place]

## 2022-06-09 ENCOUNTER — Ambulatory Visit (INDEPENDENT_AMBULATORY_CARE_PROVIDER_SITE_OTHER): Payer: BC Managed Care – PPO | Admitting: Family Medicine

## 2022-06-09 VITALS — BP 113/54 | HR 72 | Ht 69.0 in | Wt 251.2 lb

## 2022-06-09 DIAGNOSIS — L290 Pruritus ani: Secondary | ICD-10-CM

## 2022-06-09 MED ORDER — ALBENDAZOLE 200 MG PO TABS: 400.0000 mg | ORAL_TABLET | Freq: Once | ORAL | 0 refills | Status: AC

## 2022-07-09 ENCOUNTER — Other Ambulatory Visit: Payer: Self-pay | Admitting: Obstetrics and Gynecology

## 2022-07-09 DIAGNOSIS — N907 Vulvar cyst: Secondary | ICD-10-CM

## 2022-07-25 ENCOUNTER — Ambulatory Visit: Payer: BC Managed Care – PPO | Admitting: Family Medicine

## 2022-07-25 NOTE — Progress Notes (Deleted)
    SUBJECTIVE:   CHIEF COMPLAINT / HPI:   Anita [name] is a 26 y.o. female who presents to the [facility] clinic today to discuss the following concerns:   Sick Symptoms   PERTINENT  PMH / PSH: Depression, Migraine with aura   OBJECTIVE:   There were no vitals taken for this visit. ***  General: NAD, pleasant, able to participate in exam HEENT: Normocephalic, TM ***, *** Cardiac: RRR, no murmurs. Respiratory: CTAB, normal effort, No wheezes, rales or rhonchi Abdomen: Bowel sounds present, nontender, nondistended, no hepatosplenomegaly. Extremities: no edema or cyanosis. Skin: warm and dry, no rashes noted Neuro: alert, no obvious focal deficits Psych: Normal affect and mood  ASSESSMENT/PLAN:   No problem-specific Assessment & Plan notes found for this encounter.     Sharion Settler, [place]

## 2022-08-22 ENCOUNTER — Ambulatory Visit: Payer: BC Managed Care – PPO | Admitting: Radiology

## 2022-08-22 VITALS — BP 118/76

## 2022-08-22 DIAGNOSIS — N907 Vulvar cyst: Secondary | ICD-10-CM

## 2022-08-22 DIAGNOSIS — R102 Pelvic and perineal pain: Secondary | ICD-10-CM | POA: Diagnosis not present

## 2022-08-22 MED ORDER — SULFAMETHOXAZOLE-TRIMETHOPRIM 800-160 MG PO TABS: 1.0000 | ORAL_TABLET | Freq: Two times a day (BID) | ORAL | 0 refills | Status: DC

## 2022-08-22 NOTE — Progress Notes (Signed)
      Subjective: Anita [name] is a 26 y.o. female who complains of recurrent left bartholin's gland cyst. Marsupialization 2021. Complains of swelling, tenderness left labia (sx's increased over the last week). Also complains of lower pelvic menstrual type cramping (started this morning). No new sex partners   Review of Systems  All other systems reviewed and are negative.   Past Medical History:  Diagnosis Date   Chlamydia infection    Migraines       Objective:  Today's Vitals   08/22/22 1032  BP: 118/76   There is no height or weight on file to calculate BMI.   -General: no acute distress -Vulva: 3cm thin walled cyst present on the lower aspect of the left vulva outside of the hymenal ring. Not c/w with bartholin's. A small amount of white d/c is seen coming from a small pustule on the top, expressed without pain or difficulty. Declines full I+D -Vagina: normal appearing with no d/c or lesions -Cervix: no lesion or discharge, no CMT -Perineum: no lesions -Uterus: Mobile, tender over uterus and bladder with palpation -Adnexa: no masses or tenderness  Urine dipstick shows positive for RBC's and positive for leukocytes.  Micro exam:  WBC's per HPF,  RBC's per HPF, and + bacteria.   Chaperone offered and declined.  Assessment:/Plan:   1. Pelvic pain ?cystitis - Urinalysis,Complete w/RFL Culture - Pregnancy, urine  2. Vulvar cyst - sulfamethoxazole-trimethoprim (BACTRIM DS) 800-160 MG tablet; Take 1 tablet by mouth 2 (two) times daily.  Dispense: 14 tablet; Refill: 0    Will contact patient with results of testing completed today. Avoid intercourse until symptoms are resolved. Warm compresses BID-TID Avoid the use of soaps or perfumed products in the peri area. Avoid tub baths and sitting in sweaty or wet clothing for prolonged periods of time.

## 2022-08-25 NOTE — Telephone Encounter (Signed)
Negative urine culture, no UTI

## 2022-08-25 NOTE — Telephone Encounter (Signed)
Patient aware of results.

## 2022-08-25 NOTE — Telephone Encounter (Signed)
Patient has questions related to urine collected 08/22/2022

## 2022-09-07 LAB — URINALYSIS, COMPLETE W/RFL CULTURE
Bilirubin Urine: NEGATIVE
Glucose, UA: NEGATIVE
Hyaline Cast: NONE SEEN /LPF
Ketones, ur: NEGATIVE
Nitrites, Initial: NEGATIVE
Protein, ur: NEGATIVE
Specific Gravity, Urine: 1.01 (ref 1.001–1.035)
pH: 6 (ref 5.0–8.0)

## 2022-09-07 LAB — PREGNANCY, URINE: Preg Test, Ur: NEGATIVE

## 2022-09-07 LAB — URINE CULTURE
MICRO NUMBER:: 14030871
SPECIMEN QUALITY:: ADEQUATE

## 2022-09-07 LAB — CULTURE INDICATED

## 2022-09-12 ENCOUNTER — Ambulatory Visit: Payer: BC Managed Care – PPO | Admitting: Radiology

## 2022-09-12 ENCOUNTER — Other Ambulatory Visit: Payer: Self-pay | Admitting: *Deleted

## 2022-09-12 ENCOUNTER — Other Ambulatory Visit: Payer: Self-pay | Admitting: Radiology

## 2022-09-12 VITALS — BP 108/72

## 2022-09-12 DIAGNOSIS — N907 Vulvar cyst: Secondary | ICD-10-CM | POA: Diagnosis not present

## 2022-09-12 DIAGNOSIS — R102 Pelvic and perineal pain: Secondary | ICD-10-CM

## 2022-09-12 DIAGNOSIS — Z308 Encounter for other contraceptive management: Secondary | ICD-10-CM | POA: Diagnosis not present

## 2022-09-12 LAB — WET PREP FOR TRICH, YEAST, CLUE

## 2022-09-12 MED ORDER — XULANE 150-35 MCG/24HR TD PTWK: 1.0000 | MEDICATED_PATCH | TRANSDERMAL | 1 refills | Status: DC

## 2022-09-12 NOTE — Progress Notes (Signed)
      Subjective: Anita [name] is a 26 y.o. female who complains of recurrent left vulvar cyst. Marsupialization 2021. Complains of swelling, tenderness left labia (sx's increased over the last week). Patient used hot compresses yesterday and symptoms are better. Some vaginal pain, would like wet mount.  Review of Systems  All other systems reviewed and are negative.   Past Medical History:  Diagnosis Date   Chlamydia infection    Migraines       Objective:  Today's Vitals   09/12/22 1119  BP: 108/72   There is no height or weight on file to calculate BMI.   -General: no acute distress -Vulva: 3cm thin walled cyst present on the lower aspect of the left vulva outside of the hymenal ring. Not c/w with bartholin's. A small amount of white d/c is seen coming from a small pustule on the top, expressed without pain or difficulty. Area cleansesd with betadine and injected with 2cc of 1 %lidocaine with epi.an 11 blade was used to make a 29m incision and about 3cc of thick clear fluid was expressed, no cyst wall found with probing.  -Vagina: normal appearing with no d/c or lesions -Cervix: no lesion or discharge, no CMT -Perineum: no lesions -Uterus: Mobile, tender over uterus and bladder with palpation -Adnexa: no masses or tenderness  Microscopic wet-mount exam shows negative for pathogens, normal epithelial cells.   Chaperone offered and declined.  Assessment:/Plan:   1. Vulvar cyst I+D complete  After care reviewed If sx persist will bring for surgical consult with Dr LDellis Filbert 2. Pelvic pain Reassured neg wet mount  3. Encounter for other contraceptive management Low libido with OCPs, will try the patch - norelgestromin-ethinyl estradiol (Anita [name] 150-35 MCG/24HR transdermal patch; Place 1 patch onto the skin once a week.  Dispense: 9 patch; Refill: 1      Avoid intercourse until symptoms are resolved. Warm compresses BID-TID Avoid the use of soaps or perfumed products  in the peri area. Avoid tub baths and sitting in sweaty or wet clothing for prolonged periods of time.

## 2022-09-13 ENCOUNTER — Other Ambulatory Visit: Payer: Self-pay | Admitting: Obstetrics and Gynecology

## 2022-09-13 DIAGNOSIS — N907 Vulvar cyst: Secondary | ICD-10-CM

## 2022-11-16 ENCOUNTER — Ambulatory Visit (INDEPENDENT_AMBULATORY_CARE_PROVIDER_SITE_OTHER): Payer: BC Managed Care – PPO | Admitting: Family Medicine

## 2022-11-16 ENCOUNTER — Encounter: Payer: Self-pay | Admitting: Family Medicine

## 2022-11-16 VITALS — BP 121/80 | HR 83 | Temp 98.1°F | Ht 69.0 in | Wt 260.6 lb

## 2022-11-16 DIAGNOSIS — Z13220 Encounter for screening for lipoid disorders: Secondary | ICD-10-CM

## 2022-11-16 DIAGNOSIS — Z131 Encounter for screening for diabetes mellitus: Secondary | ICD-10-CM

## 2022-11-16 DIAGNOSIS — R635 Abnormal weight gain: Secondary | ICD-10-CM

## 2022-11-16 LAB — POCT GLYCOSYLATED HEMOGLOBIN (HGB A1C): Hemoglobin A1C: 4.9 % (ref 4.0–5.6)
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
## 2022-11-16 NOTE — Patient Instructions (Addendum)
It was great to see you today! Thank you for choosing [facility] for your primary care. Anita [name] was seen for weight gain.  Today we addressed: Weight gain - we discussed doing a HIIT workout for 15-20 minutes 1-2 times per week and discussed using wellbutrin, metformin as medication options. Also your sleep plays a big role too!  Please try to decrease screen time before sleep.  Contraception - Think about OCPs versus nexplanon and let me know which one you'd like to do!   If you haven't already, sign up for My Chart to have easy access to your labs results, and communication with your primary care physician.  We are checking some labs today. If they are abnormal, I will call you. If they are normal, I will send you a MyChart message (if it is active) or a letter in the mail. If you do not hear about your labs in the next 2 weeks, please call the office.   You should return to our clinic 2/2 4:10 pm.   I recommend that you always bring your medications to each appointment as this makes it easy to ensure you are on the correct medications and helps [place] not miss refills when you need them.  Please arrive 15 minutes before your appointment to ensure smooth check in process.  We appreciate your efforts in making this happen.  Please call the clinic at [phone] if your symptoms worsen or you have any concerns.  Thank you for allowing me to participate in your care, Lowry Ram, MD 11/16/2022, 3:52 PM PGY-1, [place]

## 2022-11-16 NOTE — Assessment & Plan Note (Signed)
Weight gain most likely due to excess calories. Less likely thyroid disease or endocrine disorder. Wellbutrin not a good option as patient endorses worsening anxiety as well. Metformin also not a good option as A1c is 4.9. Patient would like to hold off on any GLP 1 at this time.  - SMART goal 15-20 minute HIIT workout 1-2 times a week for 4 weeks.  - F/u in 4 weeks

## 2022-11-17 LAB — LIPID PANEL
Chol/HDL Ratio: 3.8 ratio (ref 0.0–4.4)
Cholesterol, Total: 176 mg/dL (ref 100–199)
HDL: 46 mg/dL (ref >39)
LDL Chol Calc (NIH): 117 mg/dL — ABNORMAL HIGH (ref 0–99)
Triglycerides: 66 mg/dL (ref 0–149)
VLDL Cholesterol Cal: 13 mg/dL (ref 5–40)

## 2022-11-17 LAB — TSH RFX ON ABNORMAL TO FREE T4: TSH: 1.06 u[IU]/mL (ref 0.450–4.500)

## 2022-11-19 DIAGNOSIS — F411 Generalized anxiety disorder: Secondary | ICD-10-CM | POA: Diagnosis not present

## 2022-11-19 DIAGNOSIS — F330 Major depressive disorder, recurrent, mild: Secondary | ICD-10-CM | POA: Diagnosis not present

## 2022-11-23 ENCOUNTER — Encounter: Payer: Self-pay | Admitting: Family Medicine

## 2022-11-25 DIAGNOSIS — F411 Generalized anxiety disorder: Secondary | ICD-10-CM | POA: Diagnosis not present

## 2022-11-25 DIAGNOSIS — F330 Major depressive disorder, recurrent, mild: Secondary | ICD-10-CM | POA: Diagnosis not present

## 2022-12-03 DIAGNOSIS — F411 Generalized anxiety disorder: Secondary | ICD-10-CM | POA: Diagnosis not present

## 2022-12-03 DIAGNOSIS — F330 Major depressive disorder, recurrent, mild: Secondary | ICD-10-CM | POA: Diagnosis not present

## 2022-12-15 ENCOUNTER — Ambulatory Visit: Payer: Self-pay | Admitting: Family Medicine

## 2022-12-18 NOTE — Progress Notes (Unsigned)
    SUBJECTIVE:   CHIEF COMPLAINT / HPI:   Seen 1/4 by Dr Gwendolyn Lima for wellness visit and  "Weight gain Assessment & Plan: Weight gain most likely due to excess calories. Less likely thyroid disease or endocrine disorder. Wellbutrin not a good option as patient endorses worsening anxiety as well. Metformin also not a good option as A1c is 4.9. Patient would like to hold off on any GLP 1 at this time.  - SMART goal 15-20 minute HIIT workout 1-2 times a week for 4 weeks.  - F/u in 4 weeks    Orders: -     POCT glycosylated hemoglobin (Hb A1C) -     TSH Rfx on Abnormal to Free T4"  PERTINENT  PMH / PSH: *** Patient Active Problem List   Diagnosis Date Noted   Situational anxiety 07/20/2021   Lipoma 05/03/2021   Decreased libido 02/15/2021   Weight gain 08/25/2020   Depression 01/17/2018   Migraine without aura 01/31/2007    Current Outpatient Medications  Medication Instructions   ibuprofen (ADVIL) 800 mg, Oral, Every 8 hours PRN   norelgestromin-ethinyl estradiol Marilu Favre) 150-35 MCG/24HR transdermal patch 1 patch, Transdermal, Weekly       11/16/2022    3:19 PM 09/12/2022   11:19 AM 08/22/2022   10:32 AM  Vitals with BMI  Height '5\' 9"'$     Weight 260 lbs 10 oz    BMI 63.78    Systolic [phone]  Diastolic 80 72 76  Pulse 83        OBJECTIVE:   There were no vitals taken for this visit.  ***  ASSESSMENT/PLAN:   There are no diagnoses linked to this encounter.   There are no Patient Instructions on file for this visit.   Lind Covert, MD [place]

## 2022-12-19 ENCOUNTER — Encounter: Payer: Self-pay | Admitting: Family Medicine

## 2022-12-19 ENCOUNTER — Ambulatory Visit (INDEPENDENT_AMBULATORY_CARE_PROVIDER_SITE_OTHER): Payer: BC Managed Care – PPO | Admitting: Family Medicine

## 2022-12-19 ENCOUNTER — Other Ambulatory Visit: Payer: Self-pay

## 2022-12-19 VITALS — BP 127/69 | HR 92 | Ht 69.0 in | Wt 264.2 lb

## 2022-12-19 DIAGNOSIS — L290 Pruritus ani: Secondary | ICD-10-CM | POA: Diagnosis not present

## 2022-12-19 DIAGNOSIS — E669 Obesity, unspecified: Secondary | ICD-10-CM | POA: Diagnosis not present

## 2022-12-19 MED ORDER — SEMAGLUTIDE(0.25 OR 0.5MG/DOS) 2 MG/3ML ~~LOC~~ SOPN: 0.2500 mg | PEN_INJECTOR | SUBCUTANEOUS | 3 refills | Status: DC

## 2022-12-19 NOTE — Patient Instructions (Addendum)
Good to see you today - Thank you for coming in  Things we discussed today:  I think your rectal itching is due to small hemorrhoids.  Use hydrocortisone 1% ointment or preparation H as needed.  Let me know if worsening.  Also notice if certain foods make it worse   Weight Continue exercising this is very important to keep in shape I put in a referral to nutrition - Call Dr Jenne Campus at [phone] to make an appointment   I will try to order a GLP 1 like ozempic to see if this is paid for

## 2022-12-19 NOTE — Assessment & Plan Note (Signed)
Unchanged.  Will refer to Dr Jenne Campus nutrition.  Asked her to keep a food diary for 3 days before.  Will prescribe ozempic but let her know I doubt will be covered.

## 2022-12-19 NOTE — Assessment & Plan Note (Signed)
Consistent with pruritus ani or mild hemorrhoids.  No signs of infection or masses.  Treat with HC or preparation H.

## 2022-12-29 ENCOUNTER — Telehealth: Payer: Self-pay

## 2022-12-29 NOTE — Telephone Encounter (Signed)
A Prior Authorization was initiated for this patients OZEMPIC through CoverMyMeds.   Key: [id number]

## 2023-01-01 ENCOUNTER — Encounter: Payer: Self-pay | Admitting: Family Medicine

## 2023-01-01 DIAGNOSIS — E669 Obesity, unspecified: Secondary | ICD-10-CM

## 2023-01-01 NOTE — Telephone Encounter (Signed)
Prior Auth for patients medication OZEMPIC denied by [facility] via CoverMyMeds.   Reason: the requested service is not a covered benefit per plan.   This health benefit plan does not cover the following services, supplies, drugs or charges: Any treatment or regimen, medical or surgical, for the purpose of reducing or controlling the weight of the member, or for the treatment of obesity, except for surgical treatment of morbid obesity, or as specifically covered by this health benefit plan.  CoverMyMeds Key: [id number]

## 2023-01-02 DIAGNOSIS — F411 Generalized anxiety disorder: Secondary | ICD-10-CM | POA: Diagnosis not present

## 2023-01-16 ENCOUNTER — Ambulatory Visit: Payer: BC Managed Care – PPO | Admitting: Family Medicine

## 2023-01-17 DIAGNOSIS — F411 Generalized anxiety disorder: Secondary | ICD-10-CM | POA: Diagnosis not present

## 2023-01-29 DIAGNOSIS — F411 Generalized anxiety disorder: Secondary | ICD-10-CM | POA: Diagnosis not present

## 2023-01-30 ENCOUNTER — Other Ambulatory Visit: Payer: Self-pay | Admitting: Family Medicine

## 2023-01-30 DIAGNOSIS — E669 Obesity, unspecified: Secondary | ICD-10-CM

## 2023-01-30 MED ORDER — WEGOVY 0.25 MG/0.5ML ~~LOC~~ SOAJ: 0.2500 mg | SUBCUTANEOUS | 1 refills | Status: DC

## 2023-02-09 ENCOUNTER — Ambulatory Visit: Admit: 2023-02-09 | Discharge status: Against Medical Advice | Payer: BC Managed Care – PPO

## 2023-02-09 DIAGNOSIS — S99912A Unspecified injury of left ankle, initial encounter: Secondary | ICD-10-CM | POA: Diagnosis not present

## 2023-02-09 DIAGNOSIS — S93402A Sprain of unspecified ligament of left ankle, initial encounter: Secondary | ICD-10-CM | POA: Diagnosis not present

## 2023-02-09 DIAGNOSIS — Z3202 Encounter for pregnancy test, result negative: Secondary | ICD-10-CM | POA: Diagnosis not present

## 2023-04-13 ENCOUNTER — Encounter: Payer: Self-pay | Admitting: Radiology

## 2023-04-13 ENCOUNTER — Encounter: Payer: Self-pay | Admitting: Family Medicine

## 2023-04-13 ENCOUNTER — Ambulatory Visit (INDEPENDENT_AMBULATORY_CARE_PROVIDER_SITE_OTHER): Payer: BC Managed Care – PPO | Admitting: Radiology

## 2023-04-13 VITALS — BP 114/78 | Ht 69.0 in | Wt 275.0 lb

## 2023-04-13 DIAGNOSIS — Z01419 Encounter for gynecological examination (general) (routine) without abnormal findings: Secondary | ICD-10-CM

## 2023-04-13 NOTE — Progress Notes (Signed)
   Anita [name] 29-Jul-1996 [id number]   History:  27 y.o. G0 presents for annual exam. No new concerns. Struggles with a recurrent bartholin's cyst which has been surgically marsupialized in the past, no problems with it currently. Graduated with her MSW, starts a new job next month.   Gynecologic History Patient's last menstrual period was 04/08/2023 (exact date). Period Cycle (Days): 28 Period Duration (Days): 4 Period Pattern: Regular Menstrual Flow: Moderate Menstrual Control: Tampon, Maxi pad, Thin pad Dysmenorrhea: None Contraception/Family planning: coitus interruptus Sexually active: yes Last Pap: 2023. Results were: normal   Obstetric History OB History  Gravida Para Term Preterm AB Living  0 0 0 0 0 0  SAB IAB Ectopic Multiple Live Births  0 0 0 0 0     The following portions of the patient's history were reviewed and updated as appropriate: allergies, current medications, past family history, past medical history, past social history, past surgical history, and problem list.  Review of Systems Pertinent items noted in HPI and remainder of comprehensive ROS otherwise negative.   Past medical history, past surgical history, family history and social history were all reviewed and documented in the EPIC chart.   Exam:  Vitals:   04/13/23 1123  BP: 114/78  Weight: 275 lb (124.7 kg)  Height: 5\' 9"  (1.753 m)   Body mass index is 40.61 kg/m.  General appearance:  Normal Thyroid:  Symmetrical, normal in size, without palpable masses or nodularity. Respiratory  Auscultation:  Clear without wheezing or rhonchi Cardiovascular  Auscultation:  Regular rate, without rubs, murmurs or gallops  Edema/varicosities:  Not grossly evident Abdominal  Soft,nontender, without masses, guarding or rebound.  Liver/spleen:  No organomegaly noted  Hernia:  None appreciated  Skin  Inspection:  Grossly normal Breasts: Examined lying and sitting.   Right: Without masses,  retractions, nipple discharge or axillary adenopathy.   Left: Without masses, retractions, nipple discharge or axillary adenopathy. Genitourinary   Inguinal/mons:  Normal without inguinal adenopathy  External genitalia:  Normal appearing vulva with no masses, tenderness, or lesions  BUS/Urethra/Skene's glands:  Normal without masses or exudate  Vagina:  Normal appearing with normal color and discharge, no lesions  Cervix:  Normal appearing without discharge or lesions  Uterus:  Normal in size, shape and contour.  Mobile, nontender  Adnexa/parametria:     Rt: Normal in size, without masses or tenderness.   Lt: Normal in size, without masses or tenderness.  Anus and perineum: Normal   Raynelle Fanning, CMA present for exam  Assessment/Plan:   1. Well woman exam with routine gynecological exam Pap due 2026 Will continue to monitor vaginal cyst      Discussed SBE, colonoscopy and DEXA screening as directed/appropriate. Recommend of exercise weekly, including weight bearing exercise. Encouraged the use of seatbelts and sunscreen. Return in 1 year for annual or as needed.   Tanda Rockers WHNP-BC 11:39 AM 04/13/2023

## 2023-04-19 ENCOUNTER — Ambulatory Visit: Payer: BC Managed Care – PPO | Admitting: Nurse Practitioner

## 2023-04-26 NOTE — Telephone Encounter (Signed)
PA for [facility] submitted through covermymeds.   Key: [id number]

## 2023-04-27 DIAGNOSIS — F411 Generalized anxiety disorder: Secondary | ICD-10-CM | POA: Diagnosis not present

## 2023-05-09 NOTE — Telephone Encounter (Signed)
Will route to provider for final review and close.  

## 2023-05-11 ENCOUNTER — Ambulatory Visit (INDEPENDENT_AMBULATORY_CARE_PROVIDER_SITE_OTHER): Payer: BC Managed Care – PPO | Admitting: Student

## 2023-05-11 VITALS — BP 118/60 | HR 76 | Ht 69.0 in | Wt 279.0 lb

## 2023-05-11 DIAGNOSIS — Z3201 Encounter for pregnancy test, result positive: Secondary | ICD-10-CM

## 2023-05-11 DIAGNOSIS — Z3200 Encounter for pregnancy test, result unknown: Secondary | ICD-10-CM | POA: Diagnosis not present

## 2023-05-11 DIAGNOSIS — Z3A01 Less than 8 weeks gestation of pregnancy: Secondary | ICD-10-CM | POA: Diagnosis not present

## 2023-05-11 DIAGNOSIS — Z7689 Persons encountering health services in other specified circumstances: Secondary | ICD-10-CM

## 2023-05-11 LAB — POCT URINE PREGNANCY: Preg Test, Ur: POSITIVE — AB

## 2023-05-11 NOTE — Patient Instructions (Addendum)
It was great to see you! Thank you for allowing me to participate in your care!  Our plans for today:  - Schedule an appointment in the next 4 weeks for follow up - If you have vaginal bleeding, abdominal pain, please return sooner or go to the MAU -No more ibuprofen, can take tylenol if needed -Start taking an over the counter pre-natal vitamin daily   Take care and seek immediate care sooner if you develop any concerns.   Dr. Erick Alley, DO [facility]

## 2023-05-11 NOTE — Progress Notes (Unsigned)
    SUBJECTIVE:   CHIEF COMPLAINT / HPI:   Positive home pregnancy test Tested positive 2 days ago and positive in office today. LMP: 04/08/2023 Periods occur monthly - never irregular  No vaginal bleeding No abdominal pain No N/V Not on [facility] since Dec 2023 One partner who is her boyfriend, she feels safe in her relationship Pregnancy not prevented but not planned She feels nervous and anxious about pregnancy, really wanted to loose more weight before becoming pregnant Think she will ultimately want to keep this pregnancy but would like some time to consider her options  PERTINENT  PMH / PSH: Obesity  OBJECTIVE:   BP 118/60   Pulse 76   Ht 5\' 9"  (1.753 m)   Wt 279 lb (126.6 kg)   LMP 04/08/2023 (Exact Date)   SpO2 100%   BMI 41.20 kg/m    General: NAD, pleasant, able to participate in exam Cardiac: Well-perfused Respiratory: Breathing comfortably on room air Skin: warm and dry Neuro: alert, no obvious focal deficits Psych: Mood and affect appropriate for situation ASSESSMENT/PLAN:   Less than [redacted] weeks gestation of pregnancy Based on LMP is [redacted]w[redacted]d pregnant.  She would like some time to think about her options although she thinks she will proceed with the pregnancy.  Discussed 12 weeks is the cutoff for abortions in [place].  A lot of her anxiety is due to wanting to lose weight and knowing she will gain weight in pregnancy.  She declined initial OB labs today.  -Discussed healthy lifestyle and weight gain goals during pregnancy and provided handout.   -Handout on first trimester pregnancy provided -Patient to return in about 4 weeks for initial OB visit if she decides to continue with pregnancy. Can return sooner to further discuss if desired or if she decides to terminate pregnancy.  -Only medication is ibuprofen, advised to stop taking while pregnant, can take tylenol if needed      Dr. Erick Alley, DO [place] [facility]

## 2023-05-13 ENCOUNTER — Encounter: Payer: Self-pay | Admitting: Student

## 2023-05-13 DIAGNOSIS — Z3A01 Less than 8 weeks gestation of pregnancy: Secondary | ICD-10-CM | POA: Insufficient documentation

## 2023-05-13 NOTE — Assessment & Plan Note (Addendum)
Based on LMP is [redacted]w[redacted]d pregnant.  She would like some time to think about her options although she thinks she will proceed with the pregnancy.  Discussed 12 weeks is the cutoff for abortions in [place].  A lot of her anxiety is due to wanting to lose weight and knowing she will gain weight in pregnancy.  She declined initial OB labs today.  -Discussed healthy lifestyle and weight gain goals during pregnancy and provided handout.   -Handout on first trimester pregnancy provided -Patient to return in about 4 weeks for initial OB visit if she decides to continue with pregnancy. Can return sooner to further discuss if desired or if she decides to terminate pregnancy.  -Only medication is ibuprofen, advised to stop taking while pregnant, can take tylenol if needed

## 2023-05-26 DIAGNOSIS — F411 Generalized anxiety disorder: Secondary | ICD-10-CM | POA: Diagnosis not present

## 2023-05-28 ENCOUNTER — Encounter: Payer: BC Managed Care – PPO | Admitting: Obstetrics

## 2023-06-08 ENCOUNTER — Ambulatory Visit (INDEPENDENT_AMBULATORY_CARE_PROVIDER_SITE_OTHER): Payer: BC Managed Care – PPO | Admitting: Student

## 2023-06-08 ENCOUNTER — Other Ambulatory Visit (HOSPITAL_COMMUNITY)
Admission: RE | Admit: 2023-06-08 | Discharge: 2023-06-08 | Disposition: A | Discharge status: Home / Self Care | Payer: BC Managed Care – PPO | Source: Ambulatory Visit | Attending: Family Medicine | Admitting: Family Medicine

## 2023-06-08 ENCOUNTER — Encounter: Payer: Self-pay | Admitting: Student

## 2023-06-08 VITALS — BP 121/65 | HR 69 | Wt 282.0 lb

## 2023-06-08 DIAGNOSIS — Z3490 Encounter for supervision of normal pregnancy, unspecified, unspecified trimester: Secondary | ICD-10-CM

## 2023-06-08 DIAGNOSIS — R112 Nausea with vomiting, unspecified: Secondary | ICD-10-CM

## 2023-06-08 MED ORDER — DOXYLAMINE-PYRIDOXINE 10-10 MG PO TBEC
2.0000 | DELAYED_RELEASE_TABLET | Freq: Every evening | ORAL | 1 refills | Status: DC | PRN
Start: 2023-06-08 — End: 2023-07-06

## 2023-06-08 NOTE — Progress Notes (Signed)
Patient Name: Anita [name] Date of Birth: 1996/06/28 [facility] Initial Prenatal Visit  MERRIAH ANKRUM is a 27 y.o. year old G1P0000 at [redacted]w[redacted]d who presents for her initial prenatal visit. Pregnancy is not planned.  She is accompanied by her boyfriend Denyse Amass today.  They are feeling nervous about pregnancy She reports morning sickness.  Is vomiting about 2-3 times per day.  States she is able to keep most food and water down and feels she is hydrated. She is concerned about weight gain during pregnancy, does not want to gain too much and wants to be healthy. She is taking a prenatal vitamin.  She denies vaginal bleeding. Does admit to some cramping.    Pregnancy Dating: The patient is dated by LMP.  LMP: 04/08/2023 Period is certain:  Yes.  Periods were regular:  Yes.  LMP was a typical period:  Yes.  Using hormonal contraception in 3 months prior to conception: No  Labs: Declined labs at previous as she wanted time to consider her options in terms of keeping versus terminating pregnancy.  Will get initial OB labs today.  PMH: Reviewed and as detailed below: HTN: No  Gestational Hypertension/preeclampsia: No  Type 1 or 2 Diabetes: No  Depression:  Yes  Seizure disorder:  No VTE: No ,  History of STI No,  Abnormal Pap smear:  No, Genital herpes simplex:  No   PSH: Gynecologic Surgery: Bartholin cyst marsupialization in 2021 Surgical history reviewed, notable for: Appendectomy in middle school   Obstetric History: Obstetric history tab updated and reviewed.  Summary of prior pregnancies: No prior pregnancies  Social History: Partner's name: Kandee Keen  Tobacco use: No Alcohol use:  Occasionally before pregnancy  Other substance use:  No  Current Medications:  Melatonin -discussed risks and benefits.  It does cross the placenta, there is no evidence that it is harmful to the fetus but there is limited data.  Take only if absolutely needed. Reviewed and  appropriate in pregnancy.   Genetic and Infection Screen: Flow Sheet Updated Yes  Prenatal Exam: Gen: Well nourished, well developed.  No distress.  Vitals noted. HEENT: Normocephalic, atraumatic.  Neck supple CV: RRR no murmur, gallops or rubs Lungs: Breathing comfortably on room air Abd: soft, NTND. GU: Chaperoned by CMA.  Normal external female genitalia without lesions.  Nl vaginal, well rugated without lesions.  Normal amount of clear/white discharge.  Bimanual exam: No adnexal mass or TTP. No CMT.   Ext:  mild nonpitting edema of BLEs. Psych: Normal grooming and dress.  Not depressed or anxious appearing.  Normal thought content and process without flight of ideas or looseness of associations  Fetal heart tones:  Did not attempt hear fetal heart tones as she is less than 10 weeks*  Assessment/Plan:  JANUITA NISH is a 27 y.o. G1P0000 at Unknown who presents to initiate prenatal care. She is doing well.    Routine prenatal care: As dating is reliable, a dating ultrasound has not been ordered. Dating tab updated. Pre-pregnancy weight updated. Expected weight gain this pregnancy is 11-20 pounds  Prenatal labs drawn today.  Lab did not collect urine.  Patient advised to return for lab only visit. Indications for referral to [facility] were reviewed and the patient does not meet criteria for referral.  Medication list reviewed and updated.  Recommended patient see a dentist for regular care.  Bleeding and pain precautions reviewed. Importance of prenatal vitamins reviewed.  Genetic screening offered. Patient opted for: patient  undecided, will address at future visit. The patient has the following indications for aspirinto begin 81 mg at 12-16 weeks: One high risk condition: no single high risk condition  MORE than one moderate risk condition: nulliparity and obesity Aspirin was  recommended today based upon above risk factors (one high risk condition or more than one moderate risk  factor)  The patient will not be age 27 or over at time of delivery. Referral to genetic counseling was not offered today.  The patient has the following risk factors for preexisting diabetes: Reviewed indications for early 1 hour glucose testing, not indicated . An early 1 hour glucose tolerance test was not ordered.  A1c 6 months ago 4.9.   2. Pregnancy issues include the following which were addressed today:  Nausea and vomiting-doxylamine pyridoxine sent to pharmacy   Follow up 4 weeks for next prenatal visit.

## 2023-06-08 NOTE — Patient Instructions (Addendum)
It was great to see you! Thank you for allowing me to participate in your care!  Our plans for today:  -Foods to avoid while pregnant: Soft unpasteurized cheeses, undercooked meat or fish and seafood, unpasteurized milk, undercooked or raw eggs.  Avoid smoking and drinking alcohol. -Continue prenatal vitamin -I sent in a prescription for an antinausea medication called doxylamine-pyridoxine.  Follow instructions on label. -You will need to start taking aspirin 81 mg daily starting at 12 weeks to help prevent preeclampsia.  You are 8 weeks and 5 days today. -Return in 4 weeks for next OB visit -If you develop any vaginal bleeding, severe abdominal pain, leaking fluid, go to the MAU  We are checking some labs today, I will call you if they are abnormal will send you a MyChart message or a letter if they are normal.  If you do not hear about your labs in the next 2 weeks please let us know.  Take care and seek immediate care sooner if you develop any concerns.   Dr. Erick Alley, DO [facility]

## 2023-06-12 ENCOUNTER — Encounter: Payer: Self-pay | Admitting: Student

## 2023-06-13 ENCOUNTER — Other Ambulatory Visit: Payer: Self-pay | Admitting: Student

## 2023-06-13 DIAGNOSIS — Z3490 Encounter for supervision of normal pregnancy, unspecified, unspecified trimester: Secondary | ICD-10-CM

## 2023-06-15 ENCOUNTER — Other Ambulatory Visit (INDEPENDENT_AMBULATORY_CARE_PROVIDER_SITE_OTHER): Payer: BC Managed Care – PPO

## 2023-06-15 DIAGNOSIS — Z3490 Encounter for supervision of normal pregnancy, unspecified, unspecified trimester: Secondary | ICD-10-CM | POA: Diagnosis not present

## 2023-06-15 LAB — POCT UA - MICROSCOPIC ONLY: WBC, Ur, HPF, POC: NONE SEEN (ref 0–5)

## 2023-06-15 LAB — POCT URINALYSIS DIP (MANUAL ENTRY)
Bilirubin, UA: NEGATIVE
Glucose, UA: NEGATIVE mg/dL
Ketones, POC UA: NEGATIVE mg/dL
Leukocytes, UA: NEGATIVE
Nitrite, UA: NEGATIVE
Protein Ur, POC: NEGATIVE mg/dL
Spec Grav, UA: 1.02 (ref 1.010–1.025)
Urobilinogen, UA: 0.2 E.U./dL
pH, UA: 6 (ref 5.0–8.0)

## 2023-06-22 ENCOUNTER — Other Ambulatory Visit: Payer: BC Managed Care – PPO

## 2023-06-30 LAB — PANORAMA PRENATAL TEST FULL PANEL:PANORAMA TEST PLUS 5 ADDITIONAL MICRODELETIONS

## 2023-07-02 ENCOUNTER — Encounter: Payer: Self-pay | Admitting: Student

## 2023-07-05 ENCOUNTER — Encounter: Payer: Self-pay | Admitting: Student

## 2023-07-06 ENCOUNTER — Encounter: Payer: Self-pay | Admitting: Student

## 2023-07-06 ENCOUNTER — Ambulatory Visit (INDEPENDENT_AMBULATORY_CARE_PROVIDER_SITE_OTHER): Payer: BC Managed Care – PPO | Admitting: Student

## 2023-07-06 VITALS — BP 120/70 | HR 68 | Wt 282.0 lb

## 2023-07-06 DIAGNOSIS — Z3400 Encounter for supervision of normal first pregnancy, unspecified trimester: Secondary | ICD-10-CM | POA: Insufficient documentation

## 2023-07-06 DIAGNOSIS — Z3490 Encounter for supervision of normal pregnancy, unspecified, unspecified trimester: Secondary | ICD-10-CM | POA: Insufficient documentation

## 2023-07-06 DIAGNOSIS — Z3A12 12 weeks gestation of pregnancy: Secondary | ICD-10-CM

## 2023-07-06 MED ORDER — ASPIRIN 81 MG PO TBEC
81.0000 mg | DELAYED_RELEASE_TABLET | Freq: Every day | ORAL | 12 refills | Status: DC
Start: 2023-07-06 — End: 2024-01-12

## 2023-07-06 NOTE — Progress Notes (Signed)
  Patient Name: Anita [name] Date of Birth: 12-12-1995 [facility] Prenatal Visit  Anita [name] is a 27 y.o. G1P0000 at [redacted]w[redacted]d here for routine follow up. She is dated by LMP.  She reports no complaints.  She denies vaginal bleeding.  See flow sheet for details.  Vitals:   07/06/23 1449  BP: 120/70  Pulse: 68     A/P: Pregnancy at [redacted]w[redacted]d.  Doing well.    Routine Prenatal Care:  Dating reviewed, dating tab is correct Fetal heart tones Appropriate -- 135 Influenza vaccine not administered as not influenza season.   COVID vaccination was discussed and not given as we do not have in clinic, not season.  The patient has the following indication for screening preexisting diabetes: BMI > 25 and sedentary lifestyle. Anatomy ultrasound needs to be scheduled at next visit for 18-20 weeks. Patient had PANORAMA & Horizon 4 -- LR female, negative SMA, CF, Fragile X, DMD.  Pregnancy education including expected weight gain in pregnancy, OTC medication use, continued use of prenatal vitamin, smoking cessation if applicable, and nutrition in pregnancy.   Bleeding and pain precautions reviewed. ASA previously recommended   2. Pregnancy issues include the following and were addressed as appropriate today:  Continue ASA Evaluated fetus with POCUS, patient understood that this was an informal and unofficial ultrasound screen.  Needs anatomy ultrasound scheduled at next visit between 18-20 weeks Qualifies for 1 hr GTT Problem list  and pregnancy box updated: Yes.   Follow up 4 weeks.

## 2023-07-06 NOTE — Patient Instructions (Signed)
It was great to see you today! Thank you for choosing [facility] for your obstetric care. Anita [name] was seen for OB visit.   Baby is doing well, great job!! We will need an anatomy ultrasound of baby in the coming weeks, but we will schedule this. Sometimes mother's need aspirin in pregnancy for various reasons to keep baby safe.   Commonly Asked Questions During Pregnancy  How Will I Feel When I'm Pregnant? Pregnancy symptoms in the first trimester of pregnancy may not appear until the middle or end of the second month. Hormonal changes will cause tenderness in your breasts, and you may begin to feel more tired than usual. Food cravings, an increase in the need to urinate, and morning sickness may all be more noticeable.  Pregnancy symptoms in the second trimester are more prominent. You may start to feel the baby move and become more active. Dental issues, nasal/sinus problems, and skin irritations can begin to appear. Heartburn, leg cramps, dizziness, and a vaginal discharge are also common. Every woman is different when it comes to the symptoms they experience, and some may not experience any at all. Pregnancy symptoms in the third trimester can include increased frequency in urination, leg cramps, constipation, ligament pain in the abdomen, and weight gain. Back pain and Braxton Hicks contractions will become increasingly more common.  Why is nutrition during pregnancy important? Eating well is one of the best things you can do during pregnancy. Good nutrition helps you handle the extra demands on your body as your pregnancy progresses. The goal is to balance getting enough nutrients to support the growth of your fetus and maintaining a healthy weight.  How much water should I drink during pregnancy? During pregnancy you should drink 8 to 12 cups (64 to 96 ounces) of water every day. Water has many benefits. It aids digestion and helps form the amniotic fluid around the fetus.  Water also helps nutrients circulate in the body and helps waste leave the body.  What can I do to help with nausea? Eat dry toast or crackers in the morning before you get out of bed to avoid moving around on an empty stomach. Eat five or six "mini meals" a day to ensure that your stomach is never empty. Eat frequent bites of foods like nuts, fruits, or crackers.  What can help with constipation during pregnancy? Constipation is common near the end of pregnancy. Eating more foods with fiber can help fight constipation. Fiber is found in fruits, vegetables, whole grains, beans, nuts, and seeds. You should aim for about 25 grams of fiber in your diet each day. Drink a lot of water as you increase your fiber intake.  How much coffee can I drink while I'm pregnant? Research suggests that moderate caffeine consumption (less than 200 milligrams per day) does not cause miscarriage or preterm birth. That's the amount in one 12-ounce cup of coffee. Remember that caffeine also is found in tea, chocolate, energy drinks, and soft drinks. Caffeine can interfere with sleep and contribute to nausea and light-headedness. Caffeine also can increase urination and lead to dehydration.  What can I do to prevent or ease back pain during pregnancy? There are several things you can do to prevent or ease back pain. For example, wear supportive clothing and shoes. Pay attention to your position when sitting, sleeping, and lifting things. If you need to stand for a long time, rest one foot on a stool or a box to take the  strain off your back. You also can use heat or cold to soothe sore muscles.  Is it safe to exercise during pregnancy? If you are healthy and your pregnancy is normal, it is safe to continue or start regular physical activity. Physical activity does not increase your risk of miscarriage, low birth weight, or early delivery. It's still important to discuss exercise with your ob-gyn provider during your  early prenatal visits.   What are the benefits of exercise during pregnancy? Regular exercise during pregnancy benefits you and your fetus in these key ways: Reduces back pain Eases constipation May decrease your risk of gestational diabetes, preeclampsia, and cesarean birth Promotes healthy weight gain during pregnancy Improves your overall fitness and strengthens your heart and blood vessels Helps you to lose the baby weight after your baby is born  Is it safe to dye my hair during pregnancy? Yes, it's safe. Only a small amount of chemicals from hair dye is absorbed through the scalp.  Is it safe to keep a cat during pregnancy? Yes, you can keep your cat. You may have heard that cat feces can carry the infection toxoplasmosis. This infection is only found in cats who go outdoors and hunt prey, such as mice and other rodents. If you do have a cat who goes outdoors or eats prey, have someone else take over daily cleaning the litter box. This will keep you away from any cat feces. If you have an indoor cat who only eats cat food and doesn't have contact with outside animals, your risk of toxoplasmosis is very low.  What substances should I avoid during pregnancy? During pregnancy, women should not use tobacco, alcohol, marijuana, illegal drugs, or prescription medications for nonmedical reasons. Avoiding these substances and getting regular prenatal care are important to having a healthy pregnancy and a healthy baby.   What foods do I need to avoid in pregnancy? To help prevent listeriosis, avoid eating the following foods while you are pregnant: Unpasteurized milk and foods made with unpasteurized milk, including soft cheeses Hot dogs and luncheon meats, unless they are heated until steaming hot just before serving Unwashed raw produce such as fruits and vegetables  Avoid all raw and undercooked seafood, eggs, meat, and poultry while you are pregnant. Do not eat sushi made with raw fish  (cooked sushi is safe). Cooking and pasteurization are the only ways to kill Listeria.  Limit your exposure to mercury by not eating bigeye tuna, king mackerel, marlin, orange roughy, shark, swordfish, or tilefish. Limit eating white (albacore) tuna to 6 ounces a week. You do not have to avoid all fish during pregnancy. In fact, fish and shellfish are nutritious foods with vital nutrients for a pregnant woman and her fetus. Be sure to eat at least 8-12 ounces of low-mercury fish and shellfish per week.  Is travel safe to do during pregnancy? In most cases, pregnant women can travel safely until close to their due dates. But travel may not be recommended for women who have pregnancy complications. If you are planning a trip, talk with your (ob-gyn) provider. And no matter how you choose to travel, think ahead about your comfort and safety.  Can I use a sauna or hot tub early in pregnancy? It's best not to. Your core body temperature rises when you use saunas and hot tubs. This rise in temperature can be harmful for your fetus.  Can I get a massage while pregnant? Yes. Massage is a good way to relax and improve  circulation. The best position for a massage while you're pregnant is lying on your side, rather than facedown. Some massage tables have a cut-out for the belly, allowing you to lie facedown comfortably. Tell your massage therapist that you're pregnant if you're not showing yet. Many health spas offer special prenatal massages done by therapists who are trained to work on pregnant women.  Is Having Dental Work While Pregnant Safe? Pregnancy and dental work questions are common for expecting moms. Preventive dental cleanings and annual exams during pregnancy are not only safe but are recommended. The rise in hormone levels during pregnancy causes the gums to swell, bleed, and trap food causing increased irritation to your gums. Preventive dental work while pregnant is essential to avoid oral  infections such as gum disease, which has been linked to preterm birth. The American Dental Association (ADA) recommends pregnant women eat a balanced diet, brush their teeth thoroughly with ADA-approved fluoride toothpaste twice a day, and floss daily. Have preventive exams and cleanings during your pregnancy. Let your dentist know you are pregnant. Postpone non-emergency dental work until the second trimester or after delivery, if possible. Elective procedures should be postponed until after the delivery.  If you haven't already, sign up for My Chart to have easy access to your labs results, and communication with your primary care physician.  We are checking some labs today. If they are abnormal, I will call you. If they are normal, I will send you a MyChart message (if it is active) or a letter in the mail. If you do not hear about your labs in the next 2 weeks, please call the office.   You should return to our clinic No follow-ups on file.  I recommend that you always bring your medications to each appointment as this makes it easy to ensure you are on the correct medications and helps [place] not miss refills when you need them.   Please arrive 15 minutes before your appointment to ensure smooth check in process.  We appreciate your efforts in making this happen.  Please call the clinic at [phone] if your symptoms worsen or you have any concerns.  Thank you for allowing me to participate in your care, Alfredo Martinez, MD 07/06/2023, 3:23 PM PGY-3, [facility]

## 2023-07-09 ENCOUNTER — Encounter: Payer: Self-pay | Admitting: Student

## 2023-07-25 NOTE — Telephone Encounter (Signed)
Patient calls nurse line in regards to continued lower back and lower pelvis pain.   She reports she went on a walk yesterday and felt the pain did not go away with rest as usual.   She denies any abdominal pain or vaginal bleeding.  Patient advised a clinic visit would be best to evaluate the ongoing pain at this point. She reports she will call me back tomorrow to schedule once she knows her work schedule.   She would like a call from [facility] provider to discuss.   Advised she is on nights this week. Patient reports it is ok for her to call me after business hours.   MAU precautions discussed with patient.

## 2023-08-10 ENCOUNTER — Other Ambulatory Visit: Payer: Self-pay

## 2023-08-10 ENCOUNTER — Ambulatory Visit (INDEPENDENT_AMBULATORY_CARE_PROVIDER_SITE_OTHER): Payer: Managed Care, Other (non HMO) | Admitting: Student

## 2023-08-10 ENCOUNTER — Telehealth: Payer: Self-pay | Admitting: Student

## 2023-08-10 VITALS — BP 128/64 | HR 89 | Wt 287.0 lb

## 2023-08-10 DIAGNOSIS — Z3A17 17 weeks gestation of pregnancy: Secondary | ICD-10-CM | POA: Diagnosis not present

## 2023-08-10 DIAGNOSIS — Z23 Encounter for immunization: Secondary | ICD-10-CM

## 2023-08-10 LAB — POCT 1 HR PRENATAL GLUCOSE: Glucose 1 Hr Prenatal, POC: 150 mg/dL

## 2023-08-10 NOTE — Telephone Encounter (Signed)
Called patient to discuss failed 1 hour GTT today.  Future lab orders are already in place for fasting CBG and a repeat GTT. answered all questions.  Patient agrees to call the office Monday morning to schedule a lab only visit which will need to be scheduled early in the morning as she will be fasting.

## 2023-08-10 NOTE — Patient Instructions (Addendum)
Please make a follow up appointment in 4 weeks. Continue aspirin and prenatal vitamin daily If diarrhea worsens or other symptoms develop, please return for further evaluation I will be in touch with glucose test results  I will look into [facility]  Prenatal Classes Go to [email or web address] for more information on the pregnancy and child birth classes that [place] has to offer.   Pregnancy Related Return Precautions The follow are signs/symptoms that are abnormal in pregnancy and may require further evaluation by a physician: Go to the MAU at [facility] if: You have cramping/contractions that do not go away with drinking water, especially if they are lasting 30 seconds to 1.5 minutes, coming and going every 5-10 minutes for an hour or more, or are getting stronger and you cannot walk or talk while having a contraction/cramp. Your water breaks.  Sometimes it is a big gush of fluid, sometimes it is just a trickle that keeps getting your underwear wet or running down your legs You have vaginal bleeding.    You do not feel your baby moving like normal.  If you do not, get something to eat and drink (something cold or something with sugar like peanut butter or juice) and lay down and focus on feeling your baby move. If your baby is still not moving like normal, you should go to MAU. You should feel your baby move 6 times in one hour, or 10 times in two hours. You have a persistent headache that does not go away with 1 g of Tylenol, vision changes, chest pain, difficulty breathing, severe pain in your right upper abdomen, worsening leg swelling- these can all be signs of high blood pressure in pregnancy and need to be evaluated by a provider immediately  These are all concerning in pregnancy and if you have any of these I recommend you call your PCP and present to the Maternity Admissions Unit (map below) for further evaluation.  For any  pregnancy-related emergencies, please go to the Maternity Admissions Unit in the [facility] at [facility]. You will use hospital Entrance C.    Our clinic number is [phone].   Dr Miquel Dunn

## 2023-08-10 NOTE — Progress Notes (Unsigned)
  Patient Name: Anita [name] Date of Birth: [birth year] [facility] Prenatal Visit  [name] is a 27 y.o. G1P0000 at [redacted]w[redacted]d here for routine follow up. She is dated by LMP.  She reports vomiting some morning but not every morning.  She denies vaginal bleeding.  See flow sheet for details.  R sided pelvic pain for past ~ 3 weeks, worse with walking, rolling over in bed, or going from sitting to standing. It is sharp, it resolves on its own quickly  Admits to diarrhea every morning for the past week. No worsening N/V. Only once in the mornings. Hasn't look to see it there is blood. No one else with diarrhea that she knows of. Otherwise feels well outside of typical pregnancy symptoms. Able to eat and drink like normal   Vitals:   08/10/23 1500  BP: 128/64  Pulse: 89     A/P: Pregnancy at [redacted]w[redacted]d.  Doing well.    Routine Prenatal Care:  Dating reviewed, dating tab is correct Fetal heart tones Appropriate - 150 Influenza vaccine administered today.   COVID vaccination was discussed and administered today.  The patient has the following indication for screening preexisting diabetes: BMI > 25 and high risk ethnicity (Latino, [place] American, Native American, [place] Islander, Asian Naval architect) . Anatomy ultrasound ordered to be scheduled at 18-20 weeks. Genetic screeening done Pregnancy education including expected weight gain in pregnancy, OTC medication use, continued use of prenatal vitamin, smoking cessation if applicable, and nutrition in pregnancy.   Bleeding and pain precautions reviewed. The patient has the following indications for aspirinto begin 81 mg at 12-16 weeks: One high risk condition: no single high risk condition  MORE than one moderate risk condition: nulliparity and low SES   Aspirin was  recommended today based upon above risk factors (one high risk condition or more than one moderate risk factor)   2. Pregnancy issues include the following and  were addressed as appropriate today:  *** Problem list  and pregnancy box updated: {yes/no:20286::"Yes"}.   Follow up 4 weeks.

## 2023-08-15 ENCOUNTER — Other Ambulatory Visit: Payer: Managed Care, Other (non HMO)

## 2023-08-15 DIAGNOSIS — Z0000 Encounter for general adult medical examination without abnormal findings: Secondary | ICD-10-CM

## 2023-08-15 DIAGNOSIS — Z3A17 17 weeks gestation of pregnancy: Secondary | ICD-10-CM

## 2023-08-15 LAB — POCT CBG (FASTING - GLUCOSE)-MANUAL ENTRY: Glucose Fasting, POC: 77 mg/dL (ref 70–99)

## 2023-08-16 LAB — GESTATIONAL GLUCOSE TOLERANCE
Glucose, Fasting: 71 mg/dL (ref 70–94)
Glucose, GTT - 1 Hour: 120 mg/dL (ref 70–179)
Glucose, GTT - 2 Hour: 93 mg/dL (ref 70–154)
Glucose, GTT - 3 Hour: 78 mg/dL (ref 70–139)

## 2023-08-23 ENCOUNTER — Encounter: Payer: Self-pay | Admitting: *Deleted

## 2023-08-23 ENCOUNTER — Other Ambulatory Visit: Payer: Self-pay | Admitting: *Deleted

## 2023-08-23 ENCOUNTER — Ambulatory Visit: Payer: Managed Care, Other (non HMO) | Attending: Family Medicine

## 2023-08-23 ENCOUNTER — Other Ambulatory Visit: Payer: Self-pay | Admitting: Family Medicine

## 2023-08-23 ENCOUNTER — Other Ambulatory Visit: Payer: Self-pay | Admitting: Student

## 2023-08-23 ENCOUNTER — Encounter: Payer: Self-pay | Admitting: Student

## 2023-08-23 ENCOUNTER — Ambulatory Visit: Payer: Managed Care, Other (non HMO) | Admitting: *Deleted

## 2023-08-23 VITALS — BP 134/62 | HR 85

## 2023-08-23 DIAGNOSIS — Z3A17 17 weeks gestation of pregnancy: Secondary | ICD-10-CM

## 2023-08-23 DIAGNOSIS — Z3A19 19 weeks gestation of pregnancy: Secondary | ICD-10-CM | POA: Insufficient documentation

## 2023-08-23 DIAGNOSIS — Z3689 Encounter for other specified antenatal screening: Secondary | ICD-10-CM

## 2023-08-23 DIAGNOSIS — O99212 Obesity complicating pregnancy, second trimester: Secondary | ICD-10-CM | POA: Diagnosis present

## 2023-08-23 DIAGNOSIS — Z363 Encounter for antenatal screening for malformations: Secondary | ICD-10-CM | POA: Insufficient documentation

## 2023-08-23 DIAGNOSIS — Z362 Encounter for other antenatal screening follow-up: Secondary | ICD-10-CM

## 2023-08-23 DIAGNOSIS — Z3490 Encounter for supervision of normal pregnancy, unspecified, unspecified trimester: Secondary | ICD-10-CM

## 2023-08-23 DIAGNOSIS — Z23 Encounter for immunization: Secondary | ICD-10-CM

## 2023-08-23 MED ORDER — PRENATAL VITAMIN 27-0.8 MG PO TABS
1.0000 | ORAL_TABLET | Freq: Every day | ORAL | 3 refills | Status: DC
Start: 2023-08-23 — End: 2023-11-13

## 2023-08-30 ENCOUNTER — Inpatient Hospital Stay (HOSPITAL_COMMUNITY)
Admission: AD | Admit: 2023-08-30 | Discharge: 2023-08-30 | Disposition: A | Discharge status: Home / Self Care | Payer: Managed Care, Other (non HMO) | Attending: Obstetrics and Gynecology | Admitting: Obstetrics and Gynecology

## 2023-08-30 ENCOUNTER — Encounter (HOSPITAL_COMMUNITY): Payer: Self-pay | Admitting: Obstetrics & Gynecology

## 2023-08-30 DIAGNOSIS — Z3A20 20 weeks gestation of pregnancy: Secondary | ICD-10-CM | POA: Insufficient documentation

## 2023-08-30 DIAGNOSIS — R109 Unspecified abdominal pain: Secondary | ICD-10-CM | POA: Insufficient documentation

## 2023-08-30 DIAGNOSIS — O26899 Other specified pregnancy related conditions, unspecified trimester: Secondary | ICD-10-CM

## 2023-08-30 DIAGNOSIS — Z0371 Encounter for suspected problem with amniotic cavity and membrane ruled out: Secondary | ICD-10-CM | POA: Diagnosis present

## 2023-08-30 LAB — WET PREP, GENITAL
Clue Cells Wet Prep HPF POC: NONE SEEN
Sperm: NONE SEEN
Trich, Wet Prep: NONE SEEN
WBC, Wet Prep HPF POC: >=10 — AB (ref <10)
Yeast Wet Prep HPF POC: NONE SEEN

## 2023-08-30 LAB — URINALYSIS, ROUTINE W REFLEX MICROSCOPIC
Bilirubin Urine: NEGATIVE
Glucose, UA: NEGATIVE mg/dL
Ketones, ur: NEGATIVE mg/dL
Leukocytes,Ua: NEGATIVE
Nitrite: NEGATIVE
Protein, ur: NEGATIVE mg/dL
Specific Gravity, Urine: 1.014 (ref 1.005–1.030)
pH: 6 (ref 5.0–8.0)

## 2023-08-30 LAB — RUPTURE OF MEMBRANE (ROM)PLUS: Rom Plus: NEGATIVE

## 2023-08-30 MED ORDER — SIMETHICONE 80 MG PO CHEW
80.0000 mg | CHEWABLE_TABLET | Freq: Once | ORAL | Status: AC
  Administered 2023-08-30: 80 mg via ORAL
  Filled 2023-08-30: qty 1

## 2023-08-30 MED ORDER — CYCLOBENZAPRINE HCL 10 MG PO TABS: 10.0000 mg | ORAL_TABLET | Freq: Three times a day (TID) | ORAL | 2 refills | Status: DC | PRN

## 2023-08-30 MED ORDER — CYCLOBENZAPRINE HCL 5 MG PO TABS
10.0000 mg | ORAL_TABLET | Freq: Once | ORAL | Status: AC
  Administered 2023-08-30: 10 mg via ORAL
  Filled 2023-08-30: qty 2

## 2023-08-30 MED ORDER — POLYETHYLENE GLYCOL 3350 17 G PO PACK: 17.0000 g | PACK | Freq: Every day | ORAL | 0 refills | Status: DC

## 2023-08-30 NOTE — MAU Provider Note (Signed)
Chief Complaint: Abdominal Pain   Event Date/Time   First Provider Initiated Contact with Patient 08/30/23 2007      SUBJECTIVE HPI: Anita [name] is a 27 y.o. G1P0000 at [redacted]w[redacted]d by LMP who presents to maternity admissions reporting LOF, random abdominal pains.  Has had sharp pains upper abdomen on both sides multiple times today. Occurs when moving or when having a bowel movement. Not related to food. Does not feel like she's been having acid reflux. Denies having any urinary symptoms, back/flank pain, pelvic pain. Does note constipation. Has had an appendectomy.  Has been having leakage of fluid/discharge last night. Denies VB, vaginal itching, odor.  Feeling flutters of fetal movement.  HPI  Past Medical History:  Diagnosis Date   Chlamydia infection    Depression 01/17/2018   Lipoma 05/03/2021   Migraine without aura 01/31/2007   Qualifier: Diagnosis of   By: Deirdre Priest MD, Alesia Richards    Past Surgical History:  Procedure Laterality Date   APPENDECTOMY     BARTHOLIN CYST MARSUPIALIZATION N/A 12/31/2019   Procedure: BARTHOLIN CYST MARSUPIALIZATION;  Surgeon: Theresia Majors, MD;  Location: [facility];  Service: Gynecology;  Laterality: N/A;   Nexplanon      Inserted 09-14-17   Social History   Socioeconomic History   Marital status: Single    Spouse name: Not on file   Number of children: Not on file   Years of education: Not on file   Highest education level: Master's degree (e.g., MA, MS, MEng, MEd, MSW, MBA)  Occupational History   Not on file  Tobacco Use   Smoking status: Never    Passive exposure: Never   Smokeless tobacco: Never  Vaping Use   Vaping status: Never Used  Substance and Sexual Activity   Alcohol use: Not Currently    Comment: occasionally   Drug use: No   Sexual activity: Yes    Partners: Male    Comment: 1ST INTERCOURSE- 53, More than 5 partners-Nexplanon inserted 09-14-17  Other Topics Concern   Not on file   Social History Narrative   Interested in being a physician would like to go to [employer]    Social Determinants of Health   Financial Resource Strain: Low Risk  (05/10/2023)   Overall Financial Resource Strain (CARDIA)    Difficulty of Paying Living Expenses: Not very hard  Food Insecurity: No Food Insecurity (05/10/2023)   Hunger Vital Sign    Worried About Running Out of Food in the Last Year: Never true    Ran Out of Food in the Last Year: Never true  Transportation Needs: No Transportation Needs (05/10/2023)   PRAPARE - Administrator, Civil Service (Medical): No    Lack of Transportation (Non-Medical): No  Physical Activity: Insufficiently Active (05/10/2023)   Exercise Vital Sign    Days of Exercise per Week: 3 days    Minutes of Exercise per Session: 30 min  Stress: Stress Concern Present (05/10/2023)   [employer] of Occupational Health - Occupational Stress Questionnaire    Feeling of Stress : Very much  Social Connections: Moderately Integrated (05/10/2023)   Social Connection and Isolation Panel [NHANES]    Frequency of Communication with Friends and Family: More than three times a week    Frequency of Social Gatherings with Friends and Family: Three times a week    Attends Religious Services: More than 4 times per year  Active Member of Clubs or Organizations: No    Attends Banker Meetings: Not on file    Marital Status: Living with partner  Intimate Partner Violence: Not on file   No current facility-administered medications on file prior to encounter.   Current Outpatient Medications on File Prior to Encounter  Medication Sig Dispense Refill   aspirin EC 81 MG tablet Take 1 tablet (81 mg total) by mouth daily. Swallow whole. 30 tablet 12   Prenatal Vit-Fe Fumarate-FA (PRENATAL VITAMIN) 27-0.8 MG TABS Take 1 tablet by mouth daily. 90 tablet 3   No Known Allergies  ROS:  Pertinent positives/negatives listed above.  I have  reviewed patient's Past Medical Hx, Surgical Hx, Family Hx, Social Hx, medications and allergies.   Physical Exam  Patient Vitals for the past 24 hrs:  BP Temp Temp src Pulse Resp SpO2 Height Weight  08/30/23 1926 (!) 127/55 98.4 F (36.9 C) Oral 81 16 100 % 5\' 9"  (1.753 m) 131.2 kg   Constitutional: Well-developed, well-nourished female in no acute distress Cardiovascular: normal rate Respiratory: normal effort GI: Abd soft, non-tender. No rebound/guarding/peritoneal signs. Pos BS x 4 MS: Extremities nontender, no edema, normal ROM Neurologic: Alert and oriented x 4   SSE: normal external genitalia. Cervix, vagina, perineum all visually within normal limits. Scant, white discharge present. No bleeding, no pooling. Cervix visually closed  Doppler: 153  LAB RESULTS Results for orders placed or performed during the hospital encounter of 08/30/23 (from the past 24 hour(s))  Urinalysis, Routine w reflex microscopic -Urine, Clean Catch     Status: Abnormal   Collection Time: 08/30/23  7:59 PM  Result Value Ref Range   Color, Urine YELLOW YELLOW   APPearance CLEAR CLEAR   Specific Gravity, Urine 1.014 1.005 - 1.030   pH 6.0 5.0 - 8.0   Glucose, UA NEGATIVE NEGATIVE mg/dL   Hgb urine dipstick SMALL (A) NEGATIVE   Bilirubin Urine NEGATIVE NEGATIVE   Ketones, ur NEGATIVE NEGATIVE mg/dL   Protein, ur NEGATIVE NEGATIVE mg/dL   Nitrite NEGATIVE NEGATIVE   Leukocytes,Ua NEGATIVE NEGATIVE   RBC / HPF 0-5 0 - 5 RBC/hpf   WBC, UA 0-5 0 - 5 WBC/hpf   Bacteria, UA RARE (A) NONE SEEN   Squamous Epithelial / HPF 0-5 0 - 5 /HPF   Mucus PRESENT   Rupture of Membrane (ROM) Plus     Status: None   Collection Time: 08/30/23  8:19 PM  Result Value Ref Range   Rom Plus NEGATIVE   Wet prep, genital     Status: Abnormal   Collection Time: 08/30/23  8:19 PM   Specimen: PATH Cytology Cervicovaginal Ancillary Only  Result Value Ref Range   Yeast Wet Prep HPF POC NONE SEEN NONE SEEN   Trich, Wet Prep  NONE SEEN NONE SEEN   Clue Cells Wet Prep HPF POC NONE SEEN NONE SEEN   WBC, Wet Prep HPF POC >=10 (A) <10   Sperm NONE SEEN     A/Weak D/-- (07/26 1658)  IMAGING [place] MFM OB DETAIL +14 WK  Result Date: 08/23/2023 ----------------------------------------------------------------------  OBSTETRICS REPORT                       (Signed Final 08/23/2023 09:05 am) ---------------------------------------------------------------------- Patient Info  ID #:       [id number]  D.O.B.:  Apr 01, 1996 (27 yrs)  Name:       DAVANNA HE                 Visit Date: 08/23/2023 07:36 am ---------------------------------------------------------------------- Performed By  Attending:        Noralee Space MD        Ref. Address:     [facility]                                                             [address] [place], [place]                                                             [address]  Performed By:     Marcellina Millin       [facility]  Referred By:      Theador Hawthorne  ENIOLA MD ---------------------------------------------------------------------- Orders  #  Description                           Code        Ordered By  1  [place] MFM OB DETAIL +14 WK               76811.01    Janit Pagan ----------------------------------------------------------------------  #  Order #                     Accession #                Episode #  1  [id number]                   [id number]                 [id number]  ---------------------------------------------------------------------- Indications  Obesity complicating pregnancy, second         O99.212  trimester (pre-G BMI 40)  Encounter for antenatal screening for          Z36.3  malformations  [redacted] weeks gestation of pregnancy                Z3A.19  LR NIPS - Female, Negative Horizon, early  3hr GTT WNL ---------------------------------------------------------------------- Vital Signs  BP:          134/62 ---------------------------------------------------------------------- Fetal Evaluation  Num Of Fetuses:         1  Fetal Heart Rate(bpm):  157  Cardiac Activity:       Observed  Presentation:           Cephalic  Placenta:               Anterior  P. Cord Insertion:      Visualized, central  Amniotic Fluid  AFI FV:      Within normal limits                              Largest Pocket(cm)                              5.44 ---------------------------------------------------------------------- Biometry  BPD:      45.5  mm     G. Age:  19w 5d         58  %    CI:        73.37   %    70 - 86                                                          FL/HC:      19.3   %    16.8 - 19.8  HC:      168.8  mm     G. Age:  19w 4d         40  %    HC/AC:      1.09        1.09 - 1.39  AC:      154.7  mm     G. Age:  20w 5d         79  %  FL/BPD:     71.4   %  FL:       32.5  mm     G. Age:  20w 1d         64  %    FL/AC:      21.0   %    20 - 24  HUM:      30.2  mm     G. Age:  20w 0d         62  %  CER:      20.2  mm     G. Age:  19w 3d         55  %  NFT:       4.6  mm  LV:        6.2  mm  CM:        5.6  mm  Est. FW:     345  gm    0 lb 12 oz      85  % ---------------------------------------------------------------------- OB History  Gravidity:    1 ---------------------------------------------------------------------- Gestational Age  LMP:           19w 4d        Date:  04/08/23                  EDD:   01/13/24  U/S Today:     20w 0d                                        EDD:   01/10/24   Best:          19w 4d     Det. By:  LMP  (04/08/23)          EDD:   01/13/24 ---------------------------------------------------------------------- Targeted Anatomy  Central Nervous System  Calvarium/Cranial V.:  Appears normal         Cereb./Vermis:          Appears normal  Cavum:                 Appears normal         Cisterna Magna:         Appears normal  Lateral Ventricles:    Appears normal         Midline Falx:           Appears normal  Choroid Plexus:        Appears normal  Spine  Cervical:              Appears normal         Sacral:                 Appears normal  Thoracic:              Appears normal         Shape/Curvature:        Appears normal  Lumbar:                Appears normal  Head/Neck  Lips:                  Appears normal         Profile:                Not well visualized  Neck:  Not well visualized    Orbits/Eyes:            Appears normal  Nuchal Fold:           Appears normal         Mandible:               Not well visualized  Nasal Bone:            Not well visualized    Maxilla:                Not well visualized  Thorax  4 Chamber View:        Not well visualized    Interventr. Septum:     Not well visualized  Cardiac Rhythm:        Normal                 Cardiac Axis:           Normal  Cardiac Situs:         Appears normal         Diaphragm:              Appears normal  Rt Outflow Tract:      Not well visualized    3 Vessel View:          Appears normal  Lt Outflow Tract:      Not well visualized    3 V Trachea View:       Not well visualized  Aortic Arch:           Not well visualized    IVC:                    Not well visualized  Ductal Arch:           Not well visualized    Crossing:               Not well visualized  SVC:                   Not well visualized  Abdomen  Ventral Wall:          Appears normal         Lt Kidney:              Appears normal  Cord Insertion:        Appears normal         Rt Kidney:              Appears normal  Situs:                  Appears normal         Bladder:                Appears normal  Stomach:               Appears normal  Extremities  Lt Humerus:            Appears normal         Lt Femur:               Appears normal  Rt Humerus:            Appears normal         Rt Femur:               Appears normal  Lt Forearm:  Appears normal         Lt Lower Leg:           Appears normal  Rt Forearm:            Appears normal         Rt Lower Leg:           Appears normal  Lt Hand:               Visualized             Lt Foot:                Nml heel/foot  Rt Hand:               Visualized             Rt Foot:                Nml heel/foot  Other  Umbilical Cord:        Normal 3-vessel        Genitalia:              Female-nml  Comment:     Technically difficult due to maternal habitus and fetal position. ---------------------------------------------------------------------- Cervix Uterus Adnexa  Cervix  Length:            4.3  cm.  Normal appearance by transabdominal scan  Uterus  No abnormality visualized.  Right Ovary  Not visualized.  Left Ovary  Not visualized.  Cul De Sac  No free fluid seen.  Adnexa  No adnexal mass visualized ---------------------------------------------------------------------- Impression  G1 P0. Patient is here for fetal anatomy scan. On cell-free  fetal DNA screening, the risks of aneuploidies are not  increased.  Pregravid BMI 40.  Early screening ruled out gestational  diabetes.  She reports no chronic medical conditions including  hypertension or diabetes.  We performed fetal anatomy scan. No makers of  aneuploidies or fetal structural defects are seen. Fetal  biometry is consistent with her previously-established dates.  Amniotic fluid is normal and good fetal activity is seen.  Patient understands the limitations of ultrasound in detecting  fetal anomalies.  As maternal obesity imposes limitations on the resolution of  images, fetal anomalies may be missed.  ---------------------------------------------------------------------- Recommendations  -An appointment was made for her to return in 5 weeks for  completion of fetal anatomy. ----------------------------------------------------------------------                  Noralee Space, MD Electronically Signed Final Report   08/23/2023 09:05 am ----------------------------------------------------------------------    MAU Management/MDM: Orders Placed This Encounter  Procedures   Wet prep, genital   Urinalysis, Routine w reflex microscopic -Urine, Clean Catch   Rupture of Membrane (ROM) Plus   Discharge patient    Meds ordered this encounter  Medications   simethicone (MYLICON) chewable tablet 80 mg   cyclobenzaprine (FLEXERIL) tablet 10 mg   polyethylene glycol (MIRALAX) 17 g packet    Sig: Take 17 g by mouth daily.    Dispense:  100 packet    Refill:  0   cyclobenzaprine (FLEXERIL) 10 MG tablet    Sig: Take 1 tablet (10 mg total) by mouth 3 (three) times daily as needed for muscle spasms.    Dispense:  30 tablet    Refill:  2    Patient presents for intermittent abdominal sharp pains and concern for PPROM. Reassuring dopplers. Possible UTI, vaginitis. Do not suspect PPROM based  upon exam, however will obtain UA, wet mount, rom plus, GC/C. Will trial simethicone, flexeril for discomfort.  UA, wet mount, Rom plus all negative. PPROM ruled-out. Patient did feel improved with simethicone and flexeril. Discussed how to treat/prevent constipation going forward as I feel the sharp pains are likely gas pains or MSK in origin. Discussed flexeril can be taken sparingly for muscle pain as well.  ASSESSMENT 1. Suspected rupture of membranes not found for normal first pregnancy   2. Abdominal pain affecting pregnancy   3. [redacted] weeks gestation of pregnancy     PLAN Discharge home with strict return precautions. Allergies as of 08/30/2023   No Known Allergies      Medication List     TAKE these  medications    aspirin EC 81 MG tablet Take 1 tablet (81 mg total) by mouth daily. Swallow whole.   cyclobenzaprine 10 MG tablet Commonly known as: FLEXERIL Take 1 tablet (10 mg total) by mouth 3 (three) times daily as needed for muscle spasms.   polyethylene glycol 17 g packet Commonly known as: MiraLax Take 17 g by mouth daily.   Prenatal Vitamin 27-0.8 MG Tabs Take 1 tablet by mouth daily.         Wylene Simmer, MD OB Fellow 08/30/2023  9:14 PM

## 2023-08-30 NOTE — MAU Note (Signed)
..  Anita [name] is a 27 y.o. at [redacted]w[redacted]d here in MAU reporting: intermittent sharp pains in mid abdomen since this morning, feels dull pain in between the sharp pain. Reports that the sharp pain comes and goes but has not been consistent enough for her to time them Had watery discharge since yesterday, felt a small gush around 1430 yesterday. today had a small amount on her underwear, does not have to wear a pad for the wetness.   Denies vaginal bleeding. +flutters   Pain score: 7/10 Vitals:   08/30/23 1926  BP: (!) 127/55  Pulse: 81  Resp: 16  Temp: 98.4 F (36.9 C)  SpO2: 100%     FHT:153 Lab orders placed from triage: UA

## 2023-08-31 LAB — GC/CHLAMYDIA PROBE AMP (~~LOC~~) NOT AT ARMC
Chlamydia: NEGATIVE
Comment: NEGATIVE
Comment: NORMAL
Neisseria Gonorrhea: NEGATIVE

## 2023-09-01 NOTE — Plan of Care (Signed)
[facility] Tonsillectomy/Adenoidectomy, Postoperative PEDS care plan entered in error.

## 2023-09-11 ENCOUNTER — Other Ambulatory Visit: Payer: Self-pay

## 2023-09-11 ENCOUNTER — Ambulatory Visit: Payer: Managed Care, Other (non HMO) | Admitting: Student

## 2023-09-11 VITALS — BP 139/76 | HR 78 | Temp 97.9°F | Wt 290.2 lb

## 2023-09-11 DIAGNOSIS — Z3A22 22 weeks gestation of pregnancy: Secondary | ICD-10-CM

## 2023-09-11 NOTE — Patient Instructions (Signed)
It was great to see you! Thank you for allowing me to participate in your care!  Our plans for today:  - Schedule apt with our OB faculty clinic in 4 weeks  Take care and seek immediate care sooner if you develop any concerns.   Dr. Erick Alley, DO [facility]   Prenatal Classes Go to [email or web address] for more information on the pregnancy and child birth classes that [place] has to offer.   Pregnancy Related Return Precautions The follow are signs/symptoms that are abnormal in pregnancy and may require further evaluation by a physician: Go to the MAU at [facility] if: You have cramping/contractions that do not go away with drinking water, especially if they are lasting 30 seconds to 1.5 minutes, coming and going every 5-10 minutes for an hour or more, or are getting stronger and you cannot walk or talk while having a contraction/cramp. Your water breaks.  Sometimes it is a big gush of fluid, sometimes it is just a trickle that keeps getting your underwear wet or running down your legs You have vaginal bleeding.    You do not feel your baby moving like normal.  If you do not, get something to eat and drink (something cold or something with sugar like peanut butter or juice) and lay down and focus on feeling your baby move. If your baby is still not moving like normal, you should go to MAU. You should feel your baby move 6 times in one hour, or 10 times in two hours. You have a persistent headache that does not go away with 1 g of Tylenol, vision changes, chest pain, difficulty breathing, severe pain in your right upper abdomen, worsening leg swelling- these can all be signs of high blood pressure in pregnancy and need to be evaluated by a provider immediately  These are all concerning in pregnancy and if you have any of these I recommend you call your PCP and present to the Maternity Admissions Unit (map below) for  further evaluation.  For any pregnancy-related emergencies, please go to the Maternity Admissions Unit in the [facility] at [facility]. You will use hospital Entrance C.    Our clinic number is [phone].   Dr Miquel Dunn

## 2023-09-11 NOTE — Progress Notes (Signed)
  [facility] Prenatal Visit  Anita [name] is a 27 y.o. G1P0000 at [redacted]w[redacted]d here for routine follow up. She is dated by LMP.  She reports  intermittent diarrhea and constipation .  She reports feeling flutters she thinks is baby moving. No bleeding, loss of fluid, contractions. See flow sheet for details.  Was seen at MAU on 08/30/2023 for abdominal pain which they suspected were d/t gas pains vs constipation. They prescribed miralax which she is not taking as BMs are inconsistent - sometimes constipation (having to strain), sometimes normal, and sometimes having diarrhea but only 1-2x/week.  Does have BM every day. Constipation mixed with diarrhea has been present prior to pregnancy. Abdominal pain has resolved.     Vitals:   09/11/23 1616 09/11/23 1703  BP: 130/66 139/76  Pulse: 77 78  Temp: 97.9 F (36.6 C)      A/P: Pregnancy at [redacted]w[redacted]d.  Doing well.   Dating reviewed, dating tab is correct Fetal heart tones Appropriate Fundal height within expected range.   Anatomy ultrasound completed on 08/23/2023 but unable to view all structures, repeat ultrasound scheduled for next month.  Influenza and COVID vaccine previously administered. .  Indications for screening for preexisting diabetes reviewed at last visit and 1 hr GTT negative   Pregnancy education provided on the following topics: fetal growth and movement, ultrasound assessment, and upcoming laboratory assessment.   The patient has the following indications for aspirinto begin 81 mg at 12-16 weeks: One high risk condition: no single high risk condition  MORE than one moderate risk condition: nulliparity, low SES  , and identifies as African American  Aspirin was  recommended today based upon above risk factors (one high risk condition or more than one moderate risk factor)  Patient advised to schedule next appointment with Faculty [facility] Clinic   Preterm labor precautions given.   2. Pregnancy issues include the  following and were addressed as appropriate today:   Constipation/diarrhea going on prior to pregnancy and is not severe. Could be IBS.  Reassuringly, abdomen is soft and nontender and she is well-appearing.  She also admits to having a lot of anxiety related to pregnancy.  She advised she can return to further discuss outside of her OB visits if she desires.  Problem list and pregnancy box updated: Yes.     Follow up 4 weeks in [facility] faculty clinic.

## 2023-09-12 ENCOUNTER — Encounter: Payer: Self-pay | Admitting: Student

## 2023-09-13 ENCOUNTER — Telehealth: Payer: Self-pay

## 2023-09-13 ENCOUNTER — Telehealth: Payer: Self-pay | Admitting: Student

## 2023-09-13 NOTE — Telephone Encounter (Signed)
Called and discussed with patient.   Will see her as scheduled tomorrow.

## 2023-09-13 NOTE — Telephone Encounter (Addendum)
Patient calls nurse line reporting a fall yesterday.   She reports she is ~[redacted] weeks pregnant and fell on her palms when she was chasing her dog yesterday. She reports her dog got loose which prompted her to run after him.   She reports her belly did not make contact with the ground, only her palms and knees.  She reports concerns as today she is having a lot of pain in her hip and pelvic region. She reports she feels like it is from running yesterday, however is unsure. She reports she only feels the pain when she is changing positions while lying down or walking.   She denies any vaginal bleeding, loss of fluid or abdominal cramping. She reports the baby has not "really moved much yet" at this point in her pregnancy.   Patient scheduled for tomorrow for evaluation.   Strict MAU precautions discussed with patient.   Will forward to [facility] provider.

## 2023-09-14 ENCOUNTER — Ambulatory Visit (INDEPENDENT_AMBULATORY_CARE_PROVIDER_SITE_OTHER): Payer: Managed Care, Other (non HMO) | Admitting: Student

## 2023-09-14 VITALS — HR 78 | Ht 69.0 in | Wt 292.0 lb

## 2023-09-14 DIAGNOSIS — Z6791 Unspecified blood type, Rh negative: Secondary | ICD-10-CM

## 2023-09-14 DIAGNOSIS — W19XXXS Unspecified fall, sequela: Secondary | ICD-10-CM

## 2023-09-14 DIAGNOSIS — O26899 Other specified pregnancy related conditions, unspecified trimester: Secondary | ICD-10-CM

## 2023-09-14 NOTE — Progress Notes (Signed)
    SUBJECTIVE:   CHIEF COMPLAINT / HPI:   Anita [name] is a pleasant 27 year old female, G1P0 at [redacted]w[redacted]d here after a fall in which she landed on her hands and knees when she was chasing her dog.  She denies any trauma to her abdomen.  She says she has had some mild dull aching since then, which is worse when she is walking or laying down. No cramping or contraction-like pain.  Denies any dizziness.  No vaginal bleeding.  No fluid leakage.  No syncopal symptoms. She has yet to be able to feel much fetal movement during this pregnancy, so she is unsure if baby has been moving less than usual.  On 08/30/23 she presented to the MAU for intermittent abdominal sharp pains concerning for PPROM, which was ruled out.  She was prescribed Flexeril  for muscle pain.  PERTINENT  PMH / PSH: Week D Rh status, pregnancy  OBJECTIVE:   Pulse 78   Ht 5' 9 (1.753 m)   Wt 292 lb (132.5 kg)   LMP 04/08/2023 (Exact Date)   SpO2 100%   BMI 43.12 kg/m   General: Pleasant, well-appearing, ambulates independently Cardiac: Regular rate and rhythm Respiratory: Normal work of breathing on room air Abdomen: Striae present, no bruising.  Soft and nontender.   ASSESSMENT/PLAN:   Fall Reassuringly, no abdominal trauma. Fetal heart tones in clinic normal today-150s bpm I did discuss with on-call OB/GYN, Dr. Ladene regarding further workup with possible NST, which is not indicated at this time. Strict return precautions discussed/MAU precautions discussed should she have any further concerns, especially contractions, vaginal bleeding, leakage of fluid.  WEAK D RH STATUS NEEDS RHOGAM Patient is noted to have weak D on antibody screen. No need for RhoGAM at today's visit given she did not have any vaginal bleeding. Will need RhoGAM at 28 weeks.     Tyronn Golda, DO [facility]

## 2023-09-14 NOTE — Patient Instructions (Addendum)
Your baby's heart tones were normal today!  Pregnancy Related Return Precautions The follow are signs/symptoms that are abnormal in pregnancy and may require further evaluation by a physician: Go to the MAU at [facility] at [facility] if: You have cramping/contractions that do not go away with drinking water, especially if they are lasting 30 seconds to 1.5 minutes, coming and going every 5-10 minutes for an hour or more, or are getting stronger and you cannot walk or talk while having a contraction/cramp. Your water breaks.  Sometimes it is a big gush of fluid, sometimes it is just a trickle that keeps getting your underwear wet or running down your legs You have vaginal bleeding.    You do not feel your baby moving like normal.  If you do not, get something to eat and drink (something cold or something with sugar like peanut butter or juice) and lay down and focus on feeling your baby move. If your baby is still not moving like normal, you should go to MAU. You should feel your baby move 6 times in one hour, or 10 times in two hours. You have a persistent headache that does not go away with 1 g of Tylenol, vision changes, chest pain, difficulty breathing, severe pain in your right upper abdomen, worsening leg swelling- these can all be signs of high blood pressure in pregnancy and need to be evaluated by a provider immediately   These are all concerning in pregnancy and if you have any of these I recommend you call your PCP and present to the Maternity Admissions Unit (map below) for further evaluation.   For any pregnancy-related emergencies, please go to the Maternity Admissions Unit in the [facility] at [facility]. You will use hospital Entrance C.      Our clinic number is [phone].

## 2023-09-14 NOTE — Telephone Encounter (Signed)
Error

## 2023-09-17 ENCOUNTER — Encounter: Payer: Self-pay | Admitting: Family Medicine

## 2023-09-17 DIAGNOSIS — Z6791 Unspecified blood type, Rh negative: Secondary | ICD-10-CM | POA: Insufficient documentation

## 2023-09-17 DIAGNOSIS — O26899 Other specified pregnancy related conditions, unspecified trimester: Secondary | ICD-10-CM | POA: Insufficient documentation

## 2023-09-17 DIAGNOSIS — W19XXXA Unspecified fall, initial encounter: Secondary | ICD-10-CM | POA: Insufficient documentation

## 2023-09-17 NOTE — Assessment & Plan Note (Signed)
Reassuringly, no abdominal trauma. Fetal heart tones in clinic normal today-150s bpm I did discuss with on-call OB/GYN, Dr. Foster Simpson regarding further workup with possible NST, which is not indicated at this time. Strict return precautions discussed/MAU precautions discussed should she have any further concerns, especially contractions, vaginal bleeding, leakage of fluid.

## 2023-09-17 NOTE — Assessment & Plan Note (Signed)
Patient is noted to have weak D on antibody screen. No need for RhoGAM at today's visit given she did not have any vaginal bleeding. Will need RhoGAM at 28 weeks.

## 2023-09-26 ENCOUNTER — Ambulatory Visit: Payer: Managed Care, Other (non HMO) | Attending: Obstetrics and Gynecology

## 2023-09-26 ENCOUNTER — Other Ambulatory Visit: Payer: Self-pay | Admitting: *Deleted

## 2023-09-26 ENCOUNTER — Other Ambulatory Visit: Payer: Self-pay

## 2023-09-26 DIAGNOSIS — Z3A32 32 weeks gestation of pregnancy: Secondary | ICD-10-CM

## 2023-09-26 DIAGNOSIS — E669 Obesity, unspecified: Secondary | ICD-10-CM | POA: Diagnosis not present

## 2023-09-26 DIAGNOSIS — O99212 Obesity complicating pregnancy, second trimester: Secondary | ICD-10-CM | POA: Diagnosis not present

## 2023-09-26 DIAGNOSIS — Z362 Encounter for other antenatal screening follow-up: Secondary | ICD-10-CM | POA: Diagnosis present

## 2023-09-27 ENCOUNTER — Ambulatory Visit: Payer: Managed Care, Other (non HMO)

## 2023-09-27 ENCOUNTER — Ambulatory Visit (INDEPENDENT_AMBULATORY_CARE_PROVIDER_SITE_OTHER): Payer: Managed Care, Other (non HMO) | Admitting: Family Medicine

## 2023-09-27 VITALS — BP 127/73 | HR 88 | Wt 294.0 lb

## 2023-09-27 DIAGNOSIS — Z3402 Encounter for supervision of normal first pregnancy, second trimester: Secondary | ICD-10-CM | POA: Diagnosis not present

## 2023-09-27 DIAGNOSIS — Z3A24 24 weeks gestation of pregnancy: Secondary | ICD-10-CM

## 2023-09-27 DIAGNOSIS — O26892 Other specified pregnancy related conditions, second trimester: Secondary | ICD-10-CM

## 2023-09-27 DIAGNOSIS — Z6791 Unspecified blood type, Rh negative: Secondary | ICD-10-CM

## 2023-09-27 NOTE — Progress Notes (Signed)
[facility] Visit  Anita [name] is a 27 y.o. G1P0000 at [redacted]w[redacted]d (via LMP = 19w u/s) who presents to [facility] for routine follow up. Prenatal course, history, notes, ultrasounds, and laboratory results reviewed.  Denies cramping/ctx, fluid leaking, vaginal bleeding, or decreased fetal movement. Taking PNV and aspirin.  Primary Prenatal Care Provider: Dr. Yetta Barre  Postpartum Plans: - delivery planning: SVD with epidural - circumcision: n/a, girl - feeding: breast - pediatrician: unsure, offered [facility] - contraception: undecided  BP: 127/73 FHR: 145bpm Uterine size: 30cm  Assessment & Plan  1. Routine prenatal care: - reviewed genetic/infection screening flowsheet questions with patient and FOB, no concerns identified - all future visits scheduled through 39w  2. Weak D - treating as essentially Rh negative, reviewed with patient will need antibody screen and Rhogam at 28w  3. Pregravid BMI 40 - likely contributing to s>d - getting growth scans via MFM, most recent EFW 72% - continue growth scans via MFM  Next prenatal visit in 4 weeks. Will need CBC, HIV, RPR, Antibody screen, 1hr GTT, and Rhogam at that visit. Labor & fetal movement precautions discussed.  Levert Feinstein, MD, [facility]

## 2023-10-09 ENCOUNTER — Encounter: Payer: Self-pay | Admitting: Student

## 2023-10-23 ENCOUNTER — Ambulatory Visit (INDEPENDENT_AMBULATORY_CARE_PROVIDER_SITE_OTHER): Payer: Managed Care, Other (non HMO) | Admitting: Obstetrics and Gynecology

## 2023-10-23 ENCOUNTER — Encounter: Payer: Self-pay | Admitting: Obstetrics and Gynecology

## 2023-10-23 VITALS — BP 119/78 | HR 83 | Wt 297.0 lb

## 2023-10-23 DIAGNOSIS — O99210 Obesity complicating pregnancy, unspecified trimester: Secondary | ICD-10-CM | POA: Insufficient documentation

## 2023-10-23 DIAGNOSIS — O360931 Maternal care for other rhesus isoimmunization, third trimester, fetus 1: Secondary | ICD-10-CM | POA: Diagnosis not present

## 2023-10-23 DIAGNOSIS — O99213 Obesity complicating pregnancy, third trimester: Secondary | ICD-10-CM | POA: Diagnosis not present

## 2023-10-23 DIAGNOSIS — Z1339 Encounter for screening examination for other mental health and behavioral disorders: Secondary | ICD-10-CM

## 2023-10-23 DIAGNOSIS — Z3400 Encounter for supervision of normal first pregnancy, unspecified trimester: Secondary | ICD-10-CM

## 2023-10-23 DIAGNOSIS — Z3A28 28 weeks gestation of pregnancy: Secondary | ICD-10-CM | POA: Diagnosis not present

## 2023-10-23 MED ORDER — RHO D IMMUNE GLOBULIN 1500 UNIT/2ML IJ SOSY
300.0000 ug | PREFILLED_SYRINGE | Freq: Once | INTRAMUSCULAR | Status: AC
Start: 2023-10-23 — End: 2023-10-23
  Administered 2023-10-23: 300 ug via INTRAMUSCULAR

## 2023-10-23 NOTE — Patient Instructions (Signed)
 [facility] Pediatric Providers  [place] ([address]) [facility] Manson Passey, MD; Deirdre Priest, MD; Lum Babe, MD; Leveda Anna, MD; McDiarmid, MD; Jerene Bears, MD [address] [place]., [place], [place] [address] [phone] Mon-Fri 8:30-12:30, 1:30-5:00  Providers come to see babies during newborn hospitalization Only accepting infants of Mother's who are seen at [facility] or have siblings seen at   [facility] Medicaid - Yes; Tricare - Yes   [facility] [place], MD [address] [place], [place] [address] [phone] Mon, Tue, Thur, Fri 8:30-5:00, Wed 10:00-7:00 (closed 1-2pm daily for lunch) [facility] residents with no insurance.  [place] [employer] only with Medicaid/insurance; Tricare - no  [facility]) - Tim [facility], MD; Manson Passey, MD; Ave Filter, MD; Luna Fuse, MD; Kennedy Bucker, MD; Florestine Avers, MD; Melchor Amour, MD; Yetta Barre,  MD; Konrad Dolores, MD; Kathlene November, MD; Jenne Campus, MD; Wynetta Emery, MD; Duffy Rhody, MD; Gerre Couch, NP [address] [place], [place], [place] [address] [phone] Mon, Tue, Thur, Fri 8:30-5:30, Wed 9:30-5:30, Sat 8:30-12:30 Only accepting infants of first-time parents or siblings of current patients Hospital discharge coordinator will make follow-up appointment Medicaid - yes; Tricare - yes  [place] [phone]) [facility] Ilean China, MD; Earlene Plater, MD; Jamesetta Orleans, MD; Alvera Novel, MD; Rana Snare, MD; [facility], MD; Shaaron Adler, MD; Hosie Poisson, MD; Mayford Knife, MD [address], [place], [place] [address] [phone] Mon-Fri 8:30-5:00, closed for lunch 12:30-1:30; Sat-Sun 10:00-1:00 Accepting Newborns with commercial insurance only, must call prior to delivery to be accepted into  practice.  Medicaid - no, Tricare - yes   [facility] [address] [place], [place] [address] [phone] or [phone] Mon to Fri 8am to 10pm, Sat 8am to 1pm  (virtual only on weekends) Only accepts Medicaid Healthy Blue pts  [facility] ([facility]) - Pediatrics at Elige Radon, MD; Sabino Dick, MD; Quitman Livings, MD; Betha Loa, NP; Claretha Cooper, MD; Lelon Perla, MD [address] [place], [place] [address] [phone] Mon-Fri 8:30-5:30 Medicaid - yes, Tricare - yes  [place] [phone]) [facility] Marcie Mowers, MD [address], [place], [place] [address] [phone] Iona Hansen, Wed Fri 8:30-5:00, Sat 8:30-12:00, Closed Thursdays Accepting siblings of established patients and first time mom's if you call prenatally Medicaid- yes; Tricare - yes  [facility] Lutricia Feil, [place]; Tracie Harrier, MD; Rusty Aus; Scifres, PA; Wynelle Link, MD; Azucena Cecil, MD;  [address] [place], [place] [address] [phone] Mon-Fri 8:30-5:00, closed for lunch 1-2 Only accepting newborns of established patients Medicaid- no; Tricare - yes  [facility] [phone]) [facility] Morene Crocker, MD; [address], [place], [place] [address] [phone] Mon-Fri 8:00-5:00 Medicaid - No; Tricare - Yes  [facility], [place]; [place], [place] [address] [place], [place] [address] [phone] Mon-Fri 8:00-5:00 Medicaid - No, Tricare - Yes  [facility] Cardell Peach, MD; Nash Dimmer, MD; [place], [place] [address] [place], [place] [address] [phone]  Mon-Fri 8:00-5:00 Medicaid - No; Tricare - Yes  [facility] [address] [place], [place] [address] [phone] Mon-Fri 8:30-5:00 (lunch 12:00-1:00) Medicaid -Yes; Tricare - Yes  [facility] [place], MD [address] [place], [place], [place] [address] [phone] Mon-Fri 8:00-5:00 Seeing newborns of current patients only. No new patients Medicaid - No, Tricare - yes  Nature conservation officer at [place] [address] MD [address] [place], [place] [address] [phone] Mon-Fri 8:00-5:00 Medicaid -yes as secondary coverage only;  Tricare - yes  [facility] [place], [place]; [place], [place]; Avis Epley, MD; Vonna Kotyk, MD; Clance Boll, MD; [place], [place]; Smoot, NP; Vaughan Basta, MD; [place], MD [address] [place], [place] [address] [phone] Mon-Fri 8:30-5:00, Sat 9:00-11:00 Accepts commercial insurance ONLY. Offers free prenatal information sessions for families. Medicaid - No, Tricare - Call first  [facility] [place], MD; [place], [place]; [place], [place]; [place], [place] [address] [place] [address] [phone] Mon-Fri 7:30-5:30 Medicaid - Yes; Ailene Rud yes  [place] [phone] & [phone])  [facility], MD [address], [place], [place] [address] [phone] Mon-Thur 8:00-6:00, closed for lunch 12-2, closed Fridays Medicaid - yes; Tricare - no  [facility] Dareen Piano, NP; Cyndia Bent, MD; [place], [place]; [place], [place] [address], [place], [place] [address] [phone] Mon-Fri 7:30-4:30 Medicaid - yes, Tricare - yes  [facility]  Juanito Doom, MD; Janene Harvey, NP; Vonita Moss, MD; Donn Pierini, NP [address], [place], [place] [address] [phone] Mon-Fri 8:30-5:00, closed for lunch 1-2, Sat 8:30-12:00 - sick visits only Providers come to see babies at [facility] Only accepting newborns of siblings and first time parents ONLY if who have met with office prior to delivery Medicaid -Yes; Tricare - yes  [facility] - [place], [place]; Spero Geralds, NP; Earlene Plater, MD; Lucretia Roers, MD:  [address], [place], [place] [address] [phone] Mon- Fri 8:00-5:00, Sat 9:00-12:00 - sick visits only Accepting siblings of established patients and first time mom/baby Medicaid - Yes; Tricare - yes Patients must have vaccinations (baby vaccines)  [place] [phone] &  [phone])  Adult nurse HealthCare at [facility] [address] [place], [place] [address] [phone] Mon-Fri 8:00-5:00 Medicaid - no; Tricare - yes  [facility] [place], MD; [place], [place]; [place], [place] [address], [place], [place] [address] [phone] Mon-Fri 8:00-5:00 Medicaid- yes; Tricare - yes  [facility] - Ardeen Jourdain, MD; Yetta Barre, NP; [place], [place] [address] [place], [place], [place] [address] [phone] Mon-Fri 8:00-5:00 Medicaid - Yes; Tricare - yes  [address] [place] [phone])  [facility] [place], [place]; [place], [place]; Eddie Candle, MD; Normand Sloop, MD; [place], NP; Isenhour, DO; [place], [place]; Constance Goltz, MD; Ruthann Cancer, MD; Vear Clock, MD; [place], [place]; [place], [place]; [place], [place] 7124 [facility] [address], [place], [place] [address] [phone] Mon-Fri 8:30-5:00, Sat 9:00-12:00 - sick only Please register online [email or web address] then schedule online or call office Medicaid-Yes; Tricare -yes  [facility] - Premier  [place], MD; Romualdo Bolk, MD; Skill[place], MD; [place], NP; [place], [place]; Antonietta Barcelona, MD; Mayford Knife, NP; Shelva Majestic, MD [address], [place], [place] [address] [phone] Mon-Fri 8:00-5:30, Sat&Sun by appointment (phones open at 8:30) Medicaid - Yes; Tricare - yes  [place] [phone] & [phone]) [facility] Mariel Aloe; [place], MD; Roger Shelter, MD; Arvilla Market, NP; [place], DO [address], [place], [place] [address] [phone] M-F 8:00 - 5:15, Sat/Sun 9-12 sick visits only Medicaid - No; Tricare - yes  [facility]  [place], PA-C; [place], PA-C; [place], DO; [place], PA-C; [place], PA-C; Roselyn Bering, MD [address], [place], [place] [address] [phone] Mon-Thur 8:00-7:00, Fri 8:00-5:00 Accepting Medicaid for 13 and under only   [facility] Family Medicine  at [place] [facility] [place]) [place], [place]; List, FNP; Berneda Rose, MD; [place], PA-C; Scholer,  MD; Kellie Simmering, FNP; Genevie Cheshire, FNP; Evaristo Bury, MD; Berneda Rose, MD [phone] [address], [place], [place] [address] [phone] Mon-Fri 8:30-5:30 Medicaid - Yes; Tricare - yes  [facility] - [address]  [place], [place]; Whitney Post, MD; Hennie Duos, MD; Wynne Dust, MD; Moriarty, NP [address], [place], [place] [address] [phone] Mon-Thur 8:00-5:30, Fri 8:00-5:00, Sat 9:00-12:00 Medicaid - yes, Tricare - yes  [place] [phone])  [facility], [place]; Lenise Arena, MD; [place], [place] [address], [place], [place] [address] [phone] Mon-Fri 8:00-5:00, closed for lunch 12-1 Medicaid - No; Tricare - yes  Nature conservation officer at [place] [facility], MD [address] [place], [place] [address] [phone] Mon-Fri 8:00-5:00 Medicaid - No; Tricare - yes  [facility], MD; Tami Ribas, MD; Mariam Dollar, MD; Yetta Barre, MD [address], [place], [place] [address] [phone] Mon-Fri 8:00-5:00 Medicaid- Yes; Tricare - yes  [place] [phone])  Adult nurse HealthCare at [facility], [place]; [place], MD [address] [place], [place], [place] [address] [phone] Mon-Fri 8:00-5:00 Medicaid - No; Tricare - yes  [facility] - Whitney Post - CPNP [address] [place], [place], [place] [address] [phone] Mon-Weds 8:00-6:00, Thurs-Fri 8:00-5:00, Sat 9:00-12:00 Medicaid - yes; Tricare - yes   [facility] Katharina Caper, MD; [place], [place] [address] [place], [place] [address] [phone] Mon-Fri 8:00-5:00 Medicaid - yes; Tricare - yes  [facility] Pediatric Providers  [facility] [address] [place], [place] [address] [phone] Sheral Flow: 8am -8pm, Tues, Weds: 8am - 5pm; Fri: 8-1 Medicaid - Yes; Tricare -  yes  [facility] Rachel Bo, MD; Laural Benes, MD; Anner Crete, MD; [place], [place]; [place], [place] [address], [place], [place] [address] [phone] M-F 8:30 - 5:00 Medicaid - Call office; Tricare -yes  [facility]on Snowball, MD; Shanon Rosser, MD, Chelsea Primus, MD; Shirlyn Goltz, PNP; Wardell Heath, NP [phone] [address], [place], [place] [address] [phone] M-F 8:30 - 5:00, Sat/Sun 8:30 - 12:30 (sick visits) Medicaid - Call office; Tricare -yes  [facility] Melvyn Neth, MD; Karl Luke, PNP; Princess Bruins, MD; [place], [place]; [place], NP; Cynda Familia [address], [place], [place] [address] [phone] M-F 8:30 - 5:00 Medicaid - Call office; Tricare - yes  [facility] Jesusita Oka, MD; Dierdre Highman, MD; Earnest Conroy, MD; Timothy Lasso, MD; N[place], MD [phone] S[address] [place], [place] [address] [phone] M-Thur: 8:00 - 5:00; Fri: 8:00 - 4:00 Medicaid - yes; Tricare - yes  [facility] [address] [place], [place] [address] [phone] M-F: 8:30- 5:00, closed for lunch 12:30 - 1:00 Medicaid - yes; Tricare -yes  [facility] [address] [place], [place] [address] [phone] M-F 8:00 - 5:00 Medicaid - yes; Tricare - yes  [facility] [place], DO; [place], DO; [place], NP [address], [place], [place] [address] [phone] M-F 8:00 - 5:00, Closed 12-1 for lunch Medicaid - Call; Tricare - yes  [facility] Meredith Mody, MD [address], [place], [place] [address] [phone] M-F: 8:00-5:00, Sat: 8:00 - noon Medicaid - call; Tricare -yes  [facility] Pediatric Providers  [facility] [place], [place] [address] [place], [place], [place] [address] [phone] M-W: 8:00-5:00, Thur: 8:00 - 7:00, Fri: 8:00 - noon Medicaid - yes; Tricare - yes  [facility] - Quay Burow, FNP [address], [place],  [place] [address] [phone] M-F 8:00 - 5:00, Closed for lunch 12-1 Medicaid -  yes; Tricare - yes  [facility] Pediatric Providers  [facility] at [place], [place], Alinda Money, MD, [place], FNP-C [address], [facility], [address], [place], [place] [address] [phone] M-T 8:00-5:00, Wed-Fri 7:00-6:00 Medicaid - Yes; Tricare -yes  [facility] at [facility], [place]; [address] [place] Saner [place], [place] [address] [phone] M-F 8:00 - 5:00, closed for lunch 12-1 Medicaid - Yes; Tricare - yes  [facility] and Internal Medicine  Zachery Dauer, MD; Gladstone Lighter, MD; Collie Siad, MD; Freda Jackson, MD; Rich Number, MD; Darryl Nestle, MD; Melinda Crutch, MD, Audria Nine, MD; Tawanna Cooler, MD; Steffanie Dunn, MD; Byrd Hesselbach, MD; Lucretia Roers, MD [address] [place], [place] [address] [phone] M-F 8:00-5:00 Medicaid - yes; Tricare - yes  [facility] [place], MD (speaks [place] and Hindi) [address] [place], [place] [address] [phone] M-F: 8:30 - 5:00, closed 12:30 - 1 for lunch Medicaid - Yes; Tricare -yes  [facility] Providers  [facility] Shanda Bumps, MD; Chanetta Marshall, MD; Laurell Josephs, MD [address] [place], [place] [address] [phone] M-Th: 8:00 - 5:30, Fri: 8:00 - 12:00 Medicaid - yes; Tricare - yes  [facility] at [facility], NP; Thora Lance, MD; Orrin Brigham, MD 207 249 [address] [place], [place] [address] [phone] M-F: 8:00 - 5:00 Medicaid - yes; Tricare - yes  [facility] [place], NP; Orson Slick, NP; Salley Scarlet, NP; Linton Flemings, MD; Mayford Knife, MD, [place], NP, Emelda Fear, MD; Nida Boatman [address], [place], [place] [address] [phone] M-F: 8:30 - 5:30p Medicaid - yes; Tricare - yes Other locations available as well  [facility], MD; Andrey Campanile, MD; Neville Route, PA-C [address], [place], [place] [address] [phone] M-W: 8:00am - 7:00pm, Thurs: 8:00am - 8:00pm; Fri: 8:00am -  5:00pm, closed daily from 12-1 for lunch Medicaid - yes; Tricare - yes  [facility] Pediatric Providers  [facility] Pediatrics at Levin Erp, MD; Aggie Cosier, FNP; Bland Span, MD; Tristan Schroeder, MD; [place], PNP; Alesia Banda; [place], [place]; Julian Reil, MD;  [address] [place], [place] [address] [phone] Judie Petit - Caleen Essex: 8am - 5pm, Sat 9-noon Medicaid - Yes; Tricare -yes  [facility] at Jaclynn Guarneri, MD; Yetta Barre, FNP; Lilian Kapur, MD; Mariam Dollar, MD [address] [place], [place] [phone] M-F 8:00 - 5:00 Medicaid - call; Tricare - yes  [facility]- Cruz Condon, MD; [place], [place]; Delora Fuel, MD; Dareen Piano, MD; Trudee Grip, MD; Kizzie Ide, MD; Zebedee Iba; Birdena Crandall, MD; Hinton Dyer, MD; Parker, MD [address] [place], [place] [address] [phone] M-F 8:00am - 5:00pm; Sat. 9:00 - 11:00 Medicaid - yes; Tricare - yes  [facility] at [facility], MD [address], [place], [place] [address] [phone] M-F 8:00 - 5:00 Medicaid - [place] Medicaid only; Tricare - yes  [facility] - Illene Bolus, MD; Earlene Plater, [place]; Kenyon Ana, MD [address], [place], [place] [address] [phone] M-F 8:00 - 5:00 Medicaid - yes; Tricare - yes  [facility] [address] Pediatrics - Lind Covert, MD; Manson Passey, MD, [facility], MD, [place], MD; Makemie Park, MD; Katrinka Blazing, MD; [address] [place], [place] [address] [phone] M-F: 8-5 Medicaid - yes; Tricare - yes  [facility] - Henrietta Hoover, [place]; [place], MD; [address], [place], [place] [address] [phone] M-F 8-5 Medicaid - yes; Tricare - yes  [address] [place] Darrol Poke, MD; Tami Ribas, MD;  [place], MD; [place], DNP; [place], PNP [address], [place], [place] [address] [phone] M-F 8-5 Medicaid - yes; Tricare - yes  [facility] Delories Heinz, MD;  Adrienne Mocha; Ala Bent, MD [address] [place], [place] [address] [phone] M-F 7am - 5 pm Medicaid - call; Tricare - yes  [facility] [place], [place]; Fredia Beets, MD; Roxan Hockey, MD [address] [place], [place] [address] [phone] M-F 8-5 Medicaid - yes; Tricare - yes  [facility] Kae Heller, MD; Sheliah Hatch, MD; Mayford Knife, FNP; Shon Baton, FNP; Tyron Russell, FNP; Ishmael Holter; [facility] - FNP [address] [place], [place] [address] [phone] M-F 8-5 Medicaid- yes; Tricare - yes  [facility] - Betsy Coder, [place] and Chalmers Guest, MD; Terrial Rhodes, MD; Hulda Humphrey, MD; Roseanne Reno, MD; [place], [place]; Ala Dach, MD; Fredia Beets, MD; Dimple Casey, MD [address], [place], [place] [address] [phone] M-F: 8-5, Sat: 9-4, Sun 9-12 Medicaid - yes; Tricare - yes  [facility] [place], PNP; Earlene Plater, PNP [address] [place], [place] [address] [phone] M-F 8 - 5, closed 12-1 for lunch Medicaid - yes; Tricare - yes  [facility]s Kathyrn Lass, MD; Hal Neer, MD; Dimple Casey, MD; Pence, DO [address] [place], [place] [address] [phone] M-F 8- 5:30 Medicaid - yes; Tricare - yes  [facility] Pediatrics - Biagio Quint, MD; Rosalia Hammers, MD; Gwenith Daily, MD [address], [place], [place] [address] [phone] Judie Petit: Nicholas Lose; Tues-Fri: 8-5; Sat: 9-12 Medicaid - yes; Tricare - yes  [facility] Pediatrics - Bobbye Morton, MD; Daphane Shepherd, MD; Chestine Spore, MD; Haskell Riling, MD; Kate Sable, MD [address], [place], [place] [address] [phone] Judie PetitMarland Kitchen Nicholas LoseFrancee Nodal: 8-5; Sat: 8:30-12:30 Medicaid - yes; Tricare - yes  Olena Heckle [facility]s - Beckey Rutter, MD; [place], [place] [address], [place], [place] [address] [phone] Mon-Fri: 8-5 Medicaid - yes;  Tricare - yes  [facility] - [place], CPNP; [place], [place]; Dimple Casey, MD; Alisa Graff, MD; Cephus Shelling, MD; [address], [place], [place] [address] [phone] M-F: 8-5, closed 1-2 for lunch Medicaid - yes; Tricare - yes  [facility] Pediatrics - [facility] [place], [place]; [place], [place]; Katrinka Blazing, MD; [place], CPNP; [place], [place]; [place], MD; Earlene Plater, MD [address], [place], [place] [address] [phone] M-Thurs: Nicholas Lose; Fri: 8-6; Sat: 9-12; Sun 2-4 Medicaid - yes; Tricare - yes  [facility] Georgeanna Lea, MD; Evette Cristal, MD; Shea Stakes, FNP; Earney Mallet, DO; [address] [place], [place] [address] [phone] M-F: 8-5 Medicaid - yes; Tricare - yes  [facility] Pediatric Providers  [facility] Family Medicine -Collene Mares, MD; [place], NP [address], [place], [place] [address] [phone] M - Fri: 8am - 5pm, closed for lunch 12-1 Medicaid - Yes; Tricare - yes  [facility] Elinor Parkinson, MD; Victory Dakin, MD; [place], DO; Vinocur, MD;Hall, PA; Clent Ridges, [place]; Orvan Falconer, NP [phone] [address] [place], [place] [address] [phone] M-F 8:00 - 5:00, Sat 8:00 - 11:30 Medicaid - yes; Tricare - yes  [facility] Welton Flakes, MD; [place], MD, [address], MD, [place], MD, [place], MD; [place], NP; [place], [place];  [address], [place], [place] [address] [phone] M-F 8:10am - 5:00pm Medicaid - yes; Tricare - yes  [facility], MD; [place], NP [address] [place], [place] [address] [phone] M-F 8:00 - 5:00 Medicaid - [place] Medicaid only; Tricare - yes  [facility] - Deep [address] [place], [place]; [place], NP [address], [place], [place] [address] [phone] M-F 8:00 - 5:00; Closed for lunch 12 - 1:00 Medicaid - yes;  Tricare - yes  [facility] Belva Crome, MD; Jonita Albee, FNP [address] [place], [place] [address] [phone] Mon 9-5; Tues/Wed 10-5; Thurs 8:30-5; Fri: 8-12:30 Medicaid - yes; Tricare - yes  [facility] Pediatric Providers  [facility]  [place], MD; [place], [place] [address], [place], [place] [address] [phone] phone [phone] fax M-F 7:15 - 4:30 Medicaid - yes; Tricare - yes  [facility] Karilyn Cota, MD; [place] [address], [place], [place] [address] [phone] M-Fri: 8:30 - 5:00, closed for lunch everyday noon - 1pm Medicaid - Yes; Tricare - yes  [facility] Burdine, MD; Reuel Boom, MD; Dimas Aguas, MD; Neita Carp, MD; [place], [place]; Bonnita Nasuti, [place]; [place], [place]; [place], [place]; [place], [place] [address] [place], [place] [address] [phone] M-Thurs: 7:30am - 7:00pm; Friday 7:30am - 4pm; Sat: 8:00 - 1:00 Medicaid - Yes; Tricare - yes  [place] Norval Morton, MD; Conni Elliot, MD; Carroll Kinds, MD; Charco, DO [address], [place], [place] [address] [phone] M-Thur: 8:00 - 5:00, Fri: 8:00 - Noon Medicaid - yes; Tricare - yes No [place] [employer]  [place] [facility] Dettinger, MD; Nadine Counts, DO; [place], NP; Daphine Deutscher, NP; Lequita Halt, NP; Ellamae Sia, NP; Reginia Forts, NP; Darlyn Read, MD; [place], [place] [phone] [address] [place], [place] [address] [phone] M-F 8:00 - 5:00 Medicaid - yes; Tricare - yes  [facility], FNP-C; [place], FNP-C [address] [place], [place] [address] [phone] M, W, R 8:00-5:00, Tues: 8:00am - 7:00pm; Fri 8:00 - noon Medicaid - Yes; Tricare - yes  [facility], MD [address] [place], [place] [address] [phone]  M-Thurs 8:30-5:30, Fri: 8:30-12:30pm Medicaid - Yes; Tricare - N Birth Control Options Birth control is also called contraception. Birth control  prevents pregnancy. There are many types of birth control. Work with your health care provider to find the best option for you. Birth control that uses hormones These types of birth control have hormones in them to prevent pregnancy. Birth control implant This is a small tube that is put into the skin of your arm. The tube can stay in for up to 3 years. Birth control shot These are shots you get every 3 months. Birth control pills This is a pill you take every day. You need to take it at the same time each day. Birth control patch This is a patch that you put on your skin. You change it 1 time each week for 3 weeks. After that, you take the patch off for 1 week. Vaginal ring  This is a soft plastic ring that you put in your vagina. The ring is left in for 3 weeks. Then, you take it out for 1 week. Then, you put a new ring in. Barrier methods  Female condom This is a thin covering that you put on the penis before sex. The condom is thrown away after sex. Female condom This is a soft, loose covering that you put in the vagina before sex. The condom  is thrown away after sex. Diaphragm A diaphragm is a soft barrier that is shaped like a bowl. It must be made to fit your body. You put it in the vagina before sex with a chemical that kills sperm called spermicide. A diaphragm should be left in the vagina for 6-8 hours after sex and taken out within 24 hours. You need to replace a diaphragm: Every 1-2 years. After giving birth. After gaining more than 15 lb (6.8 kg). If you have surgery on your pelvis. Cervical cap This is a small, soft cup that fits over the cervix. The cervix is the lowest part of the uterus. It's put in the vagina before sex, along with spermicide. The cap must be made for you. The cap should be left in for 6-8 hours after sex. It is taken out within 48 hours. A cervical cap must be prescribed and fit to your body by a provider. It should be replaced every 2  years. Sponge This is a small sponge that is put into the vagina before sex. It must be left in for at least 6 hours after sex. It must be taken out within 30 hours and thrown away. Spermicides These are chemicals that kill or stop sperm from getting into the uterus. They may be a pill, cream, jelly, or foam that you put into your vagina. They should be used at least 10-15 minutes before sex. Intrauterine device An intrauterine device (IUD) is a device that's put in the uterus by a provider. There are two types: Hormone IUD. This kind can stay in for 3-5 years. Copper IUD. This kind can stay in for 10 years. Permanent birth control Female tubal ligation This is surgery to block the fallopian tubes. Hysteroscopic sterilization This is a procedure to put an insert into each fallopian tube. This method takes 3 months to work. Other forms of birth control must be used for 3 months. Female sterilization This is a surgery, called a vasectomy, to tie off the tubes that carry sperm in men. This method takes 3 months to work. Other forms of birth control must be used for 3 months. Natural planning methods This means not having sex on the days the female partner could get pregnant. Here are some types of natural planning birth control: Using a calendar: To keep track of the length of each menstrual cycle. To find out what days pregnancy can happen. To plan to not have sex on days when pregnancy can happen. Watching for signs of ovulation and not having sex during this time. The female partner can check for ovulation by keeping track of their temperature each day. They can also look for changes in the mucus that comes from the cervix. Where to find more information Centers for Disease Control and Prevention: TonerPromos.no This information is not intended to replace advice given to you by your health care provider. Make sure you discuss any questions you have with your health care provider. Document Revised:  12/26/2022 Document Reviewed: 12/26/2022 Elsevier Patient Education  2024 [employer].

## 2023-10-23 NOTE — Progress Notes (Signed)
   PRENATAL VISIT NOTE  Subjective:  Anita [name] is a 27 y.o. G1P0000 at [redacted]w[redacted]d being seen today for ongoing prenatal care. Patient is transferring care from [facility].  She is currently monitored for the following issues for this high-risk pregnancy and has Obesity (BMI 30-39.9); Supervision of normal first pregnancy, antepartum; WEAK D RH STATUS NEEDS RHOGAM; Fall; and Maternal obesity, antepartum on their problem list.  Patient reports no complaints.  Contractions: Irritability. Vag. Bleeding: None.  Movement: Present. Denies leaking of fluid.   The following portions of the patient's history were reviewed and updated as appropriate: allergies, current medications, past family history, past medical history, past social history, past surgical history and problem list.   Objective:   Vitals:   10/23/23 1614  BP: 119/78  Pulse: 83  Weight: 297 lb (134.7 kg)    Fetal Status: Fetal Heart Rate (bpm): 140   Movement: Present     General:  Alert, oriented and cooperative. Patient is in no acute distress.  Skin: Skin is warm and dry. No rash noted.   Cardiovascular: Normal heart rate noted  Respiratory: Normal respiratory effort, no problems with respiration noted  Abdomen: Soft, gravid, appropriate for gestational age.  Pain/Pressure: Present     Pelvic: Cervical exam deferred        Extremities: Normal range of motion.  Edema: None  Mental Status: Normal mood and affect. Normal behavior. Normal judgment and thought content.   Assessment and Plan:  Pregnancy: G1P0000 at [redacted]w[redacted]d 1. Supervision of normal first pregnancy, antepartum Patient is doing well  Labs and ultrasound reviewed Rhogam today Patient undecided on contraception and pediatrician. Information provided for both  2. Maternal obesity, antepartum Continue ASA Follow up growth ultrasound on 11/09/23  Preterm labor symptoms and general obstetric precautions including but not limited to vaginal bleeding, contractions,  leaking of fluid and fetal movement were reviewed in detail with the patient. Please refer to After Visit Summary for other counseling recommendations.   Return in about 2 weeks (around 11/06/2023) for in person, ROB, High risk.  Future Appointments  Date Time Provider Department Center  10/26/2023  4:10 PM Joya Salm [facility]  11/09/2023  8:15 AM [facility] NURSE [facility]  11/09/2023  8:30 AM [facility] US3 [facility]  11/09/2023 10:10 AM Alfredo Martinez, MD [facility]  11/22/2023  4:10 PM Darral Dash, DO [facility]  12/06/2023  4:10 PM Alicia Amel, MD [facility]  12/20/2023  4:10 PM Erick Alley, DO [facility]  12/27/2023  4:10 PM Erick Alley, DO [facility]  01/03/2024  8:40 AM [facility] OB CLINIC [facility]  01/10/2024  4:10 PM Alicia Amel, MD [facility]    Catalina Antigua, MD

## 2023-10-23 NOTE — Progress Notes (Signed)
Transfer OB   CC: HA's no visual changes, no swelling will get relief with tylenol 500mg  if not will take another.   Wants to discuss Rhogam.

## 2023-10-24 ENCOUNTER — Telehealth: Payer: Self-pay | Admitting: Family Medicine

## 2023-10-24 NOTE — Telephone Encounter (Signed)
Patient called to inform us that she has found an OB office closer to her and will continue her Ob Care with that office.   I have cancelled all future OB appointments with [facility].

## 2023-10-26 ENCOUNTER — Encounter: Payer: Self-pay | Admitting: Student

## 2023-10-31 ENCOUNTER — Encounter: Payer: Self-pay | Admitting: Obstetrics and Gynecology

## 2023-11-05 ENCOUNTER — Encounter (HOSPITAL_COMMUNITY): Payer: Self-pay | Admitting: Obstetrics and Gynecology

## 2023-11-05 ENCOUNTER — Inpatient Hospital Stay (HOSPITAL_COMMUNITY)
Admission: AD | Admit: 2023-11-05 | Discharge: 2023-11-05 | Disposition: A | Discharge status: Home / Self Care | Payer: Managed Care, Other (non HMO) | Attending: Obstetrics and Gynecology | Admitting: Obstetrics and Gynecology

## 2023-11-05 DIAGNOSIS — O99210 Obesity complicating pregnancy, unspecified trimester: Secondary | ICD-10-CM

## 2023-11-05 DIAGNOSIS — O99213 Obesity complicating pregnancy, third trimester: Secondary | ICD-10-CM | POA: Insufficient documentation

## 2023-11-05 DIAGNOSIS — O219 Vomiting of pregnancy, unspecified: Secondary | ICD-10-CM | POA: Diagnosis not present

## 2023-11-05 DIAGNOSIS — O26893 Other specified pregnancy related conditions, third trimester: Secondary | ICD-10-CM | POA: Insufficient documentation

## 2023-11-05 DIAGNOSIS — Z3A30 30 weeks gestation of pregnancy: Secondary | ICD-10-CM | POA: Insufficient documentation

## 2023-11-05 DIAGNOSIS — Z3689 Encounter for other specified antenatal screening: Secondary | ICD-10-CM | POA: Insufficient documentation

## 2023-11-05 DIAGNOSIS — R519 Headache, unspecified: Secondary | ICD-10-CM | POA: Diagnosis present

## 2023-11-05 DIAGNOSIS — Z3400 Encounter for supervision of normal first pregnancy, unspecified trimester: Secondary | ICD-10-CM

## 2023-11-05 DIAGNOSIS — R030 Elevated blood-pressure reading, without diagnosis of hypertension: Secondary | ICD-10-CM | POA: Diagnosis present

## 2023-11-05 DIAGNOSIS — R112 Nausea with vomiting, unspecified: Secondary | ICD-10-CM | POA: Diagnosis present

## 2023-11-05 DIAGNOSIS — O212 Late vomiting of pregnancy: Secondary | ICD-10-CM | POA: Diagnosis not present

## 2023-11-05 LAB — RESPIRATORY PANEL BY PCR

## 2023-11-05 LAB — CBC
HCT: 34.6 % — ABNORMAL LOW (ref 36.0–46.0)
Hemoglobin: 11.7 g/dL — ABNORMAL LOW (ref 12.0–15.0)
MCH: 29.4 pg (ref 26.0–34.0)
MCHC: 33.8 g/dL (ref 30.0–36.0)
MCV: 86.9 fL (ref 80.0–100.0)
Platelets: 276 10*3/uL (ref 150–400)
RBC: 3.98 MIL/uL (ref 3.87–5.11)
RDW: 13.1 % (ref 11.5–15.5)
WBC: 12.8 10*3/uL — ABNORMAL HIGH (ref 4.0–10.5)
nRBC: 0 % (ref 0.0–0.2)

## 2023-11-05 LAB — PROTEIN / CREATININE RATIO, URINE
Creatinine, Urine: 81 mg/dL
Protein Creatinine Ratio: 0.1 mg/mg{creat} (ref 0.00–0.15)
Total Protein, Urine: 8 mg/dL

## 2023-11-05 LAB — COMPREHENSIVE METABOLIC PANEL
ALT: 31 U/L (ref 0–44)
AST: 19 U/L (ref 15–41)
Albumin: 2.6 g/dL — ABNORMAL LOW (ref 3.5–5.0)
Alkaline Phosphatase: 62 U/L (ref 38–126)
Anion gap: 4 — ABNORMAL LOW (ref 5–15)
BUN: 9 mg/dL (ref 6–20)
CO2: 23 mmol/L (ref 22–32)
Calcium: 8.4 mg/dL — ABNORMAL LOW (ref 8.9–10.3)
Chloride: 107 mmol/L (ref 98–111)
Creatinine, Ser: 0.69 mg/dL (ref 0.44–1.00)
GFR, Estimated: >60 mL/min (ref >60)
Glucose, Bld: 93 mg/dL (ref 70–99)
Potassium: 3.6 mmol/L (ref 3.5–5.1)
Sodium: 134 mmol/L — ABNORMAL LOW (ref 135–145)
Total Bilirubin: <0.2 mg/dL (ref <1.2)
Total Protein: 5.5 g/dL — ABNORMAL LOW (ref 6.5–8.1)

## 2023-11-05 LAB — URINALYSIS, ROUTINE W REFLEX MICROSCOPIC
Bilirubin Urine: NEGATIVE
Glucose, UA: NEGATIVE mg/dL
Ketones, ur: NEGATIVE mg/dL
Leukocytes,Ua: NEGATIVE
Nitrite: NEGATIVE
Protein, ur: NEGATIVE mg/dL
Specific Gravity, Urine: 1.013 (ref 1.005–1.030)
pH: 6 (ref 5.0–8.0)

## 2023-11-05 LAB — RESP PANEL BY RT-PCR (RSV, FLU A&B, COVID)  RVPGX2
Influenza A by PCR: NEGATIVE
Influenza B by PCR: NEGATIVE
Resp Syncytial Virus by PCR: NEGATIVE
SARS Coronavirus 2 by RT PCR: NEGATIVE

## 2023-11-05 MED ORDER — ONDANSETRON 4 MG PO TBDP
8.0000 mg | ORAL_TABLET | Freq: Once | ORAL | Status: AC
  Administered 2023-11-05: 8 mg via ORAL
  Filled 2023-11-05: qty 2

## 2023-11-05 MED ORDER — LACTATED RINGERS IV BOLUS: 1000.0000 mL | Freq: Once | INTRAVENOUS | Status: DC

## 2023-11-05 MED ORDER — ONDANSETRON HCL 4 MG/2ML IJ SOLN: 4.0000 mg | Freq: Once | INTRAMUSCULAR | Status: DC

## 2023-11-05 NOTE — MAU Note (Signed)
.  Anita [name] is a 27 y.o. at [redacted]w[redacted]d here in MAU reporting: started having a headache and felt nauseated and vomited. Took her b/p and it was elevated. Called on call nurse and instructed to take b/p again and it was 142/85. Still has slight headache nausea has gone away. Denies any visual changes or abd. Good fetal movement felt. Has not taken anything for headache. Has had elevated b/p but not this high  LMP:  Onset of complaint: this evening Pain score: 2 Vitals:   11/05/23 0150  BP: 129/73  Pulse: 81  Resp: 18  Temp: 97.9 F (36.6 C)     FHT:145 Lab orders placed from triage: u/a

## 2023-11-05 NOTE — MAU Provider Note (Signed)
Chief Complaint:  Nausea and Headache  HPI  Anita [name] is a 27 y.o. G1P0000 at [redacted]w[redacted]d who presents to maternity admissions reporting an episode of nausea, vomiting and a slight headache. Reports waking up from her sleep with nausea, then she threw up and noted a headache so she checked her BP which was borderline elevated. Called the on-call nurse who told her to repeat her BP (elevated again, higher this time but not severe range). Told to come to MAU. Only mildly nauseated now, headache has abated. 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 No other physical complaints.  Pregnancy Course: Receives prenatal care at [facility], prenatal records reviewed.  Past Medical History:  Diagnosis Date   Chlamydia infection    Depression 01/17/2018   Lipoma 05/03/2021   Migraine without aura 01/31/2007   Qualifier: Diagnosis of   By: Deirdre Priest MD, Gaynell Face         Migraines    OB History  Gravida Para Term Preterm AB Living  1 0 0 0 0 0  SAB IAB Ectopic Multiple Live Births  0 0 0 0 0    # Outcome Date GA Lbr Len/2nd Weight Sex Type Anes PTL Lv  1 Current            Past Surgical History:  Procedure Laterality Date   APPENDECTOMY     BARTHOLIN CYST MARSUPIALIZATION N/A 12/31/2019   Procedure: BARTHOLIN CYST MARSUPIALIZATION;  Surgeon: Theresia Majors, MD;  Location: [facility];  Service: Gynecology;  Laterality: N/A;   Nexplanon      Inserted 09-14-17   Family History  Problem Relation Age of Onset   Asthma Mother    Healthy Father    Epilepsy Sister    Migraines Maternal Grandfather    Social History   Tobacco Use   Smoking status: Never    Passive exposure: Never   Smokeless tobacco: Never  Vaping Use   Vaping status: Never Used  Substance Use Topics   Alcohol use: Not Currently    Comment: occasionally   Drug use: No   No Known Allergies No medications prior to admission.   I have reviewed patient's Past Medical Hx, Surgical Hx, Family  Hx, Social Hx, medications and allergies.   ROS  Pertinent items noted in HPI and remainder of comprehensive ROS otherwise negative.   PHYSICAL EXAM  Patient Vitals for the past 24 hrs:  BP Temp Pulse Resp Height Weight  11/05/23 0601 118/62 -- 71 -- -- --  11/05/23 0531 (!) 116/49 -- 80 -- -- --  11/05/23 0501 120/67 -- 73 -- -- --  11/05/23 0431 114/65 -- 81 -- -- --  11/05/23 0401 120/68 -- 82 -- -- --  11/05/23 0331 125/70 -- 90 -- -- --  11/05/23 0307 131/70 -- 84 -- -- --  11/05/23 0231 124/63 -- 84 -- -- --  11/05/23 0213 132/64 -- 83 -- -- --  11/05/23 0150 129/73 97.9 F (36.6 C) 81 18 5\' 9"  (1.753 m) 300 lb (136.1 kg)   Constitutional: Well-developed, well-nourished female in no acute distress.  Cardiovascular: normal rate & rhythm, warm and well-perfused Respiratory: normal effort, no problems with respiration noted GI: Abd soft, non-tender, non-distended MS: Extremities nontender, no edema, normal ROM Neurologic: Alert and oriented x 4.  GU: no CVA tenderness Pelvic: exam deferred  Fetal Tracing: reactive Baseline: 135 Variability: moderate Accelerations: 15x15 Decelerations: none Toco: relaxed   Labs: Results for orders placed or performed  during the hospital encounter of 11/05/23 (from the past 24 hours)  Urinalysis, Routine w reflex microscopic -Urine, Clean Catch     Status: Abnormal   Collection Time: 11/05/23  2:03 AM  Result Value Ref Range   Color, Urine YELLOW YELLOW   APPearance HAZY (A) CLEAR   Specific Gravity, Urine 1.013 1.005 - 1.030   pH 6.0 5.0 - 8.0   Glucose, UA NEGATIVE NEGATIVE mg/dL   Hgb urine dipstick MODERATE (A) NEGATIVE   Bilirubin Urine NEGATIVE NEGATIVE   Ketones, ur NEGATIVE NEGATIVE mg/dL   Protein, ur NEGATIVE NEGATIVE mg/dL   Nitrite NEGATIVE NEGATIVE   Leukocytes,Ua NEGATIVE NEGATIVE   RBC / HPF 0-5 0 - 5 RBC/hpf   WBC, UA 0-5 0 - 5 WBC/hpf   Bacteria, UA RARE (A) NONE SEEN   Squamous Epithelial / HPF 0-5 0 - 5 /HPF    Mucus PRESENT   Protein / creatinine ratio, urine     Status: None   Collection Time: 11/05/23  2:03 AM  Result Value Ref Range   Creatinine, Urine 81 mg/dL   Total Protein, Urine 8 mg/dL   Protein Creatinine Ratio 0.10 0.00 - 0.15 mg/mg[Cre]  Respiratory (~20 pathogens) panel by PCR     Status: None   Collection Time: 11/05/23  3:40 AM   Specimen: Nasopharyngeal Swab; Respiratory  Result Value Ref Range   Adenovirus NOT DETECTED NOT DETECTED   Coronavirus 229E NOT DETECTED NOT DETECTED   Coronavirus HKU1 NOT DETECTED NOT DETECTED   Coronavirus NL63 NOT DETECTED NOT DETECTED   Coronavirus OC43 NOT DETECTED NOT DETECTED   Metapneumovirus NOT DETECTED NOT DETECTED   Rhinovirus / Enterovirus NOT DETECTED NOT DETECTED   Influenza A NOT DETECTED NOT DETECTED   Influenza B NOT DETECTED NOT DETECTED   Parainfluenza Virus 1 NOT DETECTED NOT DETECTED   Parainfluenza Virus 2 NOT DETECTED NOT DETECTED   Parainfluenza Virus 3 NOT DETECTED NOT DETECTED   Parainfluenza Virus 4 NOT DETECTED NOT DETECTED   Respiratory Syncytial Virus NOT DETECTED NOT DETECTED   Bordetella pertussis NOT DETECTED NOT DETECTED   Bordetella Parapertussis NOT DETECTED NOT DETECTED   Chlamydophila pneumoniae NOT DETECTED NOT DETECTED   Mycoplasma pneumoniae NOT DETECTED NOT DETECTED  Resp panel by RT-PCR (RSV, Flu A&B, Covid) Anterior Nasal Swab     Status: None   Collection Time: 11/05/23  3:40 AM   Specimen: Anterior Nasal Swab  Result Value Ref Range   SARS Coronavirus 2 by RT PCR NEGATIVE NEGATIVE   Influenza A by PCR NEGATIVE NEGATIVE   Influenza B by PCR NEGATIVE NEGATIVE   Resp Syncytial Virus by PCR NEGATIVE NEGATIVE  CBC     Status: Abnormal   Collection Time: 11/05/23  4:05 AM  Result Value Ref Range   WBC 12.8 (H) 4.0 - 10.5 K/uL   RBC 3.98 3.87 - 5.11 MIL/uL   Hemoglobin 11.7 (L) 12.0 - 15.0 g/dL   HCT 16.1 (L) 09.6 - 04.5 %   MCV 86.9 80.0 - 100.0 fL   MCH 29.4 26.0 - 34.0 pg   MCHC 33.8 30.0  - 36.0 g/dL   RDW 40.9 81.1 - 91.4 %   Platelets 276 150 - 400 K/uL   nRBC 0.0 0.0 - 0.2 %  Comprehensive metabolic panel     Status: Abnormal   Collection Time: 11/05/23  4:05 AM  Result Value Ref Range   Sodium 134 (L) 135 - 145 mmol/L   Potassium 3.6 3.5 - 5.1  mmol/L   Chloride 107 98 - 111 mmol/L   CO2 23 22 - 32 mmol/L   Glucose, Bld 93 70 - 99 mg/dL   BUN 9 6 - 20 mg/dL   Creatinine, Ser 6.04 0.44 - 1.00 mg/dL   Calcium 8.4 (L) 8.9 - 10.3 mg/dL   Total Protein 5.5 (L) 6.5 - 8.1 g/dL   Albumin 2.6 (L) 3.5 - 5.0 g/dL   AST 19 15 - 41 U/L   ALT 31 0 - 44 U/L   Alkaline Phosphatase 62 38 - 126 U/L   Total Bilirubin <0.2 <1.2 mg/dL   GFR, Estimated >54 >09 mL/min   Anion gap 4 (L) 5 - 15   Imaging:  No results found.  MDM & MAU COURSE  MDM:  MAU Course: Orders Placed This Encounter  Procedures   Respiratory (~20 pathogens) panel by PCR   Resp panel by RT-PCR (RSV, Flu A&B, Covid) Anterior Nasal Swab   Urinalysis, Routine w reflex microscopic -Urine, Clean Catch   Protein / creatinine ratio, urine   CBC   Comprehensive metabolic panel   Droplet precaution   Discharge patient   Meds ordered this encounter  Medications   DISCONTD: lactated ringers bolus 1,000 mL   DISCONTD: ondansetron (ZOFRAN) injection 4 mg   ondansetron (ZOFRAN-ODT) disintegrating tablet 8 mg   Covid and respiratory panel ordered as well as zofran for nausea. Will monitor serial BP readings and get PEC labs.  Labs resulted, all normal and nausea resolved by PO zofran.  ASSESSMENT   1. Supervision of normal first pregnancy, antepartum   2. Maternal obesity, antepartum   3. Nausea and vomiting in pregnancy   4. Pregnancy headache in third trimester   5. NST (non-stress test) reactive   6. [redacted] weeks gestation of pregnancy    PLAN  Discharge home in stable condition with return precautions.     Follow-up Information      [facility] Follow up.   Why: as scheduled  for ongoing prenatal care Contact information: [address] [place] [address] [phone]                Allergies as of 11/05/2023   No Known Allergies      Medication List     TAKE these medications    aspirin EC 81 MG tablet Take 1 tablet (81 mg total) by mouth daily. Swallow whole.   Prenatal Vitamin 27-0.8 MG Tabs Take 1 tablet by mouth daily.       Edd Arbour, CNM, MSN, IBCLC Certified Nurse Midwife, [facility]

## 2023-11-09 ENCOUNTER — Ambulatory Visit: Payer: Managed Care, Other (non HMO) | Attending: Maternal & Fetal Medicine

## 2023-11-09 ENCOUNTER — Ambulatory Visit: Payer: Managed Care, Other (non HMO) | Admitting: *Deleted

## 2023-11-09 ENCOUNTER — Encounter: Payer: Self-pay | Admitting: Student

## 2023-11-09 ENCOUNTER — Encounter: Payer: Self-pay | Admitting: Obstetrics and Gynecology

## 2023-11-09 ENCOUNTER — Other Ambulatory Visit: Payer: Self-pay | Admitting: *Deleted

## 2023-11-09 VITALS — BP 125/63 | HR 93

## 2023-11-09 DIAGNOSIS — E669 Obesity, unspecified: Secondary | ICD-10-CM | POA: Diagnosis not present

## 2023-11-09 DIAGNOSIS — O99212 Obesity complicating pregnancy, second trimester: Secondary | ICD-10-CM | POA: Insufficient documentation

## 2023-11-09 DIAGNOSIS — O99213 Obesity complicating pregnancy, third trimester: Secondary | ICD-10-CM

## 2023-11-09 DIAGNOSIS — Z3A30 30 weeks gestation of pregnancy: Secondary | ICD-10-CM

## 2023-11-12 ENCOUNTER — Ambulatory Visit (INDEPENDENT_AMBULATORY_CARE_PROVIDER_SITE_OTHER): Payer: Managed Care, Other (non HMO) | Admitting: Family Medicine

## 2023-11-12 VITALS — BP 110/74 | HR 94 | Wt 301.0 lb

## 2023-11-12 DIAGNOSIS — O26892 Other specified pregnancy related conditions, second trimester: Secondary | ICD-10-CM

## 2023-11-12 DIAGNOSIS — Z6791 Unspecified blood type, Rh negative: Secondary | ICD-10-CM

## 2023-11-12 DIAGNOSIS — Z3A31 31 weeks gestation of pregnancy: Secondary | ICD-10-CM

## 2023-11-12 DIAGNOSIS — Z3400 Encounter for supervision of normal first pregnancy, unspecified trimester: Secondary | ICD-10-CM

## 2023-11-12 NOTE — Progress Notes (Signed)
ROB   CC: None   2hr GTT tomorrow. Pt has a few questions regarding test.

## 2023-11-13 ENCOUNTER — Other Ambulatory Visit: Payer: Managed Care, Other (non HMO)

## 2023-11-13 DIAGNOSIS — Z3400 Encounter for supervision of normal first pregnancy, unspecified trimester: Secondary | ICD-10-CM

## 2023-11-14 LAB — HIV ANTIBODY (ROUTINE TESTING W REFLEX): HIV Screen 4th Generation wRfx: NONREACTIVE

## 2023-11-14 LAB — GLUCOSE TOLERANCE, 2 HOURS W/ 1HR
Glucose, 1 hour: 149 mg/dL (ref 70–179)
Glucose, 2 hour: 122 mg/dL (ref 70–152)
Glucose, Fasting: 79 mg/dL (ref 70–91)

## 2023-11-14 LAB — RPR: RPR Ser Ql: NONREACTIVE

## 2023-11-14 NOTE — L&D Delivery Note (Signed)
 OB/GYN Faculty Practice Delivery Note  Anita [name] is a 28 y.o. G1P0000 s/p NSVD at [redacted]w[redacted]d. She was admitted for SOL.   ROM: 18h 66m with clear fluid GBS Status: Negative/-- (02/05 1620) Maximum Maternal Temperature: Temp (24hrs), Avg:98.5 F (36.9 C), Min:97.9 F (36.6 C), Max:98.9 F (37.2 C)   Labor Progress: Initial SVE: 5.5/100/-1. AROM and Pitocin required. She then progressed to complete.   Delivery Date/Time: 01/10/24 1816 Delivery: Called to room and patient was complete and pushing. Excellent maternal effort. Head delivered OA to LOA. No nuchal cord present. Shoulder and body delivered in usual fashion. Infant with spontaneous cry, placed on mother's abdomen, dried and stimulated. Cord clamped x 2 after +1-minute delay, and cut by FOB. Babe then taken to warmer for further stimulation. Cord gases not obtained. Cord blood drawn. Placenta delivered spontaneously with gentle cord traction. Fundus firm with massage and Pitocin. Labia, perineum, and vagina inspected with  2nd degree perineal and periurethral . 2nd degree repaired with 3-0 monocryl in the usual fashion. Periurethral hemostatic and did not require repair. Additional 30 cc 1% lidocaine used locally for anesthesia for repair. Mom and baby to postpartum. Babe with fever at delivery, fetal and maternal tachycardia do point towards developing chorio. Will given unasyn x1 postpartum. Baby Weight: pending  Placenta: 3 vessel, intact. Sent to home with patient Complications: Chorio Lacerations: 2nd degree perineal, periurethral EBL: 334 mL Anesthesia: local and epidural  Infant: baby girl [place] APGAR (1 MIN): 7  APGAR (5 MINS): 9  APGAR (10 MINS):    Joanne Gavel, MD [facility] Family Medicine Fellow, [facility] for [facility] 01/10/2024, 7:23 PM

## 2023-11-22 ENCOUNTER — Encounter: Payer: Self-pay | Admitting: Student

## 2023-11-26 ENCOUNTER — Ambulatory Visit (INDEPENDENT_AMBULATORY_CARE_PROVIDER_SITE_OTHER): Payer: Managed Care, Other (non HMO) | Admitting: Obstetrics and Gynecology

## 2023-11-26 VITALS — BP 126/80 | HR 92 | Wt 302.0 lb

## 2023-11-26 DIAGNOSIS — O99213 Obesity complicating pregnancy, third trimester: Secondary | ICD-10-CM

## 2023-11-26 DIAGNOSIS — O26893 Other specified pregnancy related conditions, third trimester: Secondary | ICD-10-CM

## 2023-11-26 DIAGNOSIS — Z3A33 33 weeks gestation of pregnancy: Secondary | ICD-10-CM

## 2023-11-26 DIAGNOSIS — Z23 Encounter for immunization: Secondary | ICD-10-CM

## 2023-11-26 DIAGNOSIS — E669 Obesity, unspecified: Secondary | ICD-10-CM

## 2023-11-26 DIAGNOSIS — Z6791 Unspecified blood type, Rh negative: Secondary | ICD-10-CM

## 2023-11-26 DIAGNOSIS — Z6841 Body Mass Index (BMI) 40.0 and over, adult: Secondary | ICD-10-CM | POA: Insufficient documentation

## 2023-11-26 DIAGNOSIS — K529 Noninfective gastroenteritis and colitis, unspecified: Secondary | ICD-10-CM

## 2023-11-26 DIAGNOSIS — O99210 Obesity complicating pregnancy, unspecified trimester: Secondary | ICD-10-CM

## 2023-11-26 NOTE — Progress Notes (Signed)
   PRENATAL VISIT NOTE  Subjective:  Anita [name] is a 28 y.o. G1P0000 at [redacted]w[redacted]d being seen today for ongoing prenatal care.  She is currently monitored for the following issues for this high-risk pregnancy and has Obesity (BMI 30-39.9); Supervision of normal first pregnancy, antepartum; WEAK D RH STATUS NEEDS RHOGAM; Maternal obesity, antepartum; and BMI 40.0-44.9, adult (HCC) on their problem list.  Patient reports  GI bug with n/v/d .  Contractions: Irritability. Vag. Bleeding: None.  Movement: Present. Denies leaking of fluid.   The following portions of the patient's history were reviewed and updated as appropriate: allergies, current medications, past family history, past medical history, past social history, past surgical history and problem list.   Objective:   Vitals:   11/26/23 1546  BP: 126/80  Pulse: 92  Weight: (!) 302 lb (137 kg)    Fetal Status: Fetal Heart Rate (bpm): 154   Movement: Present     General:  Alert, oriented and cooperative. Patient is in no acute distress.  Skin: Skin is warm and dry. No rash noted.   Cardiovascular: Normal heart rate noted  Respiratory: Normal respiratory effort, no problems with respiration noted  Abdomen: Soft, gravid, appropriate for gestational age.  Pain/Pressure: Present     Pelvic: Cervical exam deferred        Extremities: Normal range of motion.  Edema: None  Mental Status: Normal mood and affect. Normal behavior. Normal judgment and thought content.   Assessment and Plan:  Pregnancy: G1P0000 at [redacted]w[redacted]d 1. BMI 40.0-44.9, adult (HCC) (Primary) To start AP testing next week; weight stable 12/27: 1760gm, 62%, ac 65%, afi 14  2. Maternal obesity, antepartum  3. Rh negative status during pregnancy in third trimester S/p rhogam   4. [redacted] weeks gestation of pregnancy 28wk labs wnl  5. Gastroenteritis Behavioral interventions given. Declines pelvic for ? Pressure. Mau precautions given. Unable to leave u/a sample but no lower  urinary tract s/s.   Preterm labor symptoms and general obstetric precautions including but not limited to vaginal bleeding, contractions, leaking of fluid and fetal movement were reviewed in detail with the patient. Please refer to After Visit Summary for other counseling recommendations.   No follow-ups on file.  Future Appointments  Date Time Provider Department Center  12/05/2023  3:10 PM [facility] NST [facility]  12/12/2023  3:10 PM [facility] NST [facility]  12/12/2023  3:50 PM Fredirick Glenys RAMAN, MD [facility]  12/19/2023  3:30 PM Herchel Gloris LABOR, MD [facility]  12/21/2023  8:15 AM [facility] NURSE [facility]  12/21/2023  8:30 AM [facility] US2 [facility]  12/26/2023  3:10 PM [facility] NST [facility]  12/26/2023  3:50 PM Fredirick Glenys RAMAN, MD [facility]  01/02/2024  3:10 PM [facility] NST [facility]  01/02/2024  3:50 PM Fredirick Glenys RAMAN, MD [facility]  01/09/2024  3:50 PM Fredirick Glenys RAMAN, MD [facility]    Bebe Furry, MD

## 2023-11-26 NOTE — Addendum Note (Signed)
 Addended by: Kennon Portela on: 11/26/2023 04:17 PM   Modules accepted: Orders

## 2023-11-26 NOTE — Progress Notes (Signed)
 ROB   CC: Vaginal pressure, no bleeding, no LOF , no recent intercourse. Also notes diarrhea last Thursday. No vomiting. Note GI bug at work.    Pt not able to leave urine sample at this time.

## 2023-12-05 ENCOUNTER — Other Ambulatory Visit: Payer: Managed Care, Other (non HMO)

## 2023-12-06 ENCOUNTER — Other Ambulatory Visit (INDEPENDENT_AMBULATORY_CARE_PROVIDER_SITE_OTHER): Payer: Managed Care, Other (non HMO)

## 2023-12-06 ENCOUNTER — Ambulatory Visit (INDEPENDENT_AMBULATORY_CARE_PROVIDER_SITE_OTHER): Payer: Managed Care, Other (non HMO) | Admitting: *Deleted

## 2023-12-06 ENCOUNTER — Encounter: Payer: Self-pay | Admitting: Student

## 2023-12-06 VITALS — BP 123/78 | HR 92 | Wt 306.0 lb

## 2023-12-06 DIAGNOSIS — O99213 Obesity complicating pregnancy, third trimester: Secondary | ICD-10-CM | POA: Diagnosis not present

## 2023-12-06 DIAGNOSIS — Z6841 Body Mass Index (BMI) 40.0 and over, adult: Secondary | ICD-10-CM

## 2023-12-06 DIAGNOSIS — Z3A34 34 weeks gestation of pregnancy: Secondary | ICD-10-CM

## 2023-12-06 DIAGNOSIS — Z3400 Encounter for supervision of normal first pregnancy, unspecified trimester: Secondary | ICD-10-CM

## 2023-12-06 NOTE — Progress Notes (Signed)
Patient informed that the ultrasound is considered a limited obstetric ultrasound and is not intended to be a complete ultrasound exam.  Patient also informed that the ultrasound is not being completed with the intent of assessing for fetal or placental anomalies or any pelvic abnormalities. Explained that the purpose of today's ultrasound is to assess for fetal well being.  Patient acknowledges the purpose of the exam and the limitations of the study.      Rynell Ciotti, RN      

## 2023-12-12 ENCOUNTER — Ambulatory Visit: Payer: Managed Care, Other (non HMO) | Admitting: Family Medicine

## 2023-12-12 ENCOUNTER — Other Ambulatory Visit (INDEPENDENT_AMBULATORY_CARE_PROVIDER_SITE_OTHER): Payer: Managed Care, Other (non HMO)

## 2023-12-12 ENCOUNTER — Ambulatory Visit: Payer: Managed Care, Other (non HMO) | Admitting: *Deleted

## 2023-12-12 VITALS — BP 123/83 | HR 99 | Wt 309.0 lb

## 2023-12-12 DIAGNOSIS — Z6791 Unspecified blood type, Rh negative: Secondary | ICD-10-CM

## 2023-12-12 DIAGNOSIS — O99213 Obesity complicating pregnancy, third trimester: Secondary | ICD-10-CM | POA: Diagnosis not present

## 2023-12-12 DIAGNOSIS — O26893 Other specified pregnancy related conditions, third trimester: Secondary | ICD-10-CM

## 2023-12-12 DIAGNOSIS — Z3400 Encounter for supervision of normal first pregnancy, unspecified trimester: Secondary | ICD-10-CM

## 2023-12-12 DIAGNOSIS — Z6841 Body Mass Index (BMI) 40.0 and over, adult: Secondary | ICD-10-CM

## 2023-12-12 DIAGNOSIS — Z3A35 35 weeks gestation of pregnancy: Secondary | ICD-10-CM

## 2023-12-12 DIAGNOSIS — Z3483 Encounter for supervision of other normal pregnancy, third trimester: Secondary | ICD-10-CM

## 2023-12-12 NOTE — Progress Notes (Signed)

## 2023-12-13 NOTE — Progress Notes (Addendum)
   PRENATAL VISIT NOTE  Subjective:  Anita [name] is a 28 y.o. G1P0000 at [redacted]w[redacted]d being seen today for ongoing prenatal care.  She is currently monitored for the following issues for this low-risk pregnancy and has Obesity (BMI 30-39.9); Supervision of normal first pregnancy, antepartum; WEAK D RH STATUS NEEDS RHOGAM; Maternal obesity, antepartum; and BMI 40.0-44.9, adult (HCC) on their problem list.  Patient reports no complaints.  Contractions: Not present. Vag. Bleeding: None.  Movement: Present. Denies leaking of fluid.   The following portions of the patient's history were reviewed and updated as appropriate: allergies, current medications, past family history, past medical history, past social history, past surgical history and problem list.   Objective:   Vitals:   12/12/23 1556  BP: 123/83  Pulse: 99  Weight: (!) 309 lb (140.2 kg)    Fetal Status: Fetal Heart Rate (bpm): NST   Movement: Present     General:  Alert, oriented and cooperative. Patient is in no acute distress.  Skin: Skin is warm and dry. No rash noted.   Cardiovascular: Normal heart rate noted  Respiratory: Normal respiratory effort, no problems with respiration noted  Abdomen: Soft, gravid, appropriate for gestational age.  Pain/Pressure: Absent     Pelvic: Cervical exam deferred        Extremities: Normal range of motion.     Mental Status: Normal mood and affect. Normal behavior. Normal judgment and thought content.  NST:  Baseline: 140 bpm, Variability: Good {> 6 bpm), Accelerations: Reactive, and Decelerations: Absent  Assessment and Plan:  Pregnancy: G1P0000 at [redacted]w[redacted]d 1. Rh negative status during pregnancy in third trimester (Primary) S/p rhogam @ 28 weeks and pp if indicated  2. Supervision of normal first pregnancy, antepartum Discussed preparing for labor GBS next week.  3. [redacted] weeks gestation of pregnancy  4. BMI > 40 Reassuring fetal testing today  Preterm labor symptoms and general  obstetric precautions including but not limited to vaginal bleeding, contractions, leaking of fluid and fetal movement were reviewed in detail with the patient. Please refer to After Visit Summary for other counseling recommendations.   Return in 1 week (on 12/19/2023).  Future Appointments  Date Time Provider Department Center  12/19/2023  3:30 PM Tereso Newcomer, MD [facility]  12/21/2023  8:15 AM [facility] NURSE [facility]  12/21/2023  8:30 AM [facility] US2 [facility]  12/26/2023  3:10 PM [facility] NST [facility]  12/26/2023  3:50 PM Reva Bores, MD [facility]  01/02/2024  3:10 PM [facility] NST [facility]  01/02/2024  3:50 PM Reva Bores, MD [facility]  01/09/2024  3:50 PM Reva Bores, MD [facility]    Reva Bores, MD

## 2023-12-19 ENCOUNTER — Encounter: Payer: Self-pay | Admitting: Obstetrics & Gynecology

## 2023-12-19 ENCOUNTER — Other Ambulatory Visit: Payer: Managed Care, Other (non HMO)

## 2023-12-19 ENCOUNTER — Ambulatory Visit: Payer: Managed Care, Other (non HMO) | Admitting: Obstetrics & Gynecology

## 2023-12-19 ENCOUNTER — Other Ambulatory Visit (HOSPITAL_COMMUNITY)
Admission: RE | Admit: 2023-12-19 | Discharge: 2023-12-19 | Disposition: A | Discharge status: Home / Self Care | Payer: Managed Care, Other (non HMO) | Source: Ambulatory Visit | Attending: Obstetrics & Gynecology | Admitting: Obstetrics & Gynecology

## 2023-12-19 VITALS — BP 127/79 | HR 90 | Wt 310.4 lb

## 2023-12-19 DIAGNOSIS — Z3A36 36 weeks gestation of pregnancy: Secondary | ICD-10-CM | POA: Insufficient documentation

## 2023-12-19 DIAGNOSIS — Z3400 Encounter for supervision of normal first pregnancy, unspecified trimester: Secondary | ICD-10-CM | POA: Insufficient documentation

## 2023-12-19 DIAGNOSIS — O99210 Obesity complicating pregnancy, unspecified trimester: Secondary | ICD-10-CM | POA: Insufficient documentation

## 2023-12-19 NOTE — Patient Instructions (Signed)

## 2023-12-19 NOTE — Progress Notes (Signed)
 ROB: Having braxton hicks, pain and pressure with moving in the bed at night

## 2023-12-20 ENCOUNTER — Encounter: Payer: Self-pay | Admitting: Student

## 2023-12-20 NOTE — Progress Notes (Signed)
 PRENATAL VISIT NOTE  Subjective:  Anita [name] is a 28 y.o. G1P0000 at [redacted]w[redacted]d being seen today for ongoing prenatal care.  She is currently monitored for the following issues for this high-risk pregnancy and has Obesity (BMI 30-39.9); Supervision of normal first pregnancy, antepartum; WEAK D RH STATUS NEEDS RHOGAM; Maternal obesity, antepartum; and BMI 40.0-44.9, adult (HCC) on their problem list.  Patient reports occasional contractions.  Contractions: Irregular. Vag. Bleeding: None.  Movement: Present. Denies leaking of fluid.   The following portions of the patient's history were reviewed and updated as appropriate: allergies, current medications, past family history, past medical history, past social history, past surgical history and problem list.   Objective:   Vitals:   12/19/23 1554  BP: 127/79  Pulse: 90  Weight: (!) 310 lb 6.4 oz (140.8 kg)    Fetal Status: Fetal Heart Rate (bpm): 148   Movement: Present  Presentation: Vertex  General:  Alert, oriented and cooperative. Patient is in no acute distress.  Skin: Skin is warm and dry. No rash noted.   Cardiovascular: Normal heart rate noted  Respiratory: Normal respiratory effort, no problems with respiration noted  Abdomen: Soft, gravid, appropriate for gestational age.  Pain/Pressure: Present     Pelvic: Cervical exam performed in the presence of a chaperone Dilation: Closed Effacement (%): Thick Station: Ballotable, cultures obtained  Extremities: Normal range of motion.  Edema: None  Mental Status: Normal mood and affect. Normal behavior. Normal judgment and thought content.   US  FETAL BPP W/NONSTRESS Result Date: 12/13/2023 ----------------------------------------------------------------------  OBSTETRICS REPORT                       (Signed Final 12/13/2023 08:22 am) ---------------------------------------------------------------------- Patient Info  ID #:       [id number]                          D.O.B.:  [birth year] (27  yrs)(F)  Name:       Anita [name]                 Visit Date: 12/06/2023 04:10 pm ---------------------------------------------------------------------- Performed By  Attending:        Glenys Birk MD         Ref. Address:     [facility]                                                             [facility]                                                             [address]  [place], [place]                                                             [address]  Performed By:     Wanda Buckles RN     Location:         [facility] for                                                             [facility]  Referred By:      OTTO ONEIDA FAIRLY MD ---------------------------------------------------------------------- Orders  #  Description                           Code        Ordered By  1  US  FETAL BPP W/NONSTRESS              23181.5     GLORIS HUGGER ----------------------------------------------------------------------  #  Order #                     Accession #                Episode #  1  [id number]                   [id number]                 [id number] ---------------------------------------------------------------------- Indications  [redacted] weeks gestation of pregnancy                Z3A.34  Maternal morbid obesity                        O99.210 E66.01 ---------------------------------------------------------------------- Fetal Evaluation  Num Of Fetuses:         1  Cardiac Activity:       Observed  Presentation:           Cephalic  Amniotic Fluid  AFI FV:      Within normal limits  AFI Sum(cm)     %Tile       Largest Pocket(cm)  9.4             15  3.53  RUQ(cm)       RLQ(cm)       LUQ(cm)        LLQ(cm)  2.61          1.71           3.53           1.55 ---------------------------------------------------------------------- Biophysical Evaluation  Amniotic F.V:   Within normal limits       F. Tone:        Observed  F. Movement:    Observed                   N.S.T:          Reactive  F. Breathing:   Observed                   Score:          10/10 ---------------------------------------------------------------------- OB History  Gravidity:    1 ---------------------------------------------------------------------- Gestational Age  LMP:           34w 4d        Date:  04/08/23                  EDD:   01/13/24  Best:          34w 4d     Det. By:  LMP  (04/08/23)          EDD:   01/13/24 ---------------------------------------------------------------------- Impression  IUP @ 34 4/7 weeks  BMI > 40  BPP 8/8 ---------------------------------------------------------------------- Recommendations  Continue weekly antenatal testing till delivery . ----------------------------------------------------------------------                  Glenys Birk, MD Electronically Signed Final Report   12/13/2023 08:22 am ----------------------------------------------------------------------   US  FETAL BPP W/NONSTRESS Result Date: 12/13/2023 ----------------------------------------------------------------------  OBSTETRICS REPORT                       (Signed Final 12/13/2023 08:18 am) ---------------------------------------------------------------------- Patient Info  ID #:       [id number]                          D.O.B.:  27-Jul-1996 (27 yrs)(F)  Name:       Anita [name]                 Visit Date: 12/12/2023 04:40 pm ---------------------------------------------------------------------- Performed By  Attending:        Glenys Birk MD         Ref. Address:     [facility]                                                             [facility]                                                             [address]  [address]                                                             [place], [place]                                                             [address]  Performed By:     Wanda Buckles RN     Location:         [facility] for                                                             [facility]  Referred By:      OTTO ONEIDA FAIRLY MD ---------------------------------------------------------------------- Orders  #  Description                           Code        Ordered By  1  US  FETAL BPP W/NONSTRESS              23181.5     GLENYS BIRK ----------------------------------------------------------------------  #  Order #                     Accession #                Episode #  1  [id number]                   [id number]                 [id number] ---------------------------------------------------------------------- Indications  [redacted] weeks gestation of pregnancy                Z3A.35  Obesity complicating pregnancy                 O99.210 E66.9 ---------------------------------------------------------------------- Fetal Evaluation  Num Of Fetuses:         1  Cardiac Activity:       Observed  Presentation:  Cephalic  Amniotic Fluid  AFI FV:      Within normal limits  AFI Sum(cm)     %Tile       Largest Pocket(cm)  10.32           23          3.08  RUQ(cm)       RLQ(cm)       LUQ(cm)        LLQ(cm)  2.72          1.75          2.77           3.08 ---------------------------------------------------------------------- Biophysical Evaluation  Amniotic F.V:   Within normal limits       F. Tone:        Observed  F. Movement:    Observed                   N.S.T:          Reactive  F. Breathing:   Observed                   Score:          10/10 ---------------------------------------------------------------------- OB History  Gravidity:    1  ---------------------------------------------------------------------- Gestational Age  LMP:           35w 3d        Date:  04/08/23                  EDD:   01/13/24  Best:          jerri 3d     Det. By:  LMP  (04/08/23)          EDD:   01/13/24 ---------------------------------------------------------------------- Impression  IUP @ 35 weeks  BMI > 40  BPP 8/8 ---------------------------------------------------------------------- Recommendations  Continue weekly antenatal testing till delivery . ----------------------------------------------------------------------                  Glenys Birk, MD Electronically Signed Final Report   12/13/2023 08:18 am ----------------------------------------------------------------------    Assessment and Plan:  Pregnancy: G1P0000 at [redacted]w[redacted]d 1. Maternal morbid obesity, antepartum (HCC) (Primary) Continue weekly antenatal testing.  2. [redacted] weeks gestation of pregnancy 3. Supervision of normal first pregnancy, antepartum Cultures done today, will follow up results and manage accordingly. - Cervicovaginal ancillary only - Strep Gp B NAA Preterm labor symptoms and general obstetric precautions including but not limited to vaginal bleeding, contractions, leaking of fluid and fetal movement were reviewed in detail with the patient. Please refer to After Visit Summary for other counseling recommendations.   Return for OB visits and antenatal testing as scheduled.  Future Appointments  Date Time Provider Department Center  12/21/2023  8:15 AM [facility] NURSE [facility]  12/21/2023  8:30 AM [facility] US2 [facility]  12/26/2023  3:10 PM [facility] NST [facility]  12/26/2023  3:50 PM Birk Glenys RAMAN, MD [facility]  01/02/2024  3:10 PM [facility] NST [facility]  01/02/2024  3:50 PM Birk Glenys RAMAN, MD [facility]  01/09/2024  3:50 PM Birk Glenys RAMAN, MD [facility]    Gloris Hugger, MD

## 2023-12-21 ENCOUNTER — Encounter: Payer: Self-pay | Admitting: Obstetrics & Gynecology

## 2023-12-21 ENCOUNTER — Ambulatory Visit: Payer: Managed Care, Other (non HMO) | Admitting: *Deleted

## 2023-12-21 ENCOUNTER — Ambulatory Visit (HOSPITAL_BASED_OUTPATIENT_CLINIC_OR_DEPARTMENT_OTHER): Payer: Managed Care, Other (non HMO) | Admitting: Maternal & Fetal Medicine

## 2023-12-21 ENCOUNTER — Ambulatory Visit: Payer: Managed Care, Other (non HMO) | Attending: Maternal & Fetal Medicine

## 2023-12-21 VITALS — BP 138/69 | HR 79

## 2023-12-21 DIAGNOSIS — O3660X0 Maternal care for excessive fetal growth, unspecified trimester, not applicable or unspecified: Secondary | ICD-10-CM

## 2023-12-21 DIAGNOSIS — Z3A36 36 weeks gestation of pregnancy: Secondary | ICD-10-CM

## 2023-12-21 DIAGNOSIS — O99213 Obesity complicating pregnancy, third trimester: Secondary | ICD-10-CM | POA: Diagnosis not present

## 2023-12-21 DIAGNOSIS — O3663X0 Maternal care for excessive fetal growth, third trimester, not applicable or unspecified: Secondary | ICD-10-CM

## 2023-12-21 DIAGNOSIS — E669 Obesity, unspecified: Secondary | ICD-10-CM | POA: Diagnosis not present

## 2023-12-21 DIAGNOSIS — Z6841 Body Mass Index (BMI) 40.0 and over, adult: Secondary | ICD-10-CM

## 2023-12-21 LAB — CERVICOVAGINAL ANCILLARY ONLY
Chlamydia: NEGATIVE
Comment: NEGATIVE
Comment: NORMAL
Neisseria Gonorrhea: NEGATIVE

## 2023-12-21 LAB — STREP GP B NAA: Strep Gp B NAA: NEGATIVE

## 2023-12-21 NOTE — Progress Notes (Signed)
   Patient information  Patient Name: Anita [name]  Patient MRN:   [id number]  Referring practice: MFM Referring Provider: [facility]  MFM CONSULT  Tashanda [name] is a 28 y.o. G1P0000 at 102w5d here for ultrasound and consultation. Patient Active Problem List   Diagnosis Date Noted   LGA (large for gestational age) fetus affecting mother, antepartum 12/21/2023   BMI 40.0-44.9, adult (HCC) 11/26/2023   Maternal obesity, antepartum 10/23/2023   WEAK D RH STATUS NEEDS RHOGAM 09/17/2023   Supervision of normal first pregnancy, antepartum 07/06/2023   Obesity (BMI 30-39.9) 12/19/2022    Anita [name] is doing well today with no acute concerns. She denies contractions, bleeding, or loss of fluid and reports good fetal movement.   RE large for gestational age: Currently the fetus is weighing in the 93rd percentile approximately 7 pounds 13 ounces.  The abdominal circumference is greater than 99th percentile measuring at 39 weeks and 4 days.  I discussed the concerns for fetal macrosomia/large for gestational age during pregnancy and the need for close monitoring.  I discussed the role of limiting carbohydrates and sugar as well as considering a membrane sweep around 38 weeks to encourage spontaneous labor by 39 weeks.  I discussed induction in the 30/9-week of pregnancy may be advisable to reduce the risk of cesarean delivery.  Sonographic findings Single intrauterine pregnancy at 36w 5d.  Fetal cardiac activity:  Observed and appears normal. Presentation: Cephalic. Interval fetal anatomy appears normal. Fetal biometry shows the estimated fetal weight at the 94 percentile. Amniotic fluid volume: Within normal limits. MVP: 4.74 cm. Placenta: Anterior. BPP 8/8.   There are limitations of prenatal ultrasound such as the inability to detect certain abnormalities due to poor visualization. Various factors such as fetal position, gestational age and maternal body habitus  may increase the difficulty in visualizing the fetal anatomy.    Recommendations -Continue weekly antenatal testing -Consider membrane sweep at 38 weeks -Consider induction in the 39 th week of pregnancy  -Continue routine prenatal care with referring OB provider  Review of Systems: A review of systems was performed and was negative except per HPI   Vitals and Physical Exam    12/21/2023    8:28 AM 12/19/2023    3:54 PM 12/12/2023    3:56 PM  Vitals with BMI  Weight  310 lbs 6 oz 309 lbs  Systolic 138 127 876  Diastolic 69 79 83  Pulse 79 90 99    Sitting comfortably on the sonogram table Nonlabored breathing Normal rate and rhythm Abdomen is nontender  Past pregnancies OB History  Gravida Para Term Preterm AB Living  1 0 0 0 0 0  SAB IAB Ectopic Multiple Live Births  0 0 0 0 0    # Outcome Date GA Lbr Len/2nd Weight Sex Type Anes PTL Lv  1 Current              I spent 10 minutes reviewing the patients chart, including labs and images as well as counseling the patient about her medical conditions. Greater than 50% of the time was spent in direct face-to-face patient counseling.  [name]  MFM, [facility]   12/21/2023  9:17 AM

## 2023-12-24 ENCOUNTER — Encounter: Payer: Self-pay | Admitting: Obstetrics & Gynecology

## 2023-12-26 ENCOUNTER — Ambulatory Visit (INDEPENDENT_AMBULATORY_CARE_PROVIDER_SITE_OTHER): Payer: Managed Care, Other (non HMO) | Admitting: Family Medicine

## 2023-12-26 ENCOUNTER — Other Ambulatory Visit (INDEPENDENT_AMBULATORY_CARE_PROVIDER_SITE_OTHER): Payer: Managed Care, Other (non HMO)

## 2023-12-26 ENCOUNTER — Ambulatory Visit: Payer: Managed Care, Other (non HMO) | Admitting: *Deleted

## 2023-12-26 VITALS — BP 128/79 | HR 81 | Wt 310.0 lb

## 2023-12-26 DIAGNOSIS — Z6841 Body Mass Index (BMI) 40.0 and over, adult: Secondary | ICD-10-CM | POA: Diagnosis not present

## 2023-12-26 DIAGNOSIS — Z3400 Encounter for supervision of normal first pregnancy, unspecified trimester: Secondary | ICD-10-CM

## 2023-12-26 DIAGNOSIS — O99213 Obesity complicating pregnancy, third trimester: Secondary | ICD-10-CM

## 2023-12-26 DIAGNOSIS — Z3A37 37 weeks gestation of pregnancy: Secondary | ICD-10-CM | POA: Diagnosis not present

## 2023-12-26 DIAGNOSIS — O3660X0 Maternal care for excessive fetal growth, unspecified trimester, not applicable or unspecified: Secondary | ICD-10-CM

## 2023-12-26 DIAGNOSIS — O99210 Obesity complicating pregnancy, unspecified trimester: Secondary | ICD-10-CM

## 2023-12-26 NOTE — Progress Notes (Signed)

## 2023-12-26 NOTE — Progress Notes (Signed)
   PRENATAL VISIT NOTE  Subjective:  Anita [name] is a 28 y.o. G1P0000 at [redacted]w[redacted]d being seen today for ongoing prenatal care.  She is currently monitored for the following issues for this low-risk pregnancy and has Supervision of normal first pregnancy, antepartum; WEAK D RH STATUS NEEDS RHOGAM; Maternal obesity, antepartum; BMI 40.0-44.9, adult (HCC); and LGA (large for gestational age) fetus affecting mother, antepartum on their problem list.  Patient reports no complaints.  Contractions: Not present. Vag. Bleeding: None.  Movement: Present. Denies leaking of fluid.   The following portions of the patient's history were reviewed and updated as appropriate: allergies, current medications, past family history, past medical history, past social history, past surgical history and problem list.   Objective:   Vitals:   12/26/23 1559  BP: 128/79  Pulse: 81  Weight: (!) 310 lb (140.6 kg)    Fetal Status: Fetal Heart Rate (bpm): NST   Movement: Present     General:  Alert, oriented and cooperative. Patient is in no acute distress.  Skin: Skin is warm and dry. No rash noted.   Cardiovascular: Normal heart rate noted  Respiratory: Normal respiratory effort, no problems with respiration noted  Abdomen: Soft, gravid, appropriate for gestational age.  Pain/Pressure: Absent     Pelvic: Cervical exam deferred        Extremities: Normal range of motion.     Mental Status: Normal mood and affect. Normal behavior. Normal judgment and thought content.  NST:  Baseline: 125 bpm, Variability: Good {> 6 bpm), Accelerations: Reactive, and Decelerations: Absent  Assessment and Plan:  Pregnancy: G1P0000 at [redacted]w[redacted]d 1. Supervision of normal first pregnancy, antepartum (Primary) Continue routine prenatal care.  2. Excessive fetal growth affecting pregnancy, antepartum, single or unspecified fetus Declines IOL, she is tall and will likely labor well  3. BMI 40.0-44.9, adult (HCC) In antenatal  testing  4. [redacted] weeks gestation of pregnancy   Term labor symptoms and general obstetric precautions including but not limited to vaginal bleeding, contractions, leaking of fluid and fetal movement were reviewed in detail with the patient. Please refer to After Visit Summary for other counseling recommendations.   Return in 1 week (on 01/02/2024).  Future Appointments  Date Time Provider Department Center  01/02/2024  3:10 PM [facility] NST [facility]  01/02/2024  3:50 PM Reva Bores, MD [facility]  01/09/2024  3:50 PM Reva Bores, MD [facility]    Reva Bores, MD

## 2023-12-27 ENCOUNTER — Encounter: Payer: Self-pay | Admitting: Student

## 2024-01-01 ENCOUNTER — Ambulatory Visit: Payer: Managed Care, Other (non HMO) | Admitting: *Deleted

## 2024-01-01 ENCOUNTER — Other Ambulatory Visit (INDEPENDENT_AMBULATORY_CARE_PROVIDER_SITE_OTHER): Payer: Self-pay

## 2024-01-01 ENCOUNTER — Ambulatory Visit: Payer: Managed Care, Other (non HMO) | Admitting: Obstetrics and Gynecology

## 2024-01-01 VITALS — BP 122/85 | HR 82 | Wt 310.0 lb

## 2024-01-01 DIAGNOSIS — Z3A38 38 weeks gestation of pregnancy: Secondary | ICD-10-CM | POA: Diagnosis not present

## 2024-01-01 DIAGNOSIS — Z3400 Encounter for supervision of normal first pregnancy, unspecified trimester: Secondary | ICD-10-CM

## 2024-01-01 DIAGNOSIS — O99213 Obesity complicating pregnancy, third trimester: Secondary | ICD-10-CM

## 2024-01-01 DIAGNOSIS — Z6841 Body Mass Index (BMI) 40.0 and over, adult: Secondary | ICD-10-CM

## 2024-01-01 DIAGNOSIS — O26893 Other specified pregnancy related conditions, third trimester: Secondary | ICD-10-CM

## 2024-01-01 DIAGNOSIS — O99210 Obesity complicating pregnancy, unspecified trimester: Secondary | ICD-10-CM | POA: Diagnosis not present

## 2024-01-01 DIAGNOSIS — Z6791 Unspecified blood type, Rh negative: Secondary | ICD-10-CM

## 2024-01-01 DIAGNOSIS — O3660X0 Maternal care for excessive fetal growth, unspecified trimester, not applicable or unspecified: Secondary | ICD-10-CM | POA: Diagnosis not present

## 2024-01-01 DIAGNOSIS — E669 Obesity, unspecified: Secondary | ICD-10-CM

## 2024-01-01 NOTE — Progress Notes (Signed)

## 2024-01-02 ENCOUNTER — Encounter: Payer: Managed Care, Other (non HMO) | Admitting: Family Medicine

## 2024-01-02 ENCOUNTER — Other Ambulatory Visit: Payer: Managed Care, Other (non HMO)

## 2024-01-02 ENCOUNTER — Encounter (HOSPITAL_COMMUNITY): Payer: Self-pay | Admitting: Obstetrics and Gynecology

## 2024-01-02 ENCOUNTER — Inpatient Hospital Stay (HOSPITAL_COMMUNITY)
Admission: AD | Admit: 2024-01-02 | Discharge: 2024-01-02 | Disposition: A | Discharge status: Home / Self Care | Payer: Managed Care, Other (non HMO) | Attending: Obstetrics and Gynecology | Admitting: Obstetrics and Gynecology

## 2024-01-02 DIAGNOSIS — O368130 Decreased fetal movements, third trimester, not applicable or unspecified: Secondary | ICD-10-CM

## 2024-01-02 DIAGNOSIS — Z3A38 38 weeks gestation of pregnancy: Secondary | ICD-10-CM

## 2024-01-02 DIAGNOSIS — O288 Other abnormal findings on antenatal screening of mother: Secondary | ICD-10-CM

## 2024-01-02 NOTE — MAU Note (Signed)
..  Anita [name] is a 28 y.o. at [redacted]w[redacted]d here in MAU reporting: weaker movements since 9 pm. Has felt baby move since she has been in MAU Denies vaginal bleeding, leaking of fluid, or pain.   Pain score: 0/10 Vitals:   01/02/24 2215  BP: 124/74  Pulse: 73  Resp: 19  Temp: 98 F (36.7 C)  SpO2: 100%     FHT:135 Lab orders placed from triage:  none

## 2024-01-02 NOTE — MAU Provider Note (Signed)
Chief Complaint:  Decreased Fetal Movement   Event Date/Time   First Provider Initiated Contact with Patient 01/02/24 2246     HPI: Anita [name] is a 28 y.o. G1P0000 at 50w3dwho presents to maternity admissions reporting decreased fetal movement    States feels normal movement now. . She reports good fetal movement, denies LOF, vaginal bleeding, vaginal itching/burning, urinary symptoms, h/a, dizziness, n/v, diarrhea, constipation or fever/chills.  She denies headache, visual changes or RUQ abdominal pain.  Other This is a new problem. The current episode started today. The problem has been resolved. Pertinent negatives include no abdominal pain, chills, fever, myalgias or nausea. Nothing aggravates the symptoms.   RN Note: Anita [name] is a 28 y.o. at [redacted]w[redacted]d here in MAU reporting: weaker movements since 9 pm. Has felt baby move since she has been in MAU Denies vaginal bleeding, leaking of fluid, or pain.   Past Medical History: Past Medical History:  Diagnosis Date   Chlamydia infection    Depression 01/17/2018   Lipoma 05/03/2021   Migraine without aura 01/31/2007   Qualifier: Diagnosis of   By: Deirdre Priest MD, Alesia Richards     Past obstetric history: OB History  Gravida Para Term Preterm AB Living  1 0 0 0 0 0  SAB IAB Ectopic Multiple Live Births  0 0 0 0 0    # Outcome Date GA Lbr Len/2nd Weight Sex Type Anes PTL Lv  1 Current             Past Surgical History: Past Surgical History:  Procedure Laterality Date   APPENDECTOMY     BARTHOLIN CYST MARSUPIALIZATION N/A 12/31/2019   Procedure: BARTHOLIN CYST MARSUPIALIZATION;  Surgeon: Theresia Majors, MD;  Location: [facility];  Service: Gynecology;  Laterality: N/A;   Nexplanon      Inserted 09-14-17    Family History: Family History  Problem Relation Age of Onset   Asthma Mother    Healthy Father    Epilepsy Sister    Migraines Maternal Grandfather     Social History: Social  History   Tobacco Use   Smoking status: Never    Passive exposure: Never   Smokeless tobacco: Never  Vaping Use   Vaping status: Never Used  Substance Use Topics   Alcohol use: Not Currently    Comment: occasionally   Drug use: No    Allergies: No Known Allergies  Meds:  Medications Prior to Admission  Medication Sig Dispense Refill Last Dose/Taking   aspirin EC 81 MG tablet Take 1 tablet (81 mg total) by mouth daily. Swallow whole. 30 tablet 12 01/01/2024   Prenatal Vit-Fe Fumarate-FA (PRENATAL VITAMINS PO) Take by mouth. OTC per pt   01/01/2024    I have reviewed patient's Past Medical Hx, Surgical Hx, Family Hx, Social Hx, medications and allergies.   ROS:  Review of Systems  Constitutional:  Negative for chills and fever.  Gastrointestinal:  Negative for abdominal pain and nausea.  Musculoskeletal:  Negative for myalgias.   Other systems negative  Physical Exam  Patient Vitals for the past 24 hrs:  BP Temp Temp src Pulse Resp SpO2 Height Weight  01/02/24 2215 124/74 98 F (36.7 C) Oral 73 19 100 % -- --  01/02/24 2213 -- -- -- -- -- -- 5\' 9"  (1.753 m) (!) 142.2 kg   Constitutional: Well-developed, well-nourished female in no acute distress.  Cardiovascular: normal rate  Respiratory: normal effort  GI: Abd soft, non-tender, gravid appropriate for gestational age.   No rebound or guarding. MS: Extremities nontender, no edema, normal ROM Neurologic: Alert and oriented x 4.   FHT:  Baseline 145 , moderate variability, accelerations present, no decelerations Contractions:  Irregular     Labs: No results found for this or any previous visit (from the past 24 hours). A/Weak D/-- (07/26 1658)  Imaging:  Normal BPP in AM in office                                                             MAU Course/MDM: I have reviewed the triage vital signs and the nursing notes.   Pertinent labs & imaging results that were available during my care of the patient were reviewed by  me and considered in my medical decision making (see chart for details).      I have reviewed her medical records including past results, notes and treatments.  .  NST reviewed, reactive throughout  Treatments in MAU included EFM.    Assessment: Single IUP at [redacted]w[redacted]d Decreased fetal movement, resolved  Reactive nonstress test  Plan: Discharge home Labor precautions and fetal kick counts Follow up in Office for prenatal visits and recheck Encouraged to return if she develops worsening of symptoms, increase in pain, fever, or other concerning symptoms.   Pt stable at time of discharge.  Wynelle Bourgeois CNM, MSN Certified Nurse-Midwife 01/02/2024 10:46 PM

## 2024-01-03 ENCOUNTER — Encounter: Payer: Self-pay | Admitting: Student

## 2024-01-03 NOTE — Progress Notes (Signed)
   PRENATAL VISIT NOTE  Subjective:  Anita [name] is a 28 y.o. G1P0000 at [redacted]w[redacted]d being seen today for ongoing prenatal care.  She is currently monitored for the following issues for this high-risk pregnancy and has Supervision of normal first pregnancy, antepartum; WEAK D RH STATUS NEEDS RHOGAM; Maternal obesity, antepartum; BMI 40.0-44.9, adult (HCC); and LGA (large for gestational age) fetus affecting mother, antepartum on their problem list.  Patient reports no complaints.   .  .   . Denies leaking of fluid, bleeding, decreased fetal movement, contractions  The following portions of the patient's history were reviewed and updated as appropriate: allergies, current medications, past family history, past medical history, past social history, past surgical history and problem list.   Objective:   Vitals:   01/01/24 1530  BP: 122/85  Pulse: 82  Weight: (!) 310 lb (140.6 kg)    Fetal Status:           General:  Alert, oriented and cooperative. Patient is in no acute distress.  Skin: Skin is warm and dry. No rash noted.   Cardiovascular: Normal heart rate noted  Respiratory: Normal respiratory effort, no problems with respiration noted  Abdomen: Soft, gravid, appropriate for gestational age.        Pelvic: Cervical exam deferred        Extremities: Normal range of motion.     Mental Status: Normal mood and affect. Normal behavior. Normal judgment and thought content.   Assessment and Plan:  Pregnancy: G1P0000 at [redacted]w[redacted]d 1. [redacted] weeks gestation of pregnancy (Primary) GBS neg  2. Excessive fetal growth affecting pregnancy, antepartum, single or unspecified fetus 2/7: 94%, 3542gm, ac >99%, afi 13, bpp 8/8, cephalic; s/p normal third trimester GTT  3. Maternal obesity, antepartum Declines 39-40wk IOL with rationale given. Follow up next visit BPP cephalic, afi 15.8, 10/10 today; continue with weekly testing   4. BMI 40.0-44.9, adult (HCC)  5. Supervision of normal first pregnancy,  antepartum  6. Rh negative status during pregnancy in third trimester Treating as Rh neg.   Term labor symptoms and general obstetric precautions including but not limited to vaginal bleeding, contractions, leaking of fluid and fetal movement were reviewed in detail with the patient. Please refer to After Visit Summary for other counseling recommendations.   No follow-ups on file.  Future Appointments  Date Time Provider Department Center  01/09/2024  2:10 PM [facility]  01/09/2024  3:50 PM Reva Bores, MD [facility]    Philomath Bing, MD

## 2024-01-08 ENCOUNTER — Encounter: Payer: Self-pay | Admitting: Family Medicine

## 2024-01-09 ENCOUNTER — Encounter (HOSPITAL_COMMUNITY): Payer: Self-pay | Admitting: Obstetrics & Gynecology

## 2024-01-09 ENCOUNTER — Telehealth: Payer: Self-pay | Admitting: *Deleted

## 2024-01-09 ENCOUNTER — Other Ambulatory Visit (INDEPENDENT_AMBULATORY_CARE_PROVIDER_SITE_OTHER): Payer: Self-pay

## 2024-01-09 ENCOUNTER — Ambulatory Visit: Payer: Managed Care, Other (non HMO) | Admitting: Family Medicine

## 2024-01-09 ENCOUNTER — Encounter: Payer: Managed Care, Other (non HMO) | Admitting: Family Medicine

## 2024-01-09 ENCOUNTER — Other Ambulatory Visit: Payer: Self-pay

## 2024-01-09 ENCOUNTER — Ambulatory Visit: Payer: Managed Care, Other (non HMO) | Admitting: *Deleted

## 2024-01-09 ENCOUNTER — Inpatient Hospital Stay (HOSPITAL_COMMUNITY)
Admission: AD | Admit: 2024-01-09 | Discharge: 2024-01-12 | DRG: 807 | Disposition: A | Discharge status: Home / Self Care | Payer: Managed Care, Other (non HMO) | Attending: Obstetrics and Gynecology | Admitting: Obstetrics and Gynecology

## 2024-01-09 ENCOUNTER — Inpatient Hospital Stay (HOSPITAL_COMMUNITY): Payer: Managed Care, Other (non HMO) | Admitting: Anesthesiology

## 2024-01-09 ENCOUNTER — Other Ambulatory Visit: Payer: Managed Care, Other (non HMO)

## 2024-01-09 VITALS — BP 127/87 | HR 90 | Wt 309.0 lb

## 2024-01-09 DIAGNOSIS — O411230 Chorioamnionitis, third trimester, not applicable or unspecified: Secondary | ICD-10-CM | POA: Diagnosis not present

## 2024-01-09 DIAGNOSIS — Z6841 Body Mass Index (BMI) 40.0 and over, adult: Secondary | ICD-10-CM

## 2024-01-09 DIAGNOSIS — O139 Gestational [pregnancy-induced] hypertension without significant proteinuria, unspecified trimester: Secondary | ICD-10-CM | POA: Diagnosis not present

## 2024-01-09 DIAGNOSIS — E66813 Obesity, class 3: Secondary | ICD-10-CM | POA: Diagnosis present

## 2024-01-09 DIAGNOSIS — Z3403 Encounter for supervision of normal first pregnancy, third trimester: Secondary | ICD-10-CM

## 2024-01-09 DIAGNOSIS — Z3400 Encounter for supervision of normal first pregnancy, unspecified trimester: Secondary | ICD-10-CM

## 2024-01-09 DIAGNOSIS — O3663X0 Maternal care for excessive fetal growth, third trimester, not applicable or unspecified: Secondary | ICD-10-CM | POA: Diagnosis present

## 2024-01-09 DIAGNOSIS — O26899 Other specified pregnancy related conditions, unspecified trimester: Secondary | ICD-10-CM

## 2024-01-09 DIAGNOSIS — Z6791 Unspecified blood type, Rh negative: Secondary | ICD-10-CM

## 2024-01-09 DIAGNOSIS — O26893 Other specified pregnancy related conditions, third trimester: Secondary | ICD-10-CM | POA: Diagnosis present

## 2024-01-09 DIAGNOSIS — Z3A39 39 weeks gestation of pregnancy: Secondary | ICD-10-CM

## 2024-01-09 DIAGNOSIS — O134 Gestational [pregnancy-induced] hypertension without significant proteinuria, complicating childbirth: Secondary | ICD-10-CM | POA: Diagnosis present

## 2024-01-09 DIAGNOSIS — O99210 Obesity complicating pregnancy, unspecified trimester: Secondary | ICD-10-CM

## 2024-01-09 DIAGNOSIS — O99213 Obesity complicating pregnancy, third trimester: Secondary | ICD-10-CM | POA: Diagnosis not present

## 2024-01-09 DIAGNOSIS — Z7982 Long term (current) use of aspirin: Secondary | ICD-10-CM

## 2024-01-09 DIAGNOSIS — O99214 Obesity complicating childbirth: Secondary | ICD-10-CM | POA: Diagnosis present

## 2024-01-09 DIAGNOSIS — O3660X0 Maternal care for excessive fetal growth, unspecified trimester, not applicable or unspecified: Secondary | ICD-10-CM | POA: Diagnosis not present

## 2024-01-09 DIAGNOSIS — E669 Obesity, unspecified: Secondary | ICD-10-CM

## 2024-01-09 LAB — CBC
HCT: 37.5 % (ref 36.0–46.0)
Hemoglobin: 13.1 g/dL (ref 12.0–15.0)
MCH: 29.7 pg (ref 26.0–34.0)
MCHC: 34.9 g/dL (ref 30.0–36.0)
MCV: 85 fL (ref 80.0–100.0)
Platelets: 300 10*3/uL (ref 150–400)
RBC: 4.41 MIL/uL (ref 3.87–5.11)
RDW: 13.6 % (ref 11.5–15.5)
WBC: 12.7 10*3/uL — ABNORMAL HIGH (ref 4.0–10.5)
nRBC: 0 % (ref 0.0–0.2)

## 2024-01-09 LAB — URINALYSIS, ROUTINE W REFLEX MICROSCOPIC
Bilirubin Urine: NEGATIVE
Glucose, UA: NEGATIVE mg/dL
Ketones, ur: NEGATIVE mg/dL
Nitrite: NEGATIVE
Protein, ur: 30 mg/dL — AB
Specific Gravity, Urine: 1.029 (ref 1.005–1.030)
pH: 5 (ref 5.0–8.0)

## 2024-01-09 MED ORDER — EPHEDRINE 5 MG/ML INJ: 10.0000 mg | INTRAVENOUS | Status: DC | PRN

## 2024-01-09 MED ORDER — LACTATED RINGERS IV SOLN
500.0000 mL | INTRAVENOUS | Status: DC | PRN
Start: 2024-01-09 — End: 2024-01-10

## 2024-01-09 MED ORDER — LIDOCAINE HCL (PF) 1 % IJ SOLN
INTRAMUSCULAR | Status: DC | PRN
  Administered 2024-01-09 (×2): 4 mL via EPIDURAL

## 2024-01-09 MED ORDER — FENTANYL-BUPIVACAINE-NACL 0.5-0.125-0.9 MG/250ML-% EP SOLN
12.0000 mL/h | EPIDURAL | Status: DC | PRN
  Administered 2024-01-09: 12 mL/h via EPIDURAL
  Filled 2024-01-09 (×2): qty 250

## 2024-01-09 MED ORDER — OXYTOCIN BOLUS FROM INFUSION: 333.0000 mL | Freq: Once | INTRAVENOUS | Status: DC

## 2024-01-09 MED ORDER — FENTANYL CITRATE (PF) 100 MCG/2ML IJ SOLN: 100.0000 ug | INTRAMUSCULAR | Status: DC | PRN

## 2024-01-09 MED ORDER — PHENYLEPHRINE 80 MCG/ML (10ML) SYRINGE FOR IV PUSH (FOR BLOOD PRESSURE SUPPORT)
80.0000 ug | PREFILLED_SYRINGE | INTRAVENOUS | Status: DC | PRN
  Administered 2024-01-10: 80 ug via INTRAVENOUS
  Filled 2024-01-09: qty 10

## 2024-01-09 MED ORDER — OXYCODONE-ACETAMINOPHEN 5-325 MG PO TABS: 1.0000 | ORAL_TABLET | ORAL | Status: DC | PRN

## 2024-01-09 MED ORDER — LIDOCAINE HCL (PF) 1 % IJ SOLN
30.0000 mL | INTRAMUSCULAR | Status: DC | PRN
  Filled 2024-01-09: qty 30

## 2024-01-09 MED ORDER — ACETAMINOPHEN 325 MG PO TABS: 650.0000 mg | ORAL_TABLET | ORAL | Status: DC | PRN

## 2024-01-09 MED ORDER — OXYCODONE-ACETAMINOPHEN 5-325 MG PO TABS: 2.0000 | ORAL_TABLET | ORAL | Status: DC | PRN

## 2024-01-09 MED ORDER — LACTATED RINGERS IV SOLN: 500.0000 mL | Freq: Once | INTRAVENOUS | Status: DC

## 2024-01-09 MED ORDER — OXYTOCIN-SODIUM CHLORIDE 30-0.9 UT/500ML-% IV SOLN: 2.5000 [IU]/h | INTRAVENOUS | Status: DC

## 2024-01-09 MED ORDER — PHENYLEPHRINE 80 MCG/ML (10ML) SYRINGE FOR IV PUSH (FOR BLOOD PRESSURE SUPPORT): 80.0000 ug | PREFILLED_SYRINGE | INTRAVENOUS | Status: DC | PRN

## 2024-01-09 MED ORDER — LACTATED RINGERS IV SOLN: INTRAVENOUS | Status: DC

## 2024-01-09 MED ORDER — ONDANSETRON HCL 4 MG/2ML IJ SOLN: 4.0000 mg | Freq: Four times a day (QID) | INTRAMUSCULAR | Status: DC | PRN

## 2024-01-09 MED ORDER — SOD CITRATE-CITRIC ACID 500-334 MG/5ML PO SOLN: 30.0000 mL | ORAL | Status: DC | PRN

## 2024-01-09 MED ORDER — DIPHENHYDRAMINE HCL 50 MG/ML IJ SOLN: 12.5000 mg | INTRAMUSCULAR | Status: DC | PRN

## 2024-01-09 NOTE — MAU Note (Addendum)
.  Anita [name] is a 28 y.o. at [redacted]w[redacted]d here in MAU reporting: ctx since yesterday. Thought water broke this morning - had appointment at 1400 and was told her water was not broken. Was 2cm and had membrane sweep - ctx picked up since. Every 5-6 minutes now. Denies VB or LOF. +FM   Pain score: 8 Vitals:   01/09/24 1928  BP: (!) 148/72  Pulse: 75  Resp: 18  Temp: 97.9 F (36.6 C)  SpO2: 100%     FHT: 163  Lab orders placed from triage: labor eval; UA

## 2024-01-09 NOTE — H&P (Signed)
 Anita [name] is a 28 y.o. G1P0000 female at [redacted]w[redacted]d by LMP c/w 20wk u/s presenting with reg ctx.   Reports active fetal movement, contractions: regular, every 4 minutes, vaginal bleeding: none, membranes: intact.  Initiated prenatal care at [facility] at 8.5 wks, and transferred to [facility] at 28.2wks. Most recent u/s : [redacted]w[redacted]d, EFW 94% (7+13), cephalic, ant placenta, AFI 13cm.   This pregnancy complicated by: # PG BMI 40 # weak D Rh status # LGA (EFW 94% @ 36.5wks)  Prenatal History/Complications:  # primigravida  Past Medical History: Past Medical History:  Diagnosis Date   Chlamydia infection    Depression 01/17/2018   Lipoma 05/03/2021   Migraine without aura 01/31/2007   Qualifier: Diagnosis of   By: Deirdre Priest MD, Alesia Richards     Past Surgical History: Past Surgical History:  Procedure Laterality Date   APPENDECTOMY     BARTHOLIN CYST MARSUPIALIZATION N/A 12/31/2019   Procedure: BARTHOLIN CYST MARSUPIALIZATION;  Surgeon: Theresia Majors, MD;  Location: [facility];  Service: Gynecology;  Laterality: N/A;   Nexplanon      Inserted 09-14-17    Obstetrical History: OB History     Gravida  1   Para  0   Term  0   Preterm  0   AB  0   Living  0      SAB  0   IAB  0   Ectopic  0   Multiple  0   Live Births  0           Social History: Social History   Socioeconomic History   Marital status: Single    Spouse name: Not on file   Number of children: Not on file   Years of education: Not on file   Highest education level: Master's degree (e.g., MA, MS, MEng, MEd, MSW, MBA)  Occupational History   Not on file  Tobacco Use   Smoking status: Never    Passive exposure: Never   Smokeless tobacco: Never  Vaping Use   Vaping status: Never Used  Substance and Sexual Activity   Alcohol use: Not Currently    Comment: occasionally   Drug use: No   Sexual activity: Yes    Partners: Male    Comment: 1ST INTERCOURSE- 68, More  than 5 partners-Nexplanon inserted 09-14-17  Other Topics Concern   Not on file  Social History Narrative   Interested in being a physician would like to go to [employer]    Social Drivers of Health   Financial Resource Strain: Low Risk  (09/14/2023)   Overall Financial Resource Strain (CARDIA)    Difficulty of Paying Living Expenses: Not hard at all  Food Insecurity: No Food Insecurity (01/09/2024)   Hunger Vital Sign    Worried About Running Out of Food in the Last Year: Never true    Ran Out of Food in the Last Year: Never true  Transportation Needs: No Transportation Needs (01/09/2024)   PRAPARE - Administrator, Civil Service (Medical): No    Lack of Transportation (Non-Medical): No  Physical Activity: Insufficiently Active (09/14/2023)   Exercise Vital Sign    Days of Exercise per Week: 2 days    Minutes of Exercise per Session: 20 min  Stress: Stress Concern Present (09/14/2023)   [employer] of Occupational Health - Occupational Stress Questionnaire    Feeling of Stress : To some extent  Social  Connections: Moderately Integrated (09/14/2023)   Social Connection and Isolation Panel [NHANES]    Frequency of Communication with Friends and Family: More than three times a week    Frequency of Social Gatherings with Friends and Family: Twice a week    Attends Religious Services: More than 4 times per year    Active Member of [employer] or Organizations: No    Attends Engineer, structural: Not on file    Marital Status: Living with partner    Family History: Family History  Problem Relation Age of Onset   Asthma Mother    Healthy Father    Epilepsy Sister    Migraines Maternal Grandfather     Allergies: No Known Allergies  Medications Prior to Admission  Medication Sig Dispense Refill Last Dose/Taking   aspirin EC 81 MG tablet Take 1 tablet (81 mg total) by mouth daily. Swallow whole. 30 tablet 12 01/09/2024   Prenatal Vit-Fe Fumarate-FA (PRENATAL  VITAMINS PO) Take by mouth. OTC per pt   01/09/2024    Review of Systems  Pertinent pos/neg as indicated in HPI  Blood pressure (!) 122/56, pulse 79, temperature 97.9 F (36.6 C), temperature source Oral, resp. rate 18, height 5\' 10"  (1.778 m), weight (!) 140.8 kg, last menstrual period 04/08/2023, SpO2 99%. General appearance: cooperative and mild distress Lungs: clear to auscultation bilaterally Heart: regular rate and rhythm Abdomen: gravid, soft, non-tender, EFW by Leopold's approximately 8lbs Extremities: 1+ edema  Fetal monitoring: FHR: 130-140s bpm, variability: moderate,  Accelerations: Present,  decelerations:  Absent Uterine activity: Frequency: Every 4 minutes Dilation: 5.5 Effacement (%): 100 Station: -1 Exam by:: Philipp Deputy, CNM Presentation: cephalic   Prenatal labs: ABO, Rh: --/--/A NEG (02/26 2211) Antibody: NEG (02/26 2211) Rubella: 1.88 (07/26 1658) RPR: Non Reactive (12/31 0917)  HBsAg: Negative (07/26 1658)  HIV: Non Reactive (12/31 0917)  GBS: Negative/-- (02/05 1620)  2hr GTT: 79/149/122  Prenatal Transfer Tool  Maternal Diabetes: No Genetic Screening: Normal Maternal Ultrasounds/Referrals: Normal Fetal Ultrasounds or other Referrals:  None Maternal Substance Abuse:  No Significant Maternal Medications:  None Significant Maternal Lab Results: Rh negative  Results for orders placed or performed during the hospital encounter of 01/09/24 (from the past 24 hours)  Type and screen [facility]   Collection Time: 01/09/24 10:11 PM  Result Value Ref Range   ABO/RH(D) A NEG    Antibody Screen NEG    Sample Expiration      01/12/2024,2359 Performed at [facility], [address] [place], [place] [address]   Urinalysis, Routine w reflex microscopic -Urine, Clean Catch   Collection Time: 01/09/24 10:12 PM  Result Value Ref Range   Color, Urine YELLOW YELLOW   APPearance HAZY (A) CLEAR   Specific Gravity, Urine 1.029 1.005 -  1.030   pH 5.0 5.0 - 8.0   Glucose, UA NEGATIVE NEGATIVE mg/dL   Hgb urine dipstick MODERATE (A) NEGATIVE   Bilirubin Urine NEGATIVE NEGATIVE   Ketones, ur NEGATIVE NEGATIVE mg/dL   Protein, ur 30 (A) NEGATIVE mg/dL   Nitrite NEGATIVE NEGATIVE   Leukocytes,Ua TRACE (A) NEGATIVE   RBC / HPF 11-20 0 - 5 RBC/hpf   WBC, UA 11-20 0 - 5 WBC/hpf   Bacteria, UA RARE (A) NONE SEEN   Squamous Epithelial / HPF 0-5 0 - 5 /HPF   Mucus PRESENT   CBC   Collection Time: 01/09/24 10:12 PM  Result Value Ref Range   WBC 12.7 (H) 4.0 -  10.5 K/uL   RBC 4.41 3.87 - 5.11 MIL/uL   Hemoglobin 13.1 12.0 - 15.0 g/dL   HCT 16.1 09.6 - 04.5 %   MCV 85.0 80.0 - 100.0 fL   MCH 29.7 26.0 - 34.0 pg   MCHC 34.9 30.0 - 36.0 g/dL   RDW 40.9 81.1 - 91.4 %   Platelets 300 150 - 400 K/uL   nRBC 0.0 0.0 - 0.2 %     Assessment:  [redacted]w[redacted]d SIUP  G1P0000  Latent labor  Cat 1 FHR  GBS Negative/-- (02/05 1620)  Plan:  Admit to L&D  IV pain meds/epidural prn active labor  Epidural has been placed; requesting AROM for augmentation> clear fluid  Anticipate vag delivery   Plans to breastfeed  Contraception: unsure   Arabella Merles CNM 01/09/2024, 11:59 PM

## 2024-01-09 NOTE — Progress Notes (Signed)
   PRENATAL VISIT NOTE  Subjective:  Anita [name] is a 28 y.o. G1P0000 at [redacted]w[redacted]d being seen today for ongoing prenatal care.  She is currently monitored for the following issues for this low-risk pregnancy and has Supervision of normal first pregnancy, antepartum; WEAK D RH STATUS NEEDS RHOGAM; Maternal obesity, antepartum; BMI 40.0-44.9, adult (HCC); and LGA (large for gestational age) fetus affecting mother, antepartum on their problem list.  Patient reports no complaints.  Contractions: Regular. Vag. Bleeding: None.  Movement: Present. Denies leaking of fluid.   The following portions of the patient's history were reviewed and updated as appropriate: allergies, current medications, past family history, past medical history, past social history, past surgical history and problem list.   Objective:   Vitals:   01/09/24 1428  BP: 127/87  Pulse: 90  Weight: (!) 309 lb (140.2 kg)    Fetal Status: Fetal Heart Rate (bpm): NST   Movement: Present     General:  Alert, oriented and cooperative. Patient is in no acute distress.  Skin: Skin is warm and dry. No rash noted.   Cardiovascular: Normal heart rate noted  Respiratory: Normal respiratory effort, no problems with respiration noted  Abdomen: Soft, gravid, appropriate for gestational age.  Pain/Pressure: Present     Pelvic: Cervical exam performed in the presence of a chaperone no pooling, nitrazine negative Dilation: 2 Effacement (%): 70, 80 Station: -2  Extremities: Normal range of motion.     Mental Status: Normal mood and affect. Normal behavior. Normal judgment and thought content.  NST:  Baseline: 140 bpm, Variability: Good {> 6 bpm), Accelerations: Reactive, and Decelerations: Absent  Assessment and Plan:  Pregnancy: G1P0000 at [redacted]w[redacted]d 1. Supervision of normal first pregnancy, antepartum (Primary) Continue routine prenatal care. Membranes stripped Likely prodomal labor - no evidence of ROM  2. Excessive fetal growth affecting  pregnancy, antepartum, single or unspecified fetus Declined 39 week IOL  3. Rh negative status during pregnancy in third trimester S/p rhogam and give pp if indicated  4. Maternal obesity, antepartum In testing BPP ok today  5. [redacted] weeks gestation of pregnancy   Term labor symptoms and general obstetric precautions including but not limited to vaginal bleeding, contractions, leaking of fluid and fetal movement were reviewed in detail with the patient. Please refer to After Visit Summary for other counseling recommendations.   Return in 1 week (on 01/16/2024).  No future appointments.  Reva Bores, MD

## 2024-01-09 NOTE — Anesthesia Procedure Notes (Signed)
 Epidural Patient location during procedure: OB Start time: 01/09/2024 10:48 PM End time: 01/09/2024 10:53 PM  Staffing Anesthesiologist: Linton Rump, MD Performed: anesthesiologist   Preanesthetic Checklist Completed: patient identified, IV checked, site marked, risks and benefits discussed, surgical consent, monitors and equipment checked, pre-op evaluation and timeout performed  Epidural Patient position: sitting Prep: DuraPrep and site prepped and draped Patient monitoring: continuous pulse ox and blood pressure Approach: midline Location: L3-L4 Injection technique: LOR saline  Needle:  Needle type: Tuohy  Needle gauge: 17 G Needle length: 9 cm and 9 Needle insertion depth: 7 cm Catheter type: closed end flexible Catheter size: 19 Gauge Catheter at skin depth: 12 cm Test dose: negative  Assessment Events: blood not aspirated, no cerebrospinal fluid, injection not painful, no injection resistance, no paresthesia and negative IV test  Additional Notes The patient has requested an epidural for labor pain management. Risks and benefits including, but not limited to, infection, bleeding, local anesthetic toxicity, headache, hypotension, back pain, block failure, etc. were discussed with the patient. The patient expressed understanding and consented to the procedure. I confirmed that the patient has no bleeding disorders and is not taking blood thinners. I confirmed the patient's last platelet count with the nurse. A time-out was performed immediately prior to the procedure. Please see nursing documentation for vital signs. Sterile technique was used throughout the whole procedure. Once LOR achieved, the epidural catheter threaded easily without resistance. Aspiration of the catheter was negative for blood and CSF. The epidural was dosed slowly and an infusion was started.  1 attempt(s)Reason for block:procedure for pain

## 2024-01-09 NOTE — Anesthesia Preprocedure Evaluation (Signed)
 Anesthesia Evaluation  Patient identified by MRN, date of birth, ID band Patient awake    Reviewed: Allergy & Precautions, NPO status , Patient's Chart, lab work & pertinent test results  History of Anesthesia Complications Negative for: history of anesthetic complications  Airway Mallampati: III  TM Distance: >3 FB Neck ROM: Full    Dental   Pulmonary    Pulmonary exam normal breath sounds clear to auscultation       Cardiovascular  Rhythm:Regular Rate:Normal     Neuro/Psych  Headaches, neg Seizures PSYCHIATRIC DISORDERS  Depression       GI/Hepatic negative GI ROS, Neg liver ROS,,,  Endo/Other  neg diabetes  Class 3 obesity  Renal/GU negative Renal ROS     Musculoskeletal   Abdominal  (+) + obese  Peds  Hematology negative hematology ROS (+) Lab Results      Component                Value               Date                      WBC                      12.7 (H)            01/09/2024                HGB                      13.1                01/09/2024                HCT                      37.5                01/09/2024                MCV                      85.0                01/09/2024                PLT                      300                 01/09/2024              Anesthesia Other Findings LGA  Takes baby aspirin  Reproductive/Obstetrics (+) Pregnancy                             Anesthesia Physical Anesthesia Plan  ASA: 3  Anesthesia Plan: Epidural   Post-op Pain Management:    Induction:   PONV Risk Score and Plan:   Airway Management Planned: Natural Airway  Additional Equipment:   Intra-op Plan:   Post-operative Plan:   Informed Consent: I have reviewed the patients History and Physical, chart, labs and discussed the procedure including the risks, benefits and alternatives for the proposed anesthesia with the patient or authorized representative who has  indicated his/her understanding and acceptance.  Plan Discussed with: Anesthesiologist  Anesthesia Plan Comments: (I have discussed risks of neuraxial anesthesia including but not limited to infection, bleeding, nerve injury, back pain, headache, seizures, and failure of block. Patient denies bleeding disorders and is not currently anticoagulated. Labs have been reviewed. Risks and benefits discussed. All patient's questions answered.  )        Anesthesia Quick Evaluation

## 2024-01-09 NOTE — Progress Notes (Signed)

## 2024-01-09 NOTE — Telephone Encounter (Signed)
 Returned pt's phone call, pt states her water broke this AM and is asking when should she go to the hospital as she doesn't want to go to early. Advised pt she can see if her contractions pick up over the next few hours, she is GBS negative and so does not need to get PCN. Advised if at any point she has any bleeding or doesn't feel baby moving like normal to go right away. Advised to not wait to long if her contractions don't pick up as she is at an increased risk for infection. Pt verbalizes and understands.   Will make MAU aware pt will come in at some point.

## 2024-01-10 ENCOUNTER — Encounter: Payer: Self-pay | Admitting: Student

## 2024-01-10 ENCOUNTER — Encounter (HOSPITAL_COMMUNITY): Payer: Self-pay | Admitting: Obstetrics and Gynecology

## 2024-01-10 DIAGNOSIS — O139 Gestational [pregnancy-induced] hypertension without significant proteinuria, unspecified trimester: Secondary | ICD-10-CM

## 2024-01-10 DIAGNOSIS — O134 Gestational [pregnancy-induced] hypertension without significant proteinuria, complicating childbirth: Secondary | ICD-10-CM

## 2024-01-10 DIAGNOSIS — O3663X0 Maternal care for excessive fetal growth, third trimester, not applicable or unspecified: Secondary | ICD-10-CM

## 2024-01-10 DIAGNOSIS — O411230 Chorioamnionitis, third trimester, not applicable or unspecified: Secondary | ICD-10-CM

## 2024-01-10 DIAGNOSIS — Z3A39 39 weeks gestation of pregnancy: Secondary | ICD-10-CM

## 2024-01-10 HISTORY — DX: Gestational (pregnancy-induced) hypertension without significant proteinuria, unspecified trimester: O13.9

## 2024-01-10 LAB — COMPREHENSIVE METABOLIC PANEL
ALT: 23 U/L (ref 0–44)
AST: 29 U/L (ref 15–41)
Albumin: 2.5 g/dL — ABNORMAL LOW (ref 3.5–5.0)
Alkaline Phosphatase: 85 U/L (ref 38–126)
Anion gap: 10 (ref 5–15)
BUN: 9 mg/dL (ref 6–20)
CO2: 17 mmol/L — ABNORMAL LOW (ref 22–32)
Calcium: 8.6 mg/dL — ABNORMAL LOW (ref 8.9–10.3)
Chloride: 109 mmol/L (ref 98–111)
Creatinine, Ser: 1.21 mg/dL — ABNORMAL HIGH (ref 0.44–1.00)
GFR, Estimated: >60 mL/min (ref >60)
Glucose, Bld: 104 mg/dL — ABNORMAL HIGH (ref 70–99)
Potassium: 3.6 mmol/L (ref 3.5–5.1)
Sodium: 136 mmol/L (ref 135–145)
Total Bilirubin: 0.8 mg/dL (ref 0.0–1.2)
Total Protein: 5.6 g/dL — ABNORMAL LOW (ref 6.5–8.1)

## 2024-01-10 LAB — CBC
HCT: 35.2 % — ABNORMAL LOW (ref 36.0–46.0)
Hemoglobin: 12.1 g/dL (ref 12.0–15.0)
MCH: 29.4 pg (ref 26.0–34.0)
MCHC: 34.4 g/dL (ref 30.0–36.0)
MCV: 85.4 fL (ref 80.0–100.0)
Platelets: 264 10*3/uL (ref 150–400)
RBC: 4.12 MIL/uL (ref 3.87–5.11)
RDW: 13.9 % (ref 11.5–15.5)
WBC: 22.6 10*3/uL — ABNORMAL HIGH (ref 4.0–10.5)
nRBC: 0 % (ref 0.0–0.2)

## 2024-01-10 LAB — TYPE AND SCREEN
ABO/RH(D): A NEG
Antibody Screen: NEGATIVE

## 2024-01-10 LAB — RPR: RPR Ser Ql: NONREACTIVE

## 2024-01-10 MED ORDER — SIMETHICONE 80 MG PO CHEW: 80.0000 mg | CHEWABLE_TABLET | ORAL | Status: DC | PRN

## 2024-01-10 MED ORDER — MEDROXYPROGESTERONE ACETATE 150 MG/ML IM SUSP: 150.0000 mg | INTRAMUSCULAR | Status: DC | PRN

## 2024-01-10 MED ORDER — BENZOCAINE-MENTHOL 20-0.5 % EX AERO
1.0000 | INHALATION_SPRAY | CUTANEOUS | Status: DC | PRN
  Administered 2024-01-10: 1 via TOPICAL
  Filled 2024-01-10: qty 56

## 2024-01-10 MED ORDER — SENNOSIDES-DOCUSATE SODIUM 8.6-50 MG PO TABS
2.0000 | ORAL_TABLET | Freq: Every day | ORAL | Status: DC
  Administered 2024-01-11 – 2024-01-12 (×2): 2 via ORAL
  Filled 2024-01-10 (×2): qty 2

## 2024-01-10 MED ORDER — FUROSEMIDE 20 MG PO TABS
40.0000 mg | ORAL_TABLET | Freq: Every day | ORAL | Status: DC
  Administered 2024-01-11 – 2024-01-12 (×2): 40 mg via ORAL
  Filled 2024-01-10 (×2): qty 2

## 2024-01-10 MED ORDER — ACETAMINOPHEN 500 MG PO TABS
1000.0000 mg | ORAL_TABLET | Freq: Three times a day (TID) | ORAL | Status: DC
  Administered 2024-01-10 – 2024-01-12 (×6): 1000 mg via ORAL
  Filled 2024-01-10 (×7): qty 2

## 2024-01-10 MED ORDER — ONDANSETRON HCL 4 MG PO TABS: 4.0000 mg | ORAL_TABLET | ORAL | Status: DC | PRN

## 2024-01-10 MED ORDER — COCONUT OIL OIL: 1.0000 | TOPICAL_OIL | Status: DC | PRN

## 2024-01-10 MED ORDER — ZOLPIDEM TARTRATE 5 MG PO TABS: 5.0000 mg | ORAL_TABLET | Freq: Every evening | ORAL | Status: DC | PRN

## 2024-01-10 MED ORDER — POTASSIUM CHLORIDE CRYS ER 20 MEQ PO TBCR
20.0000 meq | EXTENDED_RELEASE_TABLET | Freq: Every day | ORAL | Status: DC
  Administered 2024-01-11 – 2024-01-12 (×2): 20 meq via ORAL
  Filled 2024-01-10 (×2): qty 1

## 2024-01-10 MED ORDER — PRENATAL MULTIVITAMIN CH
1.0000 | ORAL_TABLET | Freq: Every day | ORAL | Status: DC
  Administered 2024-01-11 – 2024-01-12 (×2): 1 via ORAL
  Filled 2024-01-10 (×2): qty 1

## 2024-01-10 MED ORDER — ONDANSETRON HCL 4 MG/2ML IJ SOLN
4.0000 mg | INTRAMUSCULAR | Status: DC | PRN
  Administered 2024-01-10: 4 mg via INTRAVENOUS
  Filled 2024-01-10: qty 2

## 2024-01-10 MED ORDER — IBUPROFEN 800 MG PO TABS
800.0000 mg | ORAL_TABLET | Freq: Three times a day (TID) | ORAL | Status: DC
  Administered 2024-01-10 – 2024-01-11 (×2): 800 mg via ORAL
  Filled 2024-01-10 (×2): qty 1

## 2024-01-10 MED ORDER — OXYTOCIN-SODIUM CHLORIDE 30-0.9 UT/500ML-% IV SOLN
1.0000 m[IU]/min | INTRAVENOUS | Status: DC
  Administered 2024-01-10: 1 m[IU]/min via INTRAVENOUS
  Filled 2024-01-10: qty 500

## 2024-01-10 MED ORDER — WITCH HAZEL-GLYCERIN EX PADS
1.0000 | MEDICATED_PAD | CUTANEOUS | Status: DC | PRN
  Administered 2024-01-10: 1 via TOPICAL

## 2024-01-10 MED ORDER — DIBUCAINE (PERIANAL) 1 % EX OINT: 1.0000 | TOPICAL_OINTMENT | CUTANEOUS | Status: DC | PRN

## 2024-01-10 MED ORDER — SODIUM CHLORIDE 0.9 % IV SOLN
3.0000 g | Freq: Once | INTRAVENOUS | Status: AC
  Administered 2024-01-10: 3 g via INTRAVENOUS
  Filled 2024-01-10 (×2): qty 8

## 2024-01-10 MED ORDER — OXYCODONE HCL 5 MG PO TABS: 5.0000 mg | ORAL_TABLET | Freq: Four times a day (QID) | ORAL | Status: DC | PRN

## 2024-01-10 MED ORDER — OXYCODONE HCL 5 MG PO TABS
10.0000 mg | ORAL_TABLET | Freq: Four times a day (QID) | ORAL | Status: DC | PRN
  Administered 2024-01-12: 10 mg via ORAL
  Filled 2024-01-10: qty 2

## 2024-01-10 MED ORDER — TERBUTALINE SULFATE 1 MG/ML IJ SOLN: 0.2500 mg | Freq: Once | INTRAMUSCULAR | Status: DC | PRN

## 2024-01-10 MED ORDER — DIPHENHYDRAMINE HCL 25 MG PO CAPS: 25.0000 mg | ORAL_CAPSULE | Freq: Four times a day (QID) | ORAL | Status: DC | PRN

## 2024-01-10 NOTE — Progress Notes (Signed)
 LABOR PROGRESS NOTE  Patient Name: Anita [name], female   DOB: 01-20-1996, 28 y.o.  MRN: [id number]  Patient called out. Ready for IUPC placement. Discussed R/B/A of IUPC placement, and verbal consent obtained. Placed without difficulty. Mom and babe tolerated well. Cat I. Discussed likely need for pitocin still. She will consider. Cervix unchanged from my exam this AM and exam from ~5 AM from previous provider. Cervix 7.5/90/-1.  Joanne Gavel, MD

## 2024-01-10 NOTE — Progress Notes (Signed)
 LABOR PROGRESS NOTE  Patient Name: Anita [name], female   DOB: 06-May-1996, 28 y.o.  MRN: [id number]  Patient amenable to check. Cervix 7-8/100/0. Offered pitocin as contractions have spaced; patient declines at this time. She will try position changes. Assisted patient into left-side lying with peanut ball. Cat II with rare shallow variables.  Joanne Gavel, MD

## 2024-01-10 NOTE — Lactation Note (Signed)
 This note was copied from a baby's chart. Lactation Consultation Note  Patient Name: Anita [name] [id number] Date: 01/10/2024 Age:27 hours  Mom awake but resting. Mom stated the baby is due to eat again at 0200. Asked mom to call for Lactation to come assist in latching. Mom stated OK.    Maternal Data    Feeding    LATCH Score                    Lactation Tools Discussed/Used    Interventions    Discharge    Consult Status      Anita [name] 01/10/2024, 11:50 PM

## 2024-01-10 NOTE — Progress Notes (Signed)
 Patient ID: Anita [name], female   DOB: [birth year], 28 y.o.   MRN: [id number]  CTSP for FHR decel x 5 mins to 90s @ 0445 with recovery after position changes; cx 7-8/90/vtx -1; avg LTV throughout; ctx difficult to trace; will continue monitoring and assessing for cervical change and progress; anticipate vag delivery.  Arabella Merles CNM 01/10/2024

## 2024-01-10 NOTE — Progress Notes (Signed)
 LABOR PROGRESS NOTE  Patient Name: Anita [name], female   DOB: [birth year], 28 y.o.  MRN: [id number]  In to meet patient and family. She declined cervical check at this time. She will call out when ready. Reassuring Cat II with accels and moderate variability.  Joanne Gavel, MD

## 2024-01-10 NOTE — Progress Notes (Signed)
 Brief progress note  Asked to come to bedside to speak w pt in regards to [facility] w/up being ordered. Explained rationale behind checking CBC, CMP. Also discussed rec for initation of Lasix and K. Pt accepting of lab work and Lasix.  Sundra Aland, MD OB Fellow, Faculty Practice [facility], Center for [facility]

## 2024-01-10 NOTE — Progress Notes (Signed)
 LABOR PROGRESS NOTE  Patient Name: Anita [name], female   DOB: 1996/08/17, 28 y.o.  MRN: [id number]  Bedside at RN request. Some decelerations noted on FHT. Suspect these are early/variables based on the contractions that are picking up on external toco. Moderate variability and accels otherwise. Discussed with patient recommendation for an IUPC to better trace contractions in relation to the decelerations and to be able to see strength of contractions. She would like to think about it at this time. Patient requested position change; performed.  Joanne Gavel, MD

## 2024-01-10 NOTE — Progress Notes (Signed)
 18- doula asks for complaint form to complain that DR Earlene Plater and I had turned the pt to her side when pt didn't want to. Pt never verbalized she wouldn't turn to side. Even after explaining over and over that we need to do try something different. Pt, doula nor family would listen to any recommendations. lee

## 2024-01-10 NOTE — Discharge Summary (Signed)
 Postpartum Discharge Summary  Date of Service updated - 01/12/24      Patient Name: Anita [name] DOB: 1996/03/15 MRN: [id number]  Date of admission: 01/09/2024 Delivery date:01/10/2024 Delivering provider: Joanne Gavel Date of discharge: 01/12/2024  Admitting diagnosis: Labor and delivery, indication for care [O75.9] Intrauterine pregnancy: [redacted]w[redacted]d     Secondary diagnosis:  Principal Problem:   NSVD (normal spontaneous vaginal delivery) Active Problems:   Supervision of normal first pregnancy, antepartum   WEAK D RH STATUS NEEDS RHOGAM   BMI 40.0-44.9, adult (HCC)   LGA (large for gestational age) fetus affecting mother, antepartum   Gestational hypertension  Additional problems: None    Discharge diagnosis: Term Pregnancy Delivered and Gestational Hypertension                                              Post partum procedures: None Augmentation: AROM and Pitocin Complications: None  Hospital course: Onset of Labor With Vaginal Delivery      28 y.o. yo G1P1001 at [redacted]w[redacted]d was admitted in Latent Labor on 01/09/2024. Labor course was complicated by none.  Membrane Rupture Time/Date: 11:55 PM,01/09/2024  Delivery Method:Vaginal, Spontaneous Operative Delivery: N/A Episiotomy: None Lacerations:  2nd degree;Perineal;Periurethral Patient had a postpartum course complicated by development of AKI likely 2/2 volume overload, she diuresced well and creatinine downtrended on day of discharge.  She is ambulating, tolerating a regular diet, passing flatus, and urinating well. Patient is discharged home in stable condition on 01/12/24.  Newborn Data: Birth date:01/10/2024 Birth time:6:16 PM Gender:Female Living status:Living Apgars:7 ,9  Weight:3710 g  Magnesium Sulfate received: No BMZ received: No Rhophylac:Yes MMR:No T-DaP:Given prenatally Flu: Yes RSV Vaccine received: No Transfusion:No  Immunizations received: Immunization History  Administered Date(s) Administered    H1N1 09/10/2008   Hepatitis A 10/23/2011   Hpv-Unspecified 06/25/2008, 08/28/2008   Influenza Split 10/23/2011, 12/16/2012   Influenza, Seasonal, Injecte, Preservative Fre 08/10/2023   Influenza,inj,Quad PF,6+ Mos 01/27/2015, 11/04/2015, 11/24/2016, 07/31/2018, 12/24/2019, 08/25/2020, 09/07/2021   Influenza-Unspecified 10/15/2022   Meningococcal Conjugate 01/27/2015   PPD Test 01/27/2015, 02/19/2018   Pfizer(Comirnaty)Fall Seasonal Vaccine 12 years and older 08/10/2023   Td 06/25/2008   Tdap 12/24/2019, 11/26/2023    Physical exam  Vitals:   01/11/24 1005 01/11/24 1500 01/11/24 2111 01/12/24 0534  BP: 132/78 112/66 108/73 120/67  Pulse: 75 71 74 74  Resp: 18 18 18 18   Temp: 98 F (36.7 C) 97.7 F (36.5 C) 98.2 F (36.8 C) 98 F (36.7 C)  TempSrc: Axillary Oral Oral Oral  SpO2: 96%  100% 99%  Weight:      Height:       General: alert, cooperative, and no distress Lochia: appropriate Uterine Fundus: firm Incision: N/A DVT Evaluation: No cords or calf tenderness. Calf/Ankle edema is present Labs: Lab Results  Component Value Date   WBC 22.6 (H) 01/10/2024   HGB 12.1 01/10/2024   HCT 35.2 (L) 01/10/2024   MCV 85.4 01/10/2024   PLT 264 01/10/2024      Latest Ref Rng & Units 01/12/2024    5:27 AM  CMP  Glucose 70 - 99 mg/dL 73   BUN 6 - 20 mg/dL 11   Creatinine 4.09 - 1.00 mg/dL 8.11   Sodium 914 - 782 mmol/L 139   Potassium 3.5 - 5.1 mmol/L 4.0   Chloride 98 - 111 mmol/L 109  CO2 22 - 32 mmol/L 19   Calcium 8.9 - 10.3 mg/dL 8.4   Total Protein 6.5 - 8.1 g/dL 4.9   Total Bilirubin 0.0 - 1.2 mg/dL 0.5   Alkaline Phos 38 - 126 U/L 62   AST 15 - 41 U/L 36   ALT 0 - 44 U/L 22    Edinburgh Score:    01/11/2024    9:27 PM  Edinburgh Postnatal Depression Scale Screening Tool  I have been able to laugh and see the funny side of things. --   No data recorded  After visit meds:  Allergies as of 01/12/2024   No Known Allergies      Medication List      STOP taking these medications    aspirin EC 81 MG tablet       TAKE these medications    furosemide 40 MG tablet Commonly known as: LASIX Take 1 tablet (40 mg total) by mouth daily.   ibuprofen 600 MG tablet Commonly known as: ADVIL Take 1 tablet (600 mg total) by mouth 4 (four) times daily.   potassium chloride SA 20 MEQ tablet Commonly known as: KLOR-CON M Take 1 tablet (20 mEq total) by mouth daily.   PRENATAL VITAMINS PO Take by mouth. OTC per pt   senna-docusate 8.6-50 MG tablet Commonly known as: Senokot-S Take 2 tablets by mouth daily.         Discharge home in stable condition Infant Feeding: Breast Infant Disposition:home with mother Discharge instruction: per After Visit Summary and Postpartum booklet. Activity: Advance as tolerated. Pelvic rest for 6 weeks.  Diet: routine diet Future Appointments: Future Appointments  Date Time Provider Department Center  01/17/2024  2:50 PM [facility] NURSE [facility]  02/21/2024  3:50 PM Bono Bing, MD [facility]   Follow up Visit:  Message sent to [facility] 2/27  Please schedule this patient for a In person postpartum visit in 6 weeks with the following provider: Any provider. Additional Postpartum F/U:BP check 1 week  Low risk pregnancy complicated by: HTN Delivery mode:  Vaginal, Spontaneous Anticipated Birth Control:  Unsure   01/12/2024 Sundra Aland, MD

## 2024-01-10 NOTE — Progress Notes (Signed)
 LABOR PROGRESS NOTE  Patient Name: Anita [name], female   DOB: 09/06/1996, 28 y.o.  MRN: [id number]  Patient feeling urge to push. Cervix 9.5/100/+1. Adjusted to sitting in high fowlers. Cat II for rare, shallow variables. Otherwise +accels, mod variability. Continue pit.  Joanne Gavel, MD

## 2024-01-11 LAB — KLEIHAUER-BETKE STAIN
# Vials RhIg: 1
Fetal Cells %: 0.1 %
Quantitation Fetal Hemoglobin: 0.0009 mL

## 2024-01-11 MED ORDER — IBUPROFEN 400 MG PO TABS
400.0000 mg | ORAL_TABLET | Freq: Three times a day (TID) | ORAL | Status: DC
  Administered 2024-01-11: 400 mg via ORAL
  Filled 2024-01-11 (×3): qty 1

## 2024-01-11 MED ORDER — RHO D IMMUNE GLOBULIN 1500 UNIT/2ML IJ SOSY
300.0000 ug | PREFILLED_SYRINGE | Freq: Once | INTRAMUSCULAR | Status: AC
  Administered 2024-01-11: 300 ug via INTRAVENOUS
  Filled 2024-01-11: qty 2

## 2024-01-11 MED ORDER — IBUPROFEN 800 MG PO TABS
400.0000 mg | ORAL_TABLET | Freq: Three times a day (TID) | ORAL | Status: DC
Start: 2024-01-11 — End: 2024-01-11

## 2024-01-11 MED ORDER — IBUPROFEN 400 MG PO TABS
400.0000 mg | ORAL_TABLET | Freq: Three times a day (TID) | ORAL | Status: DC
  Administered 2024-01-11 – 2024-01-12 (×2): 400 mg via ORAL
  Filled 2024-01-11 (×5): qty 1

## 2024-01-11 NOTE — Lactation Note (Signed)
 This note was copied from a baby's chart. Lactation Consultation Note  Patient Name: Anita [name] [id number] Date: 01/11/2024 Age:27 hours Reason for consult: Follow-up assessment;1st time breastfeeding;Primapara;Term;Breastfeeding assistance;RN request   11:15 - 1209 P1 mom of 17 hour old infant consulted. Infant rooting in the bassinet, fussy. Mom reports infant last had successful breastfeed overnight but expressed concerned for "infant shaking". RN presented to room and discussed monitoring when the shaking occurs with mom and to work on feeding with lactation to see if it subsides. LC assisted with placing baby to the left breast. Infant was able to latch but held breast tissue only. Repeated attempts, offered minimal sucking. Removed swaddle and repositioned to breast. Infant remained reluctant to offer sucks. LC assisted with hand expression. Colostrum easily expresses from left breast. Fed infant 3ml via spoon feeding prior to trying the breast once more. Infant remained sleepy.   Discussed pumping with mom. Mom verbalized a desired to establish infant's latch prior to offer her a bottle. LC reviewed the reasons for pumping: if infant will initiate blood sugar checks, if infant continues to be reluctant with latching, if latching has been difficult after the next two feedings. Explained to mom that pumping will also allow for her milk production to be protected. Mom desired to wait for this session, but is likely to decide to pump after next feeding attempt in a few hours or by this evening. Encouraged mom to call out for assistance as needed.   Maternal Data    Feeding Mother's Current Feeding Choice: Breast Milk  LATCH Score Latch: Repeated attempts needed to sustain latch, nipple held in mouth throughout feeding, stimulation needed to elicit sucking reflex.  Audible Swallowing: A few with stimulation  Type of Nipple: Flat  Comfort (Breast/Nipple): Soft / non-tender  Hold  (Positioning): Assistance needed to correctly position infant at breast and maintain latch.  LATCH Score: 6   Lactation Tools Discussed/Used    Interventions Interventions: Breast feeding basics reviewed;Assisted with latch;Skin to skin;Hand express;Support pillows;Position options;Expressed milk;Education  Discharge    Consult Status Consult Status: Follow-up Date: 01/12/24 Follow-up type: In-patient    [name] 01/11/2024, 12:15 PM

## 2024-01-11 NOTE — Progress Notes (Signed)
 POSTPARTUM PROGRESS NOTE  Post Partum Day 1  Subjective:  Anita [name] is a 28 y.o. G1P1001 s/p SVD at [redacted]w[redacted]d.  She reports she is doing well. No acute events overnight. She denies any problems with ambulating, voiding or po intake. Denies nausea or vomiting.  Pain is well controlled.  Lochia is minimal.  Objective: Blood pressure 120/63, pulse 60, temperature (!) 97.4 F (36.3 C), temperature source Axillary, resp. rate 18, height 5\' 10"  (1.778 m), weight (!) 140.8 kg, last menstrual period 04/08/2023, SpO2 100%, unknown if currently breastfeeding.  Physical Exam:  General: alert, cooperative and no distress Chest: no respiratory distress Heart:regular rate, distal pulses intact Uterine Fundus: firm, appropriately tender DVT Evaluation: No calf swelling or tenderness Extremities: 1+ bilateral lower extremity edema Skin: warm, dry  Recent Labs    01/09/24 2212 01/10/24 2111  HGB 13.1 12.1  HCT 37.5 35.2*    Assessment/Plan: Anita [name] is a 28 y.o. G1P1001 s/p SVD at [redacted]w[redacted]d   PPD#1 - Doing well  Routine postpartum care  Gestational HTN  elevated Cr - Cr 1.21, up from 0.69, suspect volume overload  start Lasix and K  Repeat CMP in AM  Contraception: Unsure Feeding: Breast Dispo: Plan for discharge tomorrow.   LOS: 2 days   Sundra Aland, MD OB Fellow  01/11/2024, 8:46 AM

## 2024-01-11 NOTE — Lactation Note (Signed)
 This note was copied from a baby's chart. Lactation Consultation Note  Patient Name: Girl Addilyn Satterwhite [id number] Date: 01/11/2024 Age:27 hours Reason for consult: Initial assessment;Primapara;Term Mom called for feeding assistance. Mom stated baby is wanting to stay at the breast a lot but will only suckle once in a while. Mainly wants to sleep at the breast and just one or two sucks here and there.  Newborn feeding habits, behavior, STS, I&O,positioning, support reviewed. Mom is exhausted and needs to rest. Baby is awake not wanting to lay down after feeding. Suggested FOB holds baby while mom gets some rest.  When LC assisted baby at the breast she would only suck in a non-nutritive feeding pattern as a pacifier. LC would stimulate baby to get her to suckle stronger and she would occasionally. No swallows heard.  Mom encouraged to feed baby 8-12 times/24 hours and with feeding cues.  LC done education during the feeding but mom is very tired. I don't think she can retain much information at this time. Encouraged to call for [facility] assistance if needed.  Maternal Data Has patient been taught Hand Expression?: Yes Does the patient have breastfeeding experience prior to this delivery?: No  Feeding    LATCH Score Latch: Repeated attempts needed to sustain latch, nipple held in mouth throughout feeding, stimulation needed to elicit sucking reflex.  Audible Swallowing: None  Type of Nipple: Everted at rest and after stimulation (everts w/stimulation)  Comfort (Breast/Nipple): Soft / non-tender  Hold (Positioning): Full assist, staff holds infant at breast  LATCH Score: 5   Lactation Tools Discussed/Used    Interventions Interventions: Breast feeding basics reviewed;Assisted with latch;Skin to skin;Breast massage;Hand express;Breast compression;Adjust position;Support pillows;Position options;Education;LC Services brochure  Discharge    Consult Status Consult Status: Follow-up Date:  01/11/24 Follow-up type: In-patient    Kupono Marling, Diamond Nickel 01/11/2024, 1:09 AM

## 2024-01-11 NOTE — Lactation Note (Signed)
 This note was copied from a baby's chart. Lactation Consultation Note  Patient Name: Anita [name] [id number] Date: 01/11/2024 Age:27 hours Reason for consult: Follow-up assessment;Mother's request;1st time breastfeeding;Primapara;Term;Breastfeeding assistance  1405-1506 P1 mom called out for lactation assistance. LC returned to room and mom reports infant "Anita [name]" was showing readiness to feed. LC assisted with handing infant to mom. Mom attempted to place on right breast, infant only held the breast tissue. Did not elicit any sucking. After repeated attempts, LC suggested supplementing with MBM. Spoon fed 4ml to baby of mom's expressed milk. Discussed feeding options as in previous visit. Suggested that infant may feed more effectively this evening but if she does not begin to maintain suck-swallow-breathe, mom would benefit from pumping, or if infant has to have interventions. Discussed option of manual pump as well. Mom is still hesitant to pump but receptive of the reasoning. Desires to continue to work on breastfeeding and use of hand expression. LC encouraged mom that the biggest battle right now is Anita [name]'s sleepiness which is normal within the first 24 hours. Suggested skin to skin prior to breastfeeding and to offer the breast to baby by hour 2 due to inconsistency with breastfeeds. Recommended that parents aim to have lactation return for support this evening to bridge the gap and further support with latching.   LC educated on the use of a nipple shield as a feeding aid, but LC does not believe it is needed at this time due to inability to latch deeply. The issue is primarily with sleepiness and lack of consistent sucking. Discussed that if a nipple shield is used, DEBP would need to be set up and used after feeding. Parents receptive.   Lactation to pass along need for support to next shift.  Maternal Data Has patient been taught Hand Expression?: Yes  Feeding Mother's Current  Feeding Choice: Breast Milk  LATCH Score Latch: Repeated attempts needed to sustain latch, nipple held in mouth throughout feeding, stimulation needed to elicit sucking reflex.  Audible Swallowing: A few with stimulation  Type of Nipple: Flat  Comfort (Breast/Nipple): Soft / non-tender  Hold (Positioning): Assistance needed to correctly position infant at breast and maintain latch.  LATCH Score: 6   Lactation Tools Discussed/Used    Interventions Interventions: Breast feeding basics reviewed;Assisted with latch;Breast massage;Hand express;Breast compression;Adjust position;Expressed milk;Education  Discharge    Consult Status Consult Status: Follow-up Date: 01/12/24 Follow-up type: In-patient    [name] 01/11/2024, 3:09 PM

## 2024-01-11 NOTE — Progress Notes (Signed)
 MOB was referred for history of depression/anxiety.  * Referral screened out by Clinical Social Worker because none of the following criteria appear to apply:  ~ History of anxiety/depression during this pregnancy, or of post-partum depression following prior delivery.  ~ Diagnosis of anxiety and/or depression within last 3 years  Per OB notes, MOB did not indicate any signs/symptoms during her pregnancy.  Dep/anx 2019  OR  * MOB's symptoms currently being treated with medication and/or therapy.  Please contact the Clinical Social Worker if needs arise, by [facility] request, or if MOB scores greater than 9/yes to question 10 on Edinburgh Postpartum Depression Screen.  Enos Fling, Theresia Majors Clinical Social Worker [phone]

## 2024-01-11 NOTE — Lactation Note (Signed)
 This note was copied from a baby's chart. Lactation Consultation Note  Patient Name: Anita [name] Date: 01/11/2024 Age:27 hours Reason for consult: Mother's request;Term;1st time breastfeeding (weight loss -4.87%) Per MOB, infant recently BF for 20 minutes at 1657, infant was still cuing when [facility] entered the room. MOB re-latched infant on her left breast using football hold and pillow support. LC discussed do reverse pressure softening or hand expression prior to latch to help evert nipple shaft out. MOB did breast stimulation, such as; talking to infant, breast compression and gently stroking infant's shoulder and neck. MOB knows not to let infant sleep at the breast if not actively feeding. Afterwards, MOB hand express and infant given 4 mls of colostrum by spoon. MOB feels infant is now more alert and starting to latch at the breast. LC discussed that infant may start cluster feeding after Day 1 of life and that this is normal behavior for infant.   Today's Current feeding plan: 1- MOB will hand express or do reverse pressure softening prior to latching infant at the breast, will continue to BF infant by cues on demand, every 2-3 hours, skin to skin. 2- MOB knows to call for further latch assistance if needed. 3- MOB knows after latching infant at the breast to hand express and give infant extra volume of colostrum by spoon as MOB continue to work on latching infant at the breast.  Maternal Data Has patient been taught Hand Expression?: Yes  Feeding Mother's Current Feeding Choice: Breast Milk  LATCH Score Latch: Grasps breast easily, tongue down, lips flanged, rhythmical sucking.  Audible Swallowing: A few with stimulation  Type of Nipple: Everted at rest and after stimulation (short shafted did reverse pressure softening prior to latching infant at the breast.)  Comfort (Breast/Nipple): Soft / non-tender  Hold (Positioning): Assistance needed to correctly position infant  at breast and maintain latch.  LATCH Score: 8   Lactation Tools Discussed/Used    Interventions Interventions: Assisted with latch;Skin to skin;Hand express;Breast compression;Adjust position;Support pillows;Position options;Expressed milk;Education  Discharge    Consult Status Consult Status: Follow-up Date: 01/12/24 Follow-up type: In-patient    Frederico Hamman 01/11/2024, 5:41 PM

## 2024-01-11 NOTE — Anesthesia Postprocedure Evaluation (Signed)
 Anesthesia Post Note  Patient: Anita [name]  Procedure(s) Performed: AN AD HOC LABOR EPIDURAL     Patient location during evaluation: Mother Baby Anesthesia Type: Epidural Level of consciousness: awake and alert and oriented Pain management: satisfactory to patient Vital Signs Assessment: post-procedure vital signs reviewed and stable Respiratory status: respiratory function stable Cardiovascular status: stable Postop Assessment: no headache, no backache, epidural receding, patient able to bend at knees, no signs of nausea or vomiting, adequate PO intake and able to ambulate Anesthetic complications: no   No notable events documented.  Last Vitals:  Vitals:   01/11/24 0215 01/11/24 0510  BP: 123/70 120/63  Pulse: 80 60  Resp: 16 18  Temp: 37.2 C (!) 36.3 C  SpO2: 100% 100%    Last Pain:  Vitals:   01/11/24 0750  TempSrc:   PainSc: 0-No pain   Pain Goal:                   Jarnell Cordaro

## 2024-01-12 ENCOUNTER — Other Ambulatory Visit (HOSPITAL_COMMUNITY): Payer: Self-pay

## 2024-01-12 ENCOUNTER — Ambulatory Visit (HOSPITAL_COMMUNITY): Payer: Self-pay

## 2024-01-12 LAB — COMPREHENSIVE METABOLIC PANEL
ALT: 22 U/L (ref 0–44)
AST: 36 U/L (ref 15–41)
Albumin: 2.3 g/dL — ABNORMAL LOW (ref 3.5–5.0)
Alkaline Phosphatase: 62 U/L (ref 38–126)
Anion gap: 11 (ref 5–15)
BUN: 11 mg/dL (ref 6–20)
CO2: 19 mmol/L — ABNORMAL LOW (ref 22–32)
Calcium: 8.4 mg/dL — ABNORMAL LOW (ref 8.9–10.3)
Chloride: 109 mmol/L (ref 98–111)
Creatinine, Ser: 0.89 mg/dL (ref 0.44–1.00)
GFR, Estimated: >60 mL/min (ref >60)
Glucose, Bld: 73 mg/dL (ref 70–99)
Potassium: 4 mmol/L (ref 3.5–5.1)
Sodium: 139 mmol/L (ref 135–145)
Total Bilirubin: 0.5 mg/dL (ref 0.0–1.2)
Total Protein: 4.9 g/dL — ABNORMAL LOW (ref 6.5–8.1)

## 2024-01-12 LAB — RH IG WORKUP (INCLUDES ABO/RH)
Gestational Age(Wks): 39.4
Unit division: 0

## 2024-01-12 MED ORDER — FUROSEMIDE 40 MG PO TABS
40.0000 mg | ORAL_TABLET | Freq: Every day | ORAL | 0 refills | Status: DC
  Filled 2024-01-12: qty 6, 6d supply, fill #0

## 2024-01-12 MED ORDER — POTASSIUM CHLORIDE CRYS ER 20 MEQ PO TBCR
20.0000 meq | EXTENDED_RELEASE_TABLET | Freq: Every day | ORAL | 0 refills | Status: DC
  Filled 2024-01-12: qty 6, 6d supply, fill #0

## 2024-01-12 MED ORDER — IBUPROFEN 600 MG PO TABS
600.0000 mg | ORAL_TABLET | Freq: Four times a day (QID) | ORAL | 0 refills | Status: DC
  Filled 2024-01-12: qty 40, 10d supply, fill #0

## 2024-01-12 MED ORDER — SENNOSIDES-DOCUSATE SODIUM 8.6-50 MG PO TABS
2.0000 | ORAL_TABLET | Freq: Every day | ORAL | 0 refills | Status: DC
  Filled 2024-01-12: qty 60, 30d supply, fill #0

## 2024-01-12 NOTE — Lactation Note (Signed)
 This note was copied from a baby's chart. Lactation Consultation Note  Patient Name: Anita [name] [id number] Date: 01/12/2024 Reason for consult: Follow-up assessment;RN request;Maternal discharge  P1, 39 wks, @ 38 hrs of life. Infant irritable @ breast. Hand expression demonstrates great running colostrum. Demonstrated starting with hand expression, and using breast compression through feed to get baby to calm down. Several free drops expressed to baby. Demonstrated expressing onto spoon and feeding off spoon. Discussed base layer of food to calm baby to start- mom afraid pacifier use confused baby- discussed considerations- delaying for first weeks vs working on big mouth- worse for short shaft nipple/ edema breast moms. Few suckle clusters achieved on each breast, Finished feed with over half a spoonful fed to baby- baby asleep. Shells discussed/ provided, addressed questions on hand pump use.  Encouraged use EBM or coconut oil after each feed. Discussed cluster feeding overnight/ early morning brings in our milk supply, shared expectations of milk coming in. Highlighted risk of engorgement. Discussed hand pump/express to soften breasts, motrin as anti-inflammatory, and ice packs for 10-20 minutes post feed/pumping if still over-full is the best treatments for inflamed/engorged breasts. Highlighted hand pump best @ moving milk from engorged breasts.  Maternal Data Has patient been taught Hand Expression?: Yes Does the patient have breastfeeding experience prior to this delivery?: No  Feeding Mother's Current Feeding Choice: Breast Milk  LATCH Score Latch: Repeated attempts needed to sustain latch, nipple held in mouth throughout feeding, stimulation needed to elicit sucking reflex.  Audible Swallowing: A few with stimulation  Type of Nipple: Flat  Comfort (Breast/Nipple): Soft / non-tender  Hold (Positioning): Full assist, staff holds infant at breast  LATCH Score: 5   Lactation  Tools Discussed/Used Tools: Shells (Discussed reverse pressure, edema to nipple, and wearing shells between feeds)  Interventions Interventions: Breast feeding basics reviewed;Assisted with latch;Hand express;Breast compression;Expressed milk;Hand pump;Education  Discharge Discharge Education: Engorgement and breast care Pump: Manual;Personal;DEBP (Per mom has multiple types of pumps)  Consult Status Consult Status: Follow-up Date: 01/12/24 Follow-up type: In-patient    [facility] 01/12/2024, 9:58 AM

## 2024-01-12 NOTE — Lactation Note (Signed)
 This note was copied from a baby's chart. Lactation Consultation Note  Patient Name: Anita [name] [id number] Date: 01/12/2024 Age 27 hours Reason for consult: Follow-up assessment;Difficult latch;1st time breastfeeding;Primapara;Term  P1, 37 wks, @ 40 hrs. Peds MD/ Mom request assist. Infant irritable @ breast. Discussed setting up DEBP- mom receptive. Cycle completed with mom- great colostrum collected- 8 ml, fed to baby by syringe while working on moms breast. Infant feeds well on right breast with EBM- 10 minutes-, switched breast- infant irritable again- hand expressed another 2 ml and fed off spoon- then able to latch baby to left breast for another 10 minutes. Mom shares nipple soreness, demonstrated breaking latch and re-latching deep- mom verbalizes improvement but still uncomfortable. Discussed nipple inspection/care post feed. Discussed discomfort with good latching and higher weight loss percentages may be related to an underlying cause- like a tongue tie. Discussed referral to OP LC- mom receptive. Handouts on tongue-tie resources and the TABBY tongue assessment tool shared with mom. Encouraged trying baby @ breast first, pumping after- if too uncomfortable or baby too irritable- pump and feed before breast. Hydrogels discussed/ applied to moms breasts. Mom questions cluster feeding meant baby wasn't getting enough- re-enforced- cluster feeding brings in our milk supply and is meant to happen overnights in the first days of life.   Maternal Data Has patient been taught Hand Expression?: Yes Does the patient have breastfeeding experience prior to this delivery?: No  Feeding Mother's Current Feeding Choice: Breast Milk  LATCH Score Latch: Grasps breast easily, tongue down, lips flanged, rhythmical sucking.  Audible Swallowing: Spontaneous and intermittent  Type of Nipple: Flat  Comfort (Breast/Nipple): Filling, red/small blisters or bruises, mild/mod discomfort  Hold  (Positioning): Assistance needed to correctly position infant at breast and maintain latch.  LATCH Score: 7   Lactation Tools Discussed/Used Tools: Comfort gels  Interventions Interventions: Breast feeding basics reviewed;Assisted with latch;Hand express;Pre-pump if needed;Breast compression;Adjust position;Expressed milk;Coconut oil;Comfort gels;DEBP;Education  Discharge Pump: DEBP;Personal  Consult Status Consult Status: Follow-up Date: 01/12/24 Follow-up type: In-patient    [facility] 01/12/2024, 12:15 PM

## 2024-01-12 NOTE — Discharge Instructions (Signed)
 WHAT TO LOOK OUT FOR: Fever of 100.4 or above Mastitis: feels like flu and breasts hurt Infection: increased pain, swelling or redness Blood clots golf ball size or larger Postpartum depression   Congratulations on your newest addition!

## 2024-01-12 NOTE — Lactation Note (Addendum)
 This note was copied from a baby's chart. Lactation Consultation Note  Patient Name: Anita [name] [id number] Date: 01/12/2024 Age:27 hours Reason for consult: Mother's request;1st time breastfeeding;Primapara;Difficult latch;Term  P1, 39 wks, @ 48 hrs of life. Mom cluster fed and used donor milk since LC last worked with her. Praised mom , baby more content- settles onto breast and works with encouragement. After feed, baby pops off content- asleep next to nipple. Highlighted this is the prefect content ending to a feeding. Much improvement with this feeding, praised mom. Highlighted benefits of donor milk use for baby.   Maternal Data Has patient been taught Hand Expression?: Yes Does the patient have breastfeeding experience prior to this delivery?: No  Feeding Mother's Current Feeding Choice: Breast Milk and Donor Milk  LATCH Score Latch: Grasps breast easily, tongue down, lips flanged, rhythmical sucking.  Audible Swallowing: Spontaneous and intermittent  Type of Nipple: Everted at rest and after stimulation  Comfort (Breast/Nipple): Soft / non-tender  Hold (Positioning): Assistance needed to correctly position infant at breast and maintain latch.  LATCH Score: 9   Lactation Tools Discussed/Used Tools: Comfort gels  Interventions Interventions: Breast feeding basics reviewed;Assisted with latch;Hand express;Pre-pump if needed;Breast compression;Expressed milk;Comfort gels;DEBP;Education  Discharge    Consult Status Consult Status: Follow-up Date: 01/13/24 Follow-up type: In-patient    [facility] 01/12/2024, 6:26 PM

## 2024-01-13 ENCOUNTER — Ambulatory Visit (HOSPITAL_COMMUNITY): Payer: Self-pay

## 2024-01-13 NOTE — Lactation Note (Addendum)
 This note was copied from a baby's chart. Lactation Consultation Note  Patient Name: Anita [name] Date: 01/13/2024 Age:27 hours Reason for consult: Follow-up assessment;Maternal discharge;1st time breastfeeding;Primapara  P1, 39 wks, @ 66 hrs of life. Per mom- things are coming together- she feels her milk coming in and was able to pump 25 ml! Discussed how goals of pumping change once in maintain vs initiation of milk. Addressed questions of feeding and pumping frequency once going home. Mom brings infant onto breast in [facility] presence without assist- beautifully. Highlighted amazing progress. Discussed mom returning to work, the benefits of baby coming to breast vs exclusively pumping. Re-enforced engorgement teaching previously covered with mom.  Maternal Data Has patient been taught Hand Expression?: Yes Does the patient have breastfeeding experience prior to this delivery?: No  Feeding Mother's Current Feeding Choice: Breast Milk and Donor Milk  LATCH Score Latch: Grasps breast easily, tongue down, lips flanged, rhythmical sucking.  Audible Swallowing: Spontaneous and intermittent  Type of Nipple: Everted at rest and after stimulation  Comfort (Breast/Nipple): Soft / non-tender  Hold (Positioning): No assistance needed to correctly position infant at breast.  LATCH Score: 10   Lactation Tools Discussed/Used    Interventions Interventions: Breast feeding basics reviewed;Assisted with latch;Hand express;Breast compression;Expressed milk;Coconut oil;Hand pump;DEBP;Education  Discharge Discharge Education: Engorgement and breast care Pump: DEBP;Manual;Hands Free;Personal  Consult Status Consult Status: Complete Date: 01/13/24    Anita [name] 01/13/2024, 12:41 PM

## 2024-01-17 ENCOUNTER — Encounter: Payer: Self-pay | Admitting: Family Medicine

## 2024-01-17 ENCOUNTER — Ambulatory Visit: Payer: Managed Care, Other (non HMO)

## 2024-01-17 NOTE — Progress Notes (Signed)
 MOOD  VISIT ENCOUNTER NOTE  Subjective:   Anita [name] is a 28 y.o. G62P1001 female here for mood changes postpartum.  Delivered on 01/10/24, term infant LGA (BW= 3710g). AKI during hospitalization 2/2 to suspected fluid overload  Current complaints: anxiety  Lurae has a history of anxiety prior to pregnancy and self described as a "worrier" but feels it is 10x worse. Prior to pregnancy, she had taken sertaline for a couple months after the death of a close friend. She reports no SE from medication but did come off.  Has done counseling in the past with limited efficacy. She would feel better during counseling and then would not between sessions.    Currently she reports having many preservative thoughts and worries. She realizes they are not always rational but has trouble stopping them. She notes a lack of bonding with infant. She feels she is going through the motions and "she knows how to care for babies but I feel like a babysitter." She reports disrupted sleep with infant waking every 2 hours for feeding.     She has plans to start counseling and outpatient LC care  Therapist= [facility]-- next week 3/14 Appt with LC 3/10   Seen 3/2 by [facility] in hospital-- LATCH 10 and was starting to make milk. Was using donor milk in hospital. Pump volumes inpatient about 25mL. She is currently exclusively pumping. In the hospital infant "tore up nipples" and she had cracks and bleeding. She reports she decided to exclusively pump-- getting combine volume of 6oz. Pumping every 2-3 hours. Notes infant is waking at 2 hr and needing to eat more frequently. She is most comfortable with pumping at this point and expressed some guilt about not latching.   Gynecologic History Patient's last menstrual period was 04/08/2023 (exact date).  Contraception: none  Health Maintenance Due  Topic Date Due   HPV VACCINES (3 - 2-dose series) 12/26/2008    The following portions of the patient's  history were reviewed and updated as appropriate: allergies, current medications, past family history, past medical history, past social history, past surgical history and problem list.  Review of Systems Pertinent items are noted in HPI.   Objective:  BP 124/84   Pulse 69   Wt 286 lb 12.8 oz (130.1 kg)   LMP 04/08/2023 (Exact Date)   Breastfeeding Yes   BMI 41.15 kg/m  Gen: well appearing, NAD HEENT: no scleral icterus CV: RR Lung: Normal WOB Ext: warm well perfused   Edinburgh Postnatal Depression Scale - 01/18/24 0835       Edinburgh Postnatal Depression Scale:  In the Past 7 Days   I have been able to laugh and see the funny side of things. 1    I have looked forward with enjoyment to things. 1    I have blamed myself unnecessarily when things went wrong. 3    I have been anxious or worried for no good reason. 3    I have felt scared or panicky for no good reason. 3    Things have been getting on top of me. 0    I have been so unhappy that I have had difficulty sleeping. 0    I have felt sad or miserable. 1    I have been so unhappy that I have been crying. 1    The thought of harming myself has occurred to me. 0    Edinburgh Postnatal Depression Scale Total 13  Assessment and Plan:   1. Postpartum anxiety (Primary) Reviewed r/b of treatment  Discussed how medications work and SE profile Provided support for difficulty of this time period Patient has appropriate follow up with therapy and LC scheduled Reviewed expectation for fullest effect of medication at 3-6 weeks Provided support for her lactation journey and there are no "shoulds" and if pumping is working then it is working.  - sertraline (ZOLOFT) 25 MG tablet; Take 1 tablet (25 mg total) by mouth daily. If in 1 week you are not having severe nausea vomiting, increase to 2 tablets (50mg ) until seen  Dispense: 45 tablet; Refill: 1 - hydrOXYzine (ATARAX) 25 MG tablet; Take 1 tablet (25 mg  total) by mouth every 6 (six) hours as needed for anxiety.  Dispense: 30 tablet; Refill: 1  Please refer to After Visit Summary for other counseling recommendations.   Return in about 4 weeks (around 02/15/2024) for Mood Check.  Future Appointments  Date Time Provider Department Center  02/08/2024  8:15 AM Federico Flake, MD [facility]    Federico Flake, MD, [facility] Center for [facility]

## 2024-01-18 ENCOUNTER — Ambulatory Visit: Admitting: Family Medicine

## 2024-01-18 VITALS — BP 124/84 | HR 69 | Wt 286.8 lb

## 2024-01-18 DIAGNOSIS — O99345 Other mental disorders complicating the puerperium: Secondary | ICD-10-CM

## 2024-01-18 DIAGNOSIS — F418 Other specified anxiety disorders: Secondary | ICD-10-CM

## 2024-01-18 MED ORDER — SERTRALINE HCL 25 MG PO TABS
25.0000 mg | ORAL_TABLET | Freq: Every day | ORAL | 1 refills | Status: DC
Start: 2024-01-18 — End: 2024-02-08

## 2024-01-18 MED ORDER — HYDROXYZINE HCL 25 MG PO TABS: 25.0000 mg | ORAL_TABLET | Freq: Four times a day (QID) | ORAL | 1 refills | Status: DC | PRN

## 2024-01-18 NOTE — Progress Notes (Signed)
 Postpartum: 1 week out from delivery   Does notice some pp anxiety and depression ?   Breast feeding Latching problem, went to pumping

## 2024-02-05 ENCOUNTER — Encounter: Payer: Self-pay | Admitting: Family Medicine

## 2024-02-08 ENCOUNTER — Ambulatory Visit: Admitting: Family Medicine

## 2024-02-08 ENCOUNTER — Encounter: Payer: Self-pay | Admitting: Family Medicine

## 2024-02-08 DIAGNOSIS — F418 Other specified anxiety disorders: Secondary | ICD-10-CM

## 2024-02-08 DIAGNOSIS — O99345 Other mental disorders complicating the puerperium: Secondary | ICD-10-CM | POA: Diagnosis not present

## 2024-02-08 MED ORDER — SERTRALINE HCL 100 MG PO TABS: 100.0000 mg | ORAL_TABLET | Freq: Every day | ORAL | 12 refills | Status: DC

## 2024-02-08 NOTE — Progress Notes (Signed)
 Post Partum Visit Note  Anita [name] is a 28 y.o. G67P1001 female who presents for a postpartum visit. She is 4 weeks postpartum following a normal spontaneous vaginal delivery.  I have fully reviewed the prenatal and intrapartum course. The delivery was at [redacted]w[redacted]d gestational weeks.  Anesthesia: epidural. Postpartum course has been Well. Baby is doing well. Baby is feeding by breast. Bleeding no bleeding. Bowel function is normal. Bladder function is normal. Patient is not sexually active. Contraception method is none. Postpartum depression screening: negative. Pt does state that she feels as if something is coming out of her vagina and does have some vaginal itching   The pregnancy intention screening data noted above was reviewed. Potential methods of contraception were discussed. The patient elected to proceed with No data recorded.   Edinburgh Postnatal Depression Scale - 02/08/24 0830       Edinburgh Postnatal Depression Scale:  In the Past 7 Days   I have been able to laugh and see the funny side of things. 0    I have looked forward with enjoyment to things. 1    I have blamed myself unnecessarily when things went wrong. 2    I have been anxious or worried for no good reason. 1    I have felt scared or panicky for no good reason. 0    Things have been getting on top of me. 1    I have been so unhappy that I have had difficulty sleeping. 0    I have felt sad or miserable. 0    I have been so unhappy that I have been crying. 0    The thought of harming myself has occurred to me. 0    Edinburgh Postnatal Depression Scale Total 5             Health Maintenance Due  Topic Date Due   HPV VACCINES (3 - 2-dose series) 12/26/2008    The following portions of the patient's history were reviewed and updated as appropriate: allergies, current medications, past family history, past medical history, past social history, past surgical history, and problem list.  Review of  Systems Pertinent items are noted in HPI.  Objective:  BP 115/76   Pulse 76   Wt 281 lb 3.2 oz (127.6 kg)   LMP 04/08/2023 (Exact Date)   BMI 40.35 kg/m    General:  alert, cooperative, and appears stated age   Breasts:  not indicated  Lungs: clear to auscultation bilaterally  Heart:  regular rate and rhythm, S1, S2 normal, no murmur, click, rub or gallop  Abdomen: soft, non-tender; bowel sounds normal; no masses,  no organomegaly   Wound NA  GU exam:  Normal appearing, some mild irritation. Sensation is directly over her healing laceration. Reviewed return of sensation and less flexible tissue       Assessment:   Normal postpartum exam.   Plan:   Essential components of care per ACOG recommendations:  1.  Mood and well being: Patient with negative depression screening today. Reviewed local resources for support.  - Patient tobacco use? No.   - hx of drug use? No.    2. Infant care and feeding:  -Patient currently breastmilk feeding? Yes. Reviewed importance of draining breast regularly to support lactation.   Met with [facility] outpatient after our last visit and reports improved latch. She is considering tongue/lip revision -Social determinants of health (SDOH) reviewed in EPIC. No concerns  3. Sexuality, contraception and birth  spacing - Patient does not want a pregnancy in the next year.  Desired family size is 2 children.  - Reviewed reproductive life planning. Reviewed contraceptive methods based on pt preferences and effectiveness.  Patient desired FAM or LAM today.  Patient can call at anytime to start POP - Discussed birth spacing of 18 months  4. Sleep and fatigue -Encouraged family/partner/community support of 4 hrs of uninterrupted sleep to help with mood and fatigue  5. Physical Recovery  - Discussed patients delivery and complications. She describes her labor as good. - Patient had a Vaginal, no problems at delivery. Patient had a 2nd degree laceration. Perineal  healing reviewed. Patient expressed understanding - Patient has urinary incontinence? No. - Patient is safe to resume physical and sexual activity  6.  Health Maintenance - HM due items addressed Yes - Last pap smear  Diagnosis  Date Value Ref Range Status  03/14/2022   Final   - Negative for intraepithelial lesion or malignancy (NILM)   Pap smear not done at today's visit.  -Breast Cancer screening indicated? No.   7. Chronic Disease/Pregnancy Condition follow up:   #Gestational HTN: now normotensive  1. Postpartum exam (Primary)  2. Postpartum anxiety Improved, some continued sx Seeing Plan to increase to Zoloft 100mg  Meds ordered this encounter  Medications   sertraline (ZOLOFT) 100 MG tablet    Sig: Take 1 tablet (100 mg total) by mouth daily. If in 1 week you are not having severe nausea vomiting, increase to 2 tablets (50mg ) until seen    Dispense:  30 tablet    Refill:  12   - PCP follow up Return in about 6 months (around 08/10/2024) for Mood check in.   Federico Flake, MD Center for [facility], [facility]

## 2024-02-12 ENCOUNTER — Encounter: Payer: Self-pay | Admitting: Family Medicine

## 2024-02-15 ENCOUNTER — Other Ambulatory Visit: Payer: Self-pay | Admitting: Family Medicine

## 2024-02-15 DIAGNOSIS — O99345 Other mental disorders complicating the puerperium: Secondary | ICD-10-CM

## 2024-02-21 ENCOUNTER — Ambulatory Visit: Payer: Managed Care, Other (non HMO) | Admitting: Obstetrics and Gynecology

## 2024-03-03 ENCOUNTER — Encounter: Payer: Self-pay | Admitting: Family Medicine

## 2024-03-03 DIAGNOSIS — F418 Other specified anxiety disorders: Secondary | ICD-10-CM

## 2024-03-10 ENCOUNTER — Encounter: Payer: Self-pay | Admitting: Family Medicine

## 2024-03-11 MED ORDER — SERTRALINE HCL 100 MG PO TABS: 150.0000 mg | ORAL_TABLET | Freq: Every day | ORAL | 11 refills | Status: AC

## 2024-03-14 ENCOUNTER — Encounter: Payer: Self-pay | Admitting: Family Medicine

## 2024-03-21 ENCOUNTER — Other Ambulatory Visit (HOSPITAL_COMMUNITY)
Admission: RE | Admit: 2024-03-21 | Discharge: 2024-03-21 | Disposition: A | Discharge status: Home / Self Care | Source: Ambulatory Visit | Attending: Obstetrics & Gynecology | Admitting: Obstetrics & Gynecology

## 2024-03-21 ENCOUNTER — Ambulatory Visit

## 2024-03-21 DIAGNOSIS — N898 Other specified noninflammatory disorders of vagina: Secondary | ICD-10-CM

## 2024-03-21 NOTE — Progress Notes (Signed)
 SUBJECTIVE:  28 y.o. female complains of clear and white vaginal discharge for 1  week(s) or so.  Denies abnormal vaginal bleeding or significant pelvic pain or fever.Denies history of known exposure to STD. Pt requesting swab to include GC/Chlamydia   No LMP recorded.  OBJECTIVE:  She appears alert, well appearing, in no apparent distress   ASSESSMENT:  Vaginal Discharge  Vaginal Odor Vaginal itching     PLAN:  GC, chlamydia, trichomonas, BVAG, CVAG probe sent to lab. Treatment: To be determined once lab results are received ROV prn if symptoms persist or worsen.

## 2024-03-22 NOTE — Progress Notes (Signed)
Attestation of Attending Supervision of clinical support staff: I agree with the care provided to this patient and was available for any consultation.  I have reviewed the CMA's note and chart, and I agree with the management and plan.  Ambers Iyengar MD MPH, ABFM Attending Physician Faculty Practice- [facility]  

## 2024-03-24 ENCOUNTER — Encounter: Payer: Self-pay | Admitting: Family Medicine

## 2024-03-24 ENCOUNTER — Other Ambulatory Visit: Payer: Self-pay | Admitting: Family Medicine

## 2024-03-24 DIAGNOSIS — B9689 Other specified bacterial agents as the cause of diseases classified elsewhere: Secondary | ICD-10-CM

## 2024-03-24 LAB — CERVICOVAGINAL ANCILLARY ONLY
Bacterial Vaginitis (gardnerella): POSITIVE — AB
Candida Glabrata: NEGATIVE
Candida Vaginitis: NEGATIVE
Chlamydia: NEGATIVE
Comment: NEGATIVE
Comment: NEGATIVE
Comment: NEGATIVE
Comment: NEGATIVE
Comment: NEGATIVE
Comment: NORMAL
Neisseria Gonorrhea: NEGATIVE
Trichomonas: NEGATIVE

## 2024-03-24 MED ORDER — METRONIDAZOLE 500 MG PO TABS: 500.0000 mg | ORAL_TABLET | Freq: Two times a day (BID) | ORAL | 0 refills | Status: DC

## 2024-03-28 ENCOUNTER — Ambulatory Visit: Admitting: Student

## 2024-03-28 ENCOUNTER — Telehealth: Payer: Self-pay

## 2024-03-28 ENCOUNTER — Encounter: Payer: Self-pay | Admitting: Student

## 2024-03-28 ENCOUNTER — Encounter: Payer: Self-pay | Admitting: Family Medicine

## 2024-03-28 VITALS — BP 122/69 | HR 84 | Ht 69.0 in | Wt 282.2 lb

## 2024-03-28 DIAGNOSIS — K600 Acute anal fissure: Secondary | ICD-10-CM

## 2024-03-28 MED ORDER — NIFEDIPINE 0.3 % OINTMENT: 1.0000 | TOPICAL_OINTMENT | Freq: Four times a day (QID) | CUTANEOUS | 0 refills | Status: DC

## 2024-03-28 MED ORDER — LIDOCAINE 5 % EX OINT: 1.0000 | TOPICAL_OINTMENT | CUTANEOUS | 0 refills | Status: DC | PRN

## 2024-03-28 MED ORDER — POLYETHYLENE GLYCOL 3350 17 GM/SCOOP PO POWD: 17.0000 g | Freq: Every day | ORAL | 0 refills | Status: DC

## 2024-03-28 NOTE — Progress Notes (Signed)
  SUBJECTIVE:   CHIEF COMPLAINT / HPI:   Anita [name] is a 28 year old female who presents with constipation and rectal bleeding postpartum.  Constipation and rectal bleeding began after stopping Senna-S over a week ago. Stools are described as 'spiky' with blood on the outside, especially when wiping. Bleeding is not consistent with every bowel movement, but significant straining and notable bleeding occurred today. She denies prior constipation before pregnancy. Bowel movements are daily but painful and require straining. She is increasing fruit and vegetable intake but acknowledges dietary improvement is needed. No abdominal pain or blood in urine.  PERTINENT  PMH / PSH: Postpartum  OBJECTIVE:  BP 122/69   Pulse 84   Ht 5\' 9"  (1.753 m)   Wt 282 lb 3.2 oz (128 kg)   SpO2 99%   BMI 41.67 kg/m  General: Well-appearing, NAD Abdomen: Soft, nontender, normoactive bowel sounds, no suprapubic tenderness GU: 3 mm anal fissure on the anterior left side of rectum without frank bleeding  ASSESSMENT/PLAN:   Assessment & Plan Acute anal fissure Secondary to constipation.  Goal is to soften stools and treat symptomatically with MiraLAX  1-2 times daily, sitz bath's, lidocaine  ointment and nifedipine ointment.  Return if symptoms worsen or fail to improve.  Veronia Goon, DO 03/28/2024, 4:02 PM PGY-3, [facility]

## 2024-03-28 NOTE — Telephone Encounter (Signed)
 Patient calls nurse line in regards to Miralax  and Lidocaine .   She reports she was in the office today and was prescribed these two medications.   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 She reports she wanting to make sure these medications are safe in breast feeding women.   Precepted with Dawn Eth, medications are safe in breast feeding moms.   Patient appreciative.

## 2024-03-28 NOTE — Patient Instructions (Addendum)
 It was great to see you today! Thank you for choosing [facility] for your primary care.  Today we addressed: Anal fissure: I have attached a handout for you to read through.  Warm sitz bath's that you can sit in are very helpful for improving blood flow to the anal mucosa and allowing this area to heal better.  You may do this 2-3 times a day.  I have prescribed lidocaine  ointment to use there as well.  You do need to improve the softness of your stool to which I have prescribed MiraLAX  for you to take 1-2 times daily.  Increased fiber in your diet will also be helpful. I have also prescribed you a customized ointment of nifedipine which needs to be compounded at a compounding pharmacy.  [facility] to [facility] on [address]  If you haven't already, sign up for My Chart to have easy access to your labs results, and communication with your primary care physician.  Return if symptoms worsen or fail to improve. Please arrive 15 minutes before your appointment to ensure smooth check in process.  We appreciate your efforts in making this happen.  Thank you for allowing me to participate in your care, Veronia Goon, DO 03/28/2024, 3:53 PM PGY-3, [facility]

## 2024-04-08 ENCOUNTER — Other Ambulatory Visit (HOSPITAL_COMMUNITY)
Admission: RE | Admit: 2024-04-08 | Discharge: 2024-04-08 | Disposition: A | Discharge status: Home / Self Care | Source: Ambulatory Visit | Attending: Obstetrics & Gynecology | Admitting: Obstetrics & Gynecology

## 2024-04-08 ENCOUNTER — Encounter: Payer: Self-pay | Admitting: Obstetrics & Gynecology

## 2024-04-08 ENCOUNTER — Ambulatory Visit (INDEPENDENT_AMBULATORY_CARE_PROVIDER_SITE_OTHER): Admitting: Obstetrics & Gynecology

## 2024-04-08 VITALS — BP 117/74 | HR 69 | Wt 286.0 lb

## 2024-04-08 DIAGNOSIS — N760 Acute vaginitis: Secondary | ICD-10-CM | POA: Diagnosis present

## 2024-04-08 NOTE — Progress Notes (Signed)
   GYNECOLOGY OFFICE VISIT NOTE  History:  Anita [name] is a 28 y.o. G1P1001 here today for evaluation of persistent abnormal vaginal discharge and irritation.  Recently diagnosed with bacterial vaginitis in 03/21/24, took oral Metronidazole  but symptoms persisted.  Not sexually active.  Using facial cleansers to wash her vulva.   She denies any abnormal vaginal  bleeding, pelvic pain or other concerns.  Past Medical History:  Diagnosis Date   Chlamydia infection    Depression 01/17/2018   Gestational hypertension 01/10/2024   Lipoma 05/03/2021   Migraine without aura 01/31/2007   Qualifier: Diagnosis of   By: Lanetta Pion MD, Baird Letters    Past Surgical History:  Procedure Laterality Date   APPENDECTOMY     BARTHOLIN CYST MARSUPIALIZATION N/A 12/31/2019   Procedure: BARTHOLIN CYST MARSUPIALIZATION;  Surgeon: Mallory Seaman, MD;  Location: [facility];  Service: Gynecology;  Laterality: N/A;   Nexplanon       Inserted 09-14-17   The following portions of the patient's history were reviewed and updated as appropriate: allergies, current medications, past family history, past medical history, past social history, past surgical history and problem list.   Health Maintenance:  Normal pap on 03/14/2022.   Review of Systems:  Pertinent items noted in HPI and remainder of comprehensive ROS otherwise negative.  Physical Exam:  BP 117/74   Pulse 69   Wt 286 lb (129.7 kg)   BMI 42.23 kg/m  CONSTITUTIONAL: Well-developed, well-nourished female in no acute distress.  MUSCULOSKELETAL: Normal range of motion. No edema noted. NEUROLOGIC: Alert and oriented to person, place, and time. Normal muscle tone coordination. No cranial nerve deficit noted on observation. PSYCHIATRIC: Normal mood and affect. Normal behavior. Normal judgment and thought content. CARDIOVASCULAR: Normal heart rate noted RESPIRATORY: Effort and breath sounds normal, no problems with respiration  noted ABDOMEN: No masses or other overt distention noted on observation. No tenderness.   PELVIC: Normal appearing external genitalia; normal urethral meatus; normal appearing vaginal mucosa. Thin, white vaginal discharge noted, testing sample obtained.     Assessment and Plan:     1. Recurrent vaginitis (Primary) Discussed etiology of recurrent vaginitis, need to adhere to proper vulvovaginal hygiene: discussed avoidance of perfumed soaps (recommended bar of Dove sensitive soap), detergents, lotions and any type of douches.  Discussed possible treatment of sexual partner, she reports she is not sexually active.  If this results as BV again, will consider prolonged vaginal Metronidazole  therapy (MetroGel  0.75% Apply one applicatorful to vagina at bedtime for 10 days, then twice a week for 6 months) versus or followed by prolonged vaginal boric acid therapy.  - Cervicovaginal ancillary only( [place])  Routine preventative health maintenance measures emphasized. Please refer to After Visit Summary for other counseling recommendations.   Return for any gynecologic concerns.    I spent 25 minutes dedicated to the care of this patient including pre-visit review of records, face to face time with the patient discussing her conditions and treatments, post visit ordering of medications and appropriate tests or procedures, coordinating care and documenting this visit encounter.    Lenoard Rad, MD, FACOG Obstetrician & Gynecologist, [facility] for [employer], [facility]

## 2024-04-09 ENCOUNTER — Ambulatory Visit: Payer: Self-pay | Admitting: Obstetrics & Gynecology

## 2024-04-09 DIAGNOSIS — N760 Acute vaginitis: Secondary | ICD-10-CM

## 2024-04-09 DIAGNOSIS — B9689 Other specified bacterial agents as the cause of diseases classified elsewhere: Secondary | ICD-10-CM

## 2024-04-09 LAB — CERVICOVAGINAL ANCILLARY ONLY
Bacterial Vaginitis (gardnerella): POSITIVE — AB
Candida Glabrata: NEGATIVE
Candida Vaginitis: NEGATIVE
Chlamydia: NEGATIVE
Comment: NEGATIVE
Comment: NEGATIVE
Comment: NEGATIVE
Comment: NEGATIVE
Comment: NEGATIVE
Comment: NORMAL
Neisseria Gonorrhea: NEGATIVE
Trichomonas: NEGATIVE

## 2024-04-09 MED ORDER — METRONIDAZOLE 500 MG PO TABS: 500.0000 mg | ORAL_TABLET | Freq: Two times a day (BID) | ORAL | 0 refills | Status: DC

## 2024-04-10 ENCOUNTER — Other Ambulatory Visit: Payer: Self-pay

## 2024-04-14 MED ORDER — METRONIDAZOLE 0.75 % VA GEL: 1.0000 | Freq: Every day | VAGINAL | 5 refills | Status: DC

## 2024-04-22 ENCOUNTER — Telehealth: Payer: Self-pay | Admitting: Lactation Services

## 2024-04-22 NOTE — Telephone Encounter (Signed)
 Calling to return voice mail call about taking Garden of Life Probiotics and AZO Boric Acid Suppositories vaginally while breastfeeding to help treat persistent BV. Mom asked if its safe to take while breastfeeding and shared she is not feeling supported with her BV that is persistent.  Lekeya asked about volume intakes for her baby [place] and shared breastfeeding is going well and they are continuing to nurse without the nipple shield. Lactation consultant discussed safety of Garden of Life probiotics and AZO Boric Acid Suppositories vaginally, and shared that Dr. Elester Grim confirmed they were safe to use while breastfeeding.  Reviewed sharing with Kymari's OB practice that Markeita would like some additional support with her BV concerns. Answered Desiray's question about infant intake volume when bottle feeding breast milk. Encouraged to reach out with any future lactation questions or concerns. Follow up as needed. Orlie Bjornstad IBCLC

## 2024-04-22 NOTE — Telephone Encounter (Signed)
 Called to return hotline voice mail asking about supplements and lactation. Called once, no answer, did not leave message. Will follow up shortly.  Orlie Bjornstad IBCLC

## 2024-05-01 ENCOUNTER — Ambulatory Visit (INDEPENDENT_AMBULATORY_CARE_PROVIDER_SITE_OTHER): Admitting: Student

## 2024-05-01 VITALS — BP 100/60 | HR 64 | Wt 280.8 lb

## 2024-05-01 DIAGNOSIS — Z0289 Encounter for other administrative examinations: Secondary | ICD-10-CM | POA: Insufficient documentation

## 2024-05-01 NOTE — Assessment & Plan Note (Signed)
 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

## 2024-05-01 NOTE — Patient Instructions (Signed)
 It was great seeing you today.  As we discussed, - Form completed   If you have any questions or concerns, please feel free to call the clinic.   Have a wonderful day,  Dr. Vallorie Gayer [facility] [phone]

## 2024-05-01 NOTE — Progress Notes (Signed)
    SUBJECTIVE:   CHIEF COMPLAINT / HPI:   Form completion Works in Industrial/product designer, Child psychotherapist Just needed signature on form for medical release of information if needed Has no other acute concerns today Doing well with her infant, who is now 59 months old   PERTINENT  PMH / PSH: Reviewed, nonpertinent  OBJECTIVE:   BP 100/60   Pulse 64   Wt 280 lb 12.8 oz (127.4 kg)   SpO2 96%   BMI 41.47 kg/m   General: Well-appearing, pleasant Respiratory: Normal effort on room air Psych: Normal mood and affect.  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

## 2024-05-23 ENCOUNTER — Encounter: Payer: Self-pay | Admitting: Nurse Practitioner

## 2024-05-23 ENCOUNTER — Ambulatory Visit (INDEPENDENT_AMBULATORY_CARE_PROVIDER_SITE_OTHER): Admitting: Nurse Practitioner

## 2024-05-23 VITALS — BP 118/76 | HR 74

## 2024-05-23 DIAGNOSIS — N898 Other specified noninflammatory disorders of vagina: Secondary | ICD-10-CM

## 2024-05-23 DIAGNOSIS — N760 Acute vaginitis: Secondary | ICD-10-CM

## 2024-05-23 DIAGNOSIS — B9689 Other specified bacterial agents as the cause of diseases classified elsewhere: Secondary | ICD-10-CM

## 2024-05-23 LAB — WET PREP FOR TRICH, YEAST, CLUE

## 2024-05-23 MED ORDER — METRONIDAZOLE 0.75 % VA GEL: 1.0000 | Freq: Every day | VAGINAL | 0 refills | Status: AC

## 2024-05-23 MED ORDER — METRONIDAZOLE 500 MG PO TABS: 500.0000 mg | ORAL_TABLET | Freq: Two times a day (BID) | ORAL | 0 refills | Status: DC

## 2024-05-23 NOTE — Progress Notes (Signed)
   Acute Office Visit  Subjective:    Patient ID: Anita [name], female    DOB: 1996-05-26, 28 y.o.   MRN: [id number]   HPI 28 y.o. presents today for recurrent BV. + BV 03/21/2024 and treated with oral Metronidazole  at [facility] office, + BV 04/08/24 and treated with prolonged Metrogel  course X 10 days. Did not feel symptoms resolved even while taking meds. Having odor and some discomfort today. Had baby in February, breastfeeding. Became sexually active again in April with condom at that time. Has not been sexually active since diagnosed with BV the first time in May. Did change soaps around that time and was using a facial wash on vulva. Has now switched to dove sensitive soap. Taking women's probiotic. Using sensitive laundry detergent since having daughter.   Review of Systems  Constitutional: Negative.   Genitourinary:  Positive for vaginal discharge and vaginal pain.       Vaginal odor       Objective:    Physical Exam Constitutional:      Appearance: Normal appearance.  Genitourinary:    General: Normal vulva.     Vagina: Vaginal discharge present. No erythema.     Cervix: Normal.     BP 118/76 (BP Location: Left Arm, Patient Position: Sitting)   Pulse 74   Breastfeeding Yes  Wt Readings from Last 3 Encounters:  05/01/24 280 lb 12.8 oz (127.4 kg)  04/08/24 286 lb (129.7 kg)  03/28/24 282 lb 3.2 oz (128 kg)        Wet prep + clue cells (+ odor)  Assessment & Plan:   Problem List Items Addressed This Visit   None Visit Diagnoses       Recurrent vaginitis    -  Primary   Relevant Medications   metroNIDAZOLE  (METROGEL ) 0.75 % vaginal gel   Other Relevant Orders   SureSwab Mycoplasma/Ureaplasma, PCR     Vaginal odor       Relevant Orders   WET PREP FOR TRICH, YEAST, CLUE     Bacterial vaginosis       Relevant Medications   metroNIDAZOLE  (FLAGYL ) 500 MG tablet         Plan: Wet prep positive for clue cells - Flagyl  500 mg BID x 7 days. Asked about Silosec  but not recommended while breastfeeding. Recommend long-term course of Metrogel  twice weekly x 16 weeks starting one week after completing oral Flagyl . Mycoplasma/ureaplasma pending. Continue good hygiene practices, avoid harsh soaps and detergents, continue probiotic.   Return if symptoms worsen or fail to improve.    Anita DELENA Shutter DNP, 3:25 PM 05/23/2024

## 2024-05-28 LAB — SURESWAB MYCOPLASMA/UREAPLASMA, PCR
M. hominis DNA: NOT DETECTED
MYCOPLASMA GENITALIUM, rRNA,TMA: NOT DETECTED
U. parvum DNA: NOT DETECTED
U. urealyticum DNA: NOT DETECTED

## 2024-05-29 ENCOUNTER — Ambulatory Visit: Payer: Self-pay | Admitting: Nurse Practitioner

## 2024-05-30 ENCOUNTER — Encounter: Payer: Self-pay | Admitting: Advanced Practice Midwife

## 2024-05-30 ENCOUNTER — Ambulatory Visit (INDEPENDENT_AMBULATORY_CARE_PROVIDER_SITE_OTHER): Admitting: Family Medicine

## 2024-05-30 VITALS — BP 130/83 | HR 88 | Wt 289.0 lb

## 2024-05-30 DIAGNOSIS — N760 Acute vaginitis: Secondary | ICD-10-CM | POA: Diagnosis not present

## 2024-05-30 MED ORDER — SOLOSEC 2 G PO PACK
2.0000 g | PACK | Freq: Once | ORAL | 1 refills | Status: AC
Start: 2024-05-30 — End: 2024-05-30

## 2024-05-30 NOTE — Patient Instructions (Signed)
Natural Remedies for Bacterial Vaginosis ° °Option #1 °1 Tbsp Fractitionated Coconut Oil °10 drops of Melaleuca (Tea Tree) Oil ° °Mix ingredients together well.  Soak 3-4 tampons (in applicators) in that mixture until all or mostly all mixture is soaked up into the tampons.  Insert 1 saturated tampon vaginally and wear overnight for 3-4 nights.   ° °Option #2 (sometimes to be used in conjunction with option #1) °Fill tub with enough to cover lap/lower abdomen warm water.  Mix 1/2 cup of baking soda in water.  Soak in water/baking soda mixture for at least 20 minutes.  Be sure to swish water in between legs to get as much in vagina as possible.  This soak should be done after sexual intercourse and menstrual cycles.   ° ° °Option #3 (sometimes to be used in conjunction with option #1 and 2) °Fill tub with enough to cover lap/lower abdomen warm water.  Mix 2-4 cup of apple cider vinegar in water.  Soak in water/vinegar mixture for at least 20 minutes.  Be sure to swish water in between legs to get as much in vagina as possible.  This soak should be done after sexual intercourse and menstrual cycles.   ° °GO WHITE: °Soap: UNSCENTED Dove (white box light green writing) °Laundry detergent (underwear)- Dreft or [employer] n[employer] unscented °WHITE 100% cotton panties (NOT just cotton crouch) °Sanitary napkin/panty liners: UNSCENTED.  If it doesn't SAY unscented it can have a scent/perfume    °NO PERFUMES OR LOTIONS OR POTIONS in the vulvar area (may use regular KY) °Condoms: hypoallergenic only. Non dyed (no color) °Toilet papers: white only °Wash clothes: use a separate wash cloth. WHITE.  Wash in Dreft.  ° °You can purchase Tea Tree Oil locally at: ° °[employer] °[address] °[place] [address] ° °[employer] °[address] °[place] [address] ° °Advise that these alternatives will not replace the need to be evaluated if symptoms persist. You will need to seek care at an OB/GYN provider. ° °

## 2024-05-30 NOTE — Progress Notes (Signed)
 CC: Still having discharge and odor, started metronidazole  on 7/11 where she had swabs which were negative for ureaplasma and mycoplasma   Had intercourse on Wednesday and by Friday 05/23/24 she was having vaginal discharge odor and discomfort    Limited benefit with the metronidazole , helped with the discomfort.

## 2024-05-30 NOTE — Progress Notes (Signed)
   GYNECOLOGY PROBLEM  VISIT ENCOUNTER NOTE  Subjective:   Anita [name] is a 28 y.o. G86P1001 female here for a problem GYN visit.  Current complaints: recurrent BV  Reports sx since delivery-- sx include discharge, irritation and odor. She reports still breastfeeding and has not resumed menstrual cycles. She reports frequent flares and is worried about why this is happening. She notes worsening sx after recent sexual activity. She used condoms with partner. Tried multiple oral metronidazole  treatments and also metro gel. She heard from a friend about solosec . She also reports partner has not been treated.   Reviewed recent testing with PCP for mycoplasma and wet prep that showed BV (on 7/11). Was given metro gel and has not started.  Denies abnormal vaginal bleeding, discharge, pelvic pain, problems with intercourse or other gynecologic concerns.    Gynecologic History No LMP recorded.  Contraception: condoms  Health Maintenance Due  Topic Date Due   HPV VACCINES (3 - 2-dose series) 12/26/2008   Hepatitis B Vaccines (1 of 3 - 19+ 3-dose series) Never done    The following portions of the patient's history were reviewed and updated as appropriate: allergies, current medications, past family history, past medical history, past social history, past surgical history and problem list.  Review of Systems Pertinent items are noted in HPI.   Objective:  BP 130/83   Pulse 88   Wt 289 lb (131.1 kg)   BMI 42.68 kg/m   Gen: well appearing, NAD HEENT: no scleral icterus CV: RR Lung: Normal WOB Ext: warm well perfused   Assessment and Plan:  1. Recurrent vaginitis (Primary) Provided validating listening and reflection as this process is very frustrating Reviewed the she might want to try solosec  but not safe/studied in breastfeeding  Discussed possibility of using freezer stash and pumping and saving for NON-feeding purposes. Reviewed that her symptoms are not dangerous and are  uncomfortable and that I would recommend weighing her discomfort vaginally with her feeding plans Recommended regardless probiotic and other non-medication management methods.  Discussed also that returning to menstrual cycles might help if her symptoms are concerning for her and this might be achieved naturally through gradual breastmilk reduction/weaning vs starting OCP Discussed also option for recurrent BV dosing with metro gel  Patient would like rx for solosec  and will only start if she is prepared to pump/save milk for non-feeding purposes.  - Secnidazole  (SOLOSEC ) 2 g PACK; Take 2 g by mouth once for 1 dose.  Dispense: 2 each; Refill: 1   Please refer to After Visit Summary for other counseling recommendations.   Return in about 3 months (around 08/30/2024), or if symptoms worsen or fail to improve.  Suzen Maryan Masters, MD, MPH, ABFM Attending Physician Faculty Practice- [facility]

## 2024-06-02 ENCOUNTER — Ambulatory Visit: Admitting: Family Medicine

## 2024-06-02 ENCOUNTER — Encounter: Payer: Self-pay | Admitting: Family Medicine

## 2024-06-06 ENCOUNTER — Telehealth: Payer: Self-pay | Admitting: Lactation Services

## 2024-06-06 NOTE — Telephone Encounter (Signed)
 Mom called and LM that she  has some questions about taking Choline vs Sunflower Lecithin.   Returned call to mom, she did not answer. LM that she is welcome to use Choline instead of Sunflower Lecithin and reviewed dosage is 550 mg daily. Asked her to call us  back with any further questions or concerns.

## 2024-06-19 ENCOUNTER — Ambulatory Visit (INDEPENDENT_AMBULATORY_CARE_PROVIDER_SITE_OTHER): Admitting: Family Medicine

## 2024-06-19 ENCOUNTER — Other Ambulatory Visit: Payer: Self-pay | Admitting: Family Medicine

## 2024-06-19 ENCOUNTER — Encounter: Payer: Self-pay | Admitting: Family Medicine

## 2024-06-19 MED ORDER — MOUNJARO 2.5 MG/0.5ML ~~LOC~~ SOAJ: 2.5000 mg | SUBCUTANEOUS | 2 refills | Status: DC

## 2024-06-19 NOTE — Patient Instructions (Signed)
 I have sent a prescription for Mounjaro , an injectable medication for weight loss.  - Start with 2.5mg  once a week injection. - Common side effects include stomach upset, nausea, diarrhea. This usually improves as you keep taking it.  - If you decide not to start it, just let your doctor know

## 2024-06-19 NOTE — Progress Notes (Signed)
    SUBJECTIVE:   CHIEF COMPLAINT / HPI:   AW is a 28yo F that pf weight and nutrition counseling. - Pt is interested in switching the vitamins that she is taking. She was previously taking an iron supplement but wonders if she still needs it.   - No dietary restrictions - Also struggling with weight loss, especially in post-partum time. She has also struggled with weight in the past as well, since teenage years, despite diet and exercise. - Is currently breastfed.   - Not currently on contraception, but does use condoms 100% of time. Does not currently desire pregancy, she is 5 month postpartum - She wonders about the safety of breastfeeding and using GLP1   PERTINENT  PMH / PSH: obesity  OBJECTIVE:   BP 117/69   Pulse 83   Ht 5' 9 (1.753 m)   Wt 280 lb 6.4 oz (127.2 kg)   SpO2 99%   BMI 41.41 kg/m   General: Alert, pleasant woman. NAD. HEENT: NCAT. MMM. CV: RRR, no murmurs.   Resp: CTAB, no wheezing or crackles. Normal WOB on RA.  Abm: Soft, nontender, nondistended. BS present. Ext: Moves all ext spontaneously Skin: Warm, well perfused   ASSESSMENT/PLAN:   Assessment & Plan Morbid obesity (HCC) BMI 41.41, has tried and failed lifestyle modifications. Discussed that mounjaro  should be safe in breastfeeding, however there is limited established data. Pt would like to do some research, but is still potentially interested in starting mounjaro . - Sent Mounjaro  2.5mg  weekly. If pt decides not to start, she will let us  know.  - Can consider metformin in future - Recent 2/25 Hgb wnl. Advised that pt can take a regular prenatal vitamin, no additional iron needed.      Twyla Nearing, MD [facility]

## 2024-06-20 ENCOUNTER — Telehealth: Payer: Self-pay | Admitting: Lactation Services

## 2024-06-20 NOTE — Telephone Encounter (Signed)
 Calling Anita [name] to return hotline voice mail to discuss prenatal vitamins, supplements and Mounjaro  GLP1 and how it impacts breast milk/ breastfeeding. States baby bit her and has teeth, asked for tips.  Reviewed answering moms questions, suggested prenatal/postnatals, reviewed options for blood work with provider if needing specific supplements for omegas/iron etc. Reviewed Mounjaro  is an L3 in [facility] and considered Presumed Compatible. Reviewed tips for biting and encouraged to continue to reach out anytime with lactation questions or concerns.   Anita [name] IBCLC

## 2024-06-23 ENCOUNTER — Encounter: Payer: Self-pay | Admitting: Family Medicine

## 2024-07-10 ENCOUNTER — Other Ambulatory Visit (HOSPITAL_COMMUNITY): Payer: Self-pay

## 2024-07-10 ENCOUNTER — Telehealth: Payer: Self-pay

## 2024-07-10 NOTE — Telephone Encounter (Signed)
 Mounjaro  requires dx of diabetes.   Wegovy  used for weight loss/obesity

## 2024-07-10 NOTE — Telephone Encounter (Signed)
 Patient calls nurse line regarding status of PA on Mounjaro .   Advised that I would send message to [facility] for update.   Patient is requesting update as soon as possible.   Chiquita JAYSON English, RN

## 2024-07-17 ENCOUNTER — Encounter: Payer: Self-pay | Admitting: Family Medicine

## 2024-07-17 MED ORDER — SEMAGLUTIDE-WEIGHT MANAGEMENT 0.25 MG/0.5ML ~~LOC~~ SOAJ
0.2500 mg | SUBCUTANEOUS | 0 refills | Status: DC
Start: 2024-07-17 — End: 2024-08-25

## 2024-07-18 ENCOUNTER — Telehealth: Payer: Self-pay

## 2024-07-18 ENCOUNTER — Other Ambulatory Visit (HOSPITAL_COMMUNITY): Payer: Self-pay

## 2024-07-18 NOTE — Telephone Encounter (Signed)
 Prior authorization submitted for WEGOVY  0.25MG  to [employer] via PROMPTPA/ RX BENEFITS.   Key: [id number]

## 2024-07-18 NOTE — Telephone Encounter (Signed)
 Pharmacy Patient Advocate Encounter  Received notification from RXBENEF[employer] that Prior Authorization for WEGOVY  0.25/0.5MG  has been APPROVED from 07/18/24 to 01/15/25   PA #/Case ID/Reference #: [id number]

## 2024-08-01 ENCOUNTER — Ambulatory Visit: Admitting: Radiology

## 2024-08-15 ENCOUNTER — Ambulatory Visit: Admitting: Radiology

## 2024-08-20 ENCOUNTER — Encounter: Payer: Self-pay | Admitting: Radiology

## 2024-08-20 ENCOUNTER — Ambulatory Visit: Admitting: Radiology

## 2024-08-20 VITALS — BP 128/80 | HR 84 | Ht 70.0 in | Wt 281.0 lb

## 2024-08-20 DIAGNOSIS — Z30018 Encounter for initial prescription of other contraceptives: Secondary | ICD-10-CM

## 2024-08-20 DIAGNOSIS — N898 Other specified noninflammatory disorders of vagina: Secondary | ICD-10-CM

## 2024-08-20 DIAGNOSIS — N760 Acute vaginitis: Secondary | ICD-10-CM | POA: Diagnosis not present

## 2024-08-20 DIAGNOSIS — B9689 Other specified bacterial agents as the cause of diseases classified elsewhere: Secondary | ICD-10-CM

## 2024-08-20 LAB — WET PREP FOR TRICH, YEAST, CLUE

## 2024-08-20 MED ORDER — CLINDAMYCIN PHOSPHATE 2 % VA CREA: 1.0000 | TOPICAL_CREAM | Freq: Every day | VAGINAL | 0 refills | Status: DC

## 2024-08-20 MED ORDER — PHEXXI 1.8-1-0.4 % VA GEL
5.0000 g | VAGINAL | 6 refills | Status: DC
Start: 2024-08-20 — End: 2024-09-10

## 2024-08-20 NOTE — Progress Notes (Signed)
      Subjective: Anita [name] is a 28 y.o. female who complains of discharge since May. Has been treated with metrogel , solosec  and flagyl  with no relief. Neg myco/ureaplasma 7/25.    Review of Systems  All other systems reviewed and are negative.   Past Medical History:  Diagnosis Date   Chlamydia infection    Depression 01/17/2018   Gestational hypertension 01/10/2024   Lipoma 05/03/2021   Migraine without aura 01/31/2007   Qualifier: Diagnosis of   By: Jeanelle MD, Layman         Migraines       Objective:  Today's Vitals   08/20/24 1009  BP: 128/80  Pulse: 84  SpO2: 99%  Weight: 281 lb (127.5 kg)  Height: 5' 10 (1.778 m)   Body mass index is 40.32 kg/m.   Physical Exam Vitals and nursing note reviewed. Exam conducted with a chaperone present.  Constitutional:      Appearance: Normal appearance. She is well-developed.  Pulmonary:     Effort: Pulmonary effort is normal.  Abdominal:     General: Abdomen is flat.     Palpations: Abdomen is soft.  Genitourinary:    General: Normal vulva.     Vagina: Vaginal discharge present. No erythema, bleeding or lesions.     Cervix: Normal. No discharge, friability, lesion or erythema.     Uterus: Normal.      Adnexa: Right adnexa normal and left adnexa normal.  Neurological:     Mental Status: She is alert.  Psychiatric:        Mood and Affect: Mood normal.        Thought Content: Thought content normal.        Judgment: Judgment normal.     Microscopic wet-mount exam shows clue cells.   Darice Hoit, CMA present for exam  Assessment:/Plan:  1. Vaginal discharge (Primary) - WET PREP FOR TRICH, YEAST, CLUE  2. BV (bacterial vaginosis) - clindamycin (CLEOCIN) 2 % vaginal cream; Place 1 Applicatorful vaginally at bedtime.  Dispense: 40 g; Refill: 0  3. Encounter for initial prescription of other contraceptives - Lactic Ac-Citric Ac-Pot Bitart (PHEXXI) 1.8-1-0.4 % GEL; Place 5 g vaginally as directed.  Before intercourse  Dispense: 120 g; Refill: 6   Will contact patient with results of testing completed today. Avoid intercourse until symptoms are resolved. Safe sex encouraged. Avoid the use of soaps or perfumed products in the peri area. Avoid tub baths and sitting in sweaty or wet clothing for prolonged periods of time.     Henli Hey B, NP 10:22 AM

## 2024-08-25 ENCOUNTER — Other Ambulatory Visit: Payer: Self-pay

## 2024-08-26 ENCOUNTER — Ambulatory Visit: Admitting: Radiology

## 2024-09-04 NOTE — Telephone Encounter (Signed)
 I would recommend OV for sureswab to send out.

## 2024-09-10 ENCOUNTER — Encounter: Payer: Self-pay | Admitting: Radiology

## 2024-09-10 ENCOUNTER — Ambulatory Visit (INDEPENDENT_AMBULATORY_CARE_PROVIDER_SITE_OTHER): Admitting: Radiology

## 2024-09-10 VITALS — BP 122/82 | HR 84 | Ht 69.0 in | Wt 283.0 lb

## 2024-09-10 DIAGNOSIS — N760 Acute vaginitis: Secondary | ICD-10-CM | POA: Diagnosis not present

## 2024-09-10 NOTE — Progress Notes (Signed)
      Subjective: Anita [name] is a 28 y.o. female who complains of persistent BV symptoms. Was treated with vaginal clindamycin earlier this month with no improvement. Negative myco/ureaplasma testing 2 months ago. No new partners, using condoms with every sexual encounter.    Review of Systems  All other systems reviewed and are negative.   Past Medical History:  Diagnosis Date   Chlamydia infection    Depression 01/17/2018   Gestational hypertension 01/10/2024   Lipoma 05/03/2021   Migraine without aura 01/31/2007   Qualifier: Diagnosis of   By: Jeanelle MD, Layman         Migraines       Objective:  Today's Vitals   09/10/24 0901  BP: 122/82  Pulse: 84  SpO2: 99%  Weight: 283 lb (128.4 kg)  Height: 5' 9 (1.753 m)   Body mass index is 41.79 kg/m.   Physical Exam Vitals and nursing note reviewed. Exam conducted with a chaperone present.  Constitutional:      Appearance: Normal appearance. She is well-developed.  Pulmonary:     Effort: Pulmonary effort is normal.  Abdominal:     General: Abdomen is flat.     Palpations: Abdomen is soft.  Genitourinary:    General: Normal vulva.     Vagina: Vaginal discharge and bleeding present. No erythema or lesions.     Cervix: Normal. No discharge, friability, lesion or erythema.     Uterus: Normal.      Adnexa: Right adnexa normal and left adnexa normal.  Neurological:     Mental Status: She is alert.  Psychiatric:        Mood and Affect: Mood normal.        Thought Content: Thought content normal.        Judgment: Judgment normal.     Darice Hoit, CMA present for exam  Assessment:/Plan:   1. Recurrent vaginitis (Primary) - SureSwab Advanced Vaginitis Plus,TMA - Consider partner treatment  Will contact patient with results of testing completed today. Avoid intercourse until symptoms are resolved. Safe sex encouraged. Avoid the use of soaps or perfumed products in the peri area. Avoid tub baths and  sitting in sweaty or wet clothing for prolonged periods of time.    Montell Leopard B, NP 9:18 AM

## 2024-09-11 LAB — SURESWAB® ADVANCED VAGINITIS PLUS,TMA
C. trachomatis RNA, TMA: NOT DETECTED
CANDIDA SPECIES: NOT DETECTED
Candida glabrata: NOT DETECTED
N. gonorrhoeae RNA, TMA: NOT DETECTED
SURESWAB(R) ADV BACTERIAL VAGINOSIS(BV),TMA: NEGATIVE
TRICHOMONAS VAGINALIS (TV),TMA: NOT DETECTED

## 2024-09-12 ENCOUNTER — Ambulatory Visit: Payer: Self-pay | Admitting: Radiology

## 2024-09-30 NOTE — Telephone Encounter (Signed)
 Recommend OV. Was negative for BV at last visit.

## 2024-10-02 ENCOUNTER — Ambulatory Visit (INDEPENDENT_AMBULATORY_CARE_PROVIDER_SITE_OTHER): Admitting: Nurse Practitioner

## 2024-10-02 ENCOUNTER — Encounter: Payer: Self-pay | Admitting: Nurse Practitioner

## 2024-10-02 VITALS — BP 114/64 | HR 84

## 2024-10-02 DIAGNOSIS — N760 Acute vaginitis: Secondary | ICD-10-CM

## 2024-10-02 DIAGNOSIS — N898 Other specified noninflammatory disorders of vagina: Secondary | ICD-10-CM

## 2024-10-02 LAB — WET PREP FOR TRICH, YEAST, CLUE

## 2024-10-02 NOTE — Progress Notes (Signed)
   Acute Office Visit  Subjective:    Patient ID: Anita [name], female    DOB: 1996-10-20, 28 y.o.   MRN: [id number]   HPI 28 y.o. presents today for yellow discharge with no odor or itching/irritation. Negative for BV and yeast 09/12/2024. + BV 10/8, 7/11, 5/9. H/O recurrent BV. Neg ureaplasma/mycoplasma in July. Doing boric acid once a week.   Patient's last menstrual period was 09/07/2024 (exact date). Period Duration (Days): 4 Period Pattern: Regular Menstrual Flow: Moderate Menstrual Control: Maxi pad, Tampon Dysmenorrhea: None  Review of Systems  Constitutional: Negative.   Genitourinary:  Positive for vaginal discharge. Negative for vaginal pain.       Objective:    Physical Exam Constitutional:      Appearance: Normal appearance.  Genitourinary:    General: Normal vulva.     Vagina: Vaginal discharge present. No erythema.     Cervix: Normal.     BP 114/64   Pulse 84   LMP 09/07/2024 (Exact Date)   SpO2 99%   Breastfeeding Yes  Wt Readings from Last 3 Encounters:  09/10/24 283 lb (128.4 kg)  08/20/24 281 lb (127.5 kg)  06/19/24 280 lb 6.4 oz (127.2 kg)        Zada Louder, CMA present as chaperone.   Wet prep negative for pathogens  Assessment & Plan:   Problem List Items Addressed This Visit   None Visit Diagnoses       Vaginal discharge    -  Primary   Relevant Orders   WET PREP FOR TRICH, YEAST, CLUE     Recurrent vaginitis          Plan: Negative wet prep. Recommend increasing boric acid to twice a week, wash vulva with water only, begin women's probiotic.   Return if symptoms worsen or fail to improve.    Annabella DELENA Shutter DNP, 2:02 PM 10/02/2024

## 2024-12-11 ENCOUNTER — Encounter: Payer: Self-pay | Admitting: Radiology

## 2024-12-11 ENCOUNTER — Ambulatory Visit (INDEPENDENT_AMBULATORY_CARE_PROVIDER_SITE_OTHER): Admitting: Radiology

## 2024-12-11 ENCOUNTER — Other Ambulatory Visit (HOSPITAL_COMMUNITY)
Admission: RE | Admit: 2024-12-11 | Discharge: 2024-12-11 | Disposition: A | Discharge status: Home / Self Care | Source: Ambulatory Visit | Attending: Radiology | Admitting: Radiology

## 2024-12-11 VITALS — BP 118/76 | HR 84 | Ht 69.0 in | Wt 293.0 lb

## 2024-12-11 DIAGNOSIS — Z01419 Encounter for gynecological examination (general) (routine) without abnormal findings: Secondary | ICD-10-CM | POA: Diagnosis present

## 2024-12-11 DIAGNOSIS — N760 Acute vaginitis: Secondary | ICD-10-CM | POA: Diagnosis not present

## 2024-12-11 DIAGNOSIS — Z1331 Encounter for screening for depression: Secondary | ICD-10-CM

## 2024-12-11 DIAGNOSIS — Z113 Encounter for screening for infections with a predominantly sexual mode of transmission: Secondary | ICD-10-CM

## 2024-12-11 NOTE — Progress Notes (Signed)
 "  Anita [name] 1996/10/05 [id number]   History:  29 y.o. G1P1 presents for annual exam. Complains of vaginal discharge and odor. Symptoms x's 1 week. Desires STI screening. Patient is breast feeding. C/o occasional boils on buttocks and inner thighs.  Gynecologic History Patient's last menstrual period was 11/30/2024 (exact date). Period Cycle (Days): 28 Period Duration (Days): 4 Period Pattern: Regular Menstrual Flow: Moderate Menstrual Control: Thin pad, Maxi pad, Tampon Dysmenorrhea: None Contraception/Family planning: condoms Sexually active: yes Last Pap: 2023. Results were: normal   Obstetric History OB History  Gravida Para Term Preterm AB Living  1 1 1  0 0 1  SAB IAB Ectopic Multiple Live Births  0 0 0 0 1    # Outcome Date GA Lbr Len/2nd Weight Sex Type Anes PTL Lv  1 Term 01/10/24 [redacted]w[redacted]d  8 lb 2.9 oz (3.71 kg) F Vag-Spont EPI  LIV       12/11/2024    4:09 PM 06/19/2024    3:32 PM 03/28/2024    3:38 PM  Depression screen PHQ 2/9  Decreased Interest 0 3 1  Down, Depressed, Hopeless 0 1 0  PHQ - 2 Score 0 4 1  Altered sleeping  0 0  Tired, decreased energy  1 1  Change in appetite  0 2  Feeling bad or failure about yourself   0 0  Trouble concentrating  0 0  Moving slowly or fidgety/restless  0 0  Suicidal thoughts  0 0  PHQ-9 Score  5  4      Data saved with a previous flowsheet row definition     The following portions of the patient's history were reviewed and updated as appropriate: allergies, current medications, past family history, past medical history, past social history, past surgical history, and problem list.  Review of Systems  All other systems reviewed and are negative.   Past medical history, past surgical history, family history and social history were all reviewed and documented in the EPIC chart.  Exam:  Vitals:   12/11/24 1605  BP: 118/76  Pulse: 84  SpO2: 97%  Weight: 293 lb (132.9 kg)  Height: 5' 9 (1.753 m)   Body mass  index is 43.27 kg/m.  Physical Exam Vitals and nursing note reviewed. Exam conducted with a chaperone present.  Constitutional:      Appearance: Normal appearance. She is normal weight.  HENT:     Head: Normocephalic and atraumatic.  Neck:     Thyroid : No thyroid  mass, thyromegaly or thyroid  tenderness.  Cardiovascular:     Rate and Rhythm: Regular rhythm.     Heart sounds: Normal heart sounds.  Pulmonary:     Effort: Pulmonary effort is normal.     Breath sounds: Normal breath sounds.  Chest:  Breasts:    Breasts are symmetrical.     Right: Normal. No inverted nipple, mass, nipple discharge, skin change or tenderness.     Left: Normal. No inverted nipple, mass, nipple discharge, skin change or tenderness.  Abdominal:     General: Abdomen is flat. Bowel sounds are normal.     Palpations: Abdomen is soft.  Genitourinary:    General: Normal vulva.     Vagina: Normal. No vaginal discharge, bleeding or lesions.     Cervix: Normal. No discharge or lesion.     Uterus: Normal. Not enlarged and not tender.      Adnexa: Right adnexa normal and left adnexa normal.       Right: No  mass, tenderness or fullness.         Left: No mass, tenderness or fullness.    Lymphadenopathy:     Upper Body:     Right upper body: No axillary adenopathy.     Left upper body: No axillary adenopathy.  Skin:    General: Skin is warm and dry.  Neurological:     Mental Status: She is alert and oriented to person, place, and time.  Psychiatric:        Mood and Affect: Mood normal.        Thought Content: Thought content normal.        Judgment: Judgment normal.      Darice Hoit, CMA present for exam  Assessment/Plan:   1. Well woman exam with routine gynecological exam (Primary) - Cytology - PAP( [place]) - May try panoxyl wash for breakouts on skin  2. Recurrent vaginitis - SureSwab Advanced Vaginitis Plus,TMA  3. Screening for STDs (sexually transmitted diseases) - SureSwab Advanced  Vaginitis Plus,TMA  4. Depression screen     Return in about 1 year (around 12/11/2025) for Annual.  GINETTE COZIER B WHNP-BC 4:25 PM 12/11/2024 "

## 2024-12-11 NOTE — Patient Instructions (Signed)

## 2024-12-12 LAB — SURESWAB® ADVANCED VAGINITIS PLUS,TMA
C. trachomatis RNA, TMA: NOT DETECTED
CANDIDA SPECIES: NOT DETECTED
Candida glabrata: NOT DETECTED
N. gonorrhoeae RNA, TMA: NOT DETECTED
SURESWAB(R) ADV BACTERIAL VAGINOSIS(BV),TMA: NEGATIVE
TRICHOMONAS VAGINALIS (TV),TMA: NOT DETECTED

## 2024-12-12 LAB — CYTOLOGY - PAP
Adequacy: ABSENT
Diagnosis: NEGATIVE

## 2024-12-15 ENCOUNTER — Ambulatory Visit: Payer: Self-pay | Admitting: Radiology
